# Patient Record
Sex: Male | Born: 1935 | Race: White | Hispanic: No | Marital: Single | Smoking: Current some day smoker
Health system: Southern US, Community
[De-identification: ages and names within clinical notes are randomized; demographics above are authoritative.]

## PROBLEM LIST (undated history)

## (undated) DIAGNOSIS — N400 Enlarged prostate without lower urinary tract symptoms: Secondary | ICD-10-CM

## (undated) DIAGNOSIS — R011 Cardiac murmur, unspecified: Secondary | ICD-10-CM

## (undated) DIAGNOSIS — T7840XA Allergy, unspecified, initial encounter: Secondary | ICD-10-CM

## (undated) DIAGNOSIS — E7800 Pure hypercholesterolemia, unspecified: Secondary | ICD-10-CM

## (undated) DIAGNOSIS — I10 Essential (primary) hypertension: Secondary | ICD-10-CM

## (undated) DIAGNOSIS — H269 Unspecified cataract: Secondary | ICD-10-CM

## (undated) HISTORY — DX: Allergy, unspecified, initial encounter: T78.40XA

## (undated) HISTORY — PX: SKIN CANCER EXCISION: SHX779

## (undated) HISTORY — DX: Unspecified cataract: H26.9

## (undated) HISTORY — PX: CYST EXCISION: SHX5701

## (undated) HISTORY — DX: Cardiac murmur, unspecified: R01.1

## (undated) SURGERY — Surgical Case
Anesthesia: *Unknown

---

## 2000-02-06 ENCOUNTER — Ambulatory Visit (HOSPITAL_COMMUNITY): Admission: RE | Admit: 2000-02-06 | Discharge: 2000-02-06 | Payer: Self-pay | Admitting: Neurology

## 2012-02-05 ENCOUNTER — Other Ambulatory Visit: Payer: Self-pay

## 2012-02-05 MED ORDER — ATORVASTATIN CALCIUM 10 MG PO TABS: 10.0000 mg | ORAL_TABLET | Freq: Every day | ORAL | Status: DC

## 2012-02-16 DIAGNOSIS — D1801 Hemangioma of skin and subcutaneous tissue: Secondary | ICD-10-CM | POA: Diagnosis not present

## 2012-02-16 DIAGNOSIS — Z85828 Personal history of other malignant neoplasm of skin: Secondary | ICD-10-CM | POA: Diagnosis not present

## 2012-03-01 ENCOUNTER — Ambulatory Visit (INDEPENDENT_AMBULATORY_CARE_PROVIDER_SITE_OTHER): Payer: Medicare Other | Admitting: Family Medicine

## 2012-03-01 ENCOUNTER — Encounter: Payer: Self-pay | Admitting: Family Medicine

## 2012-03-01 DIAGNOSIS — J309 Allergic rhinitis, unspecified: Secondary | ICD-10-CM | POA: Insufficient documentation

## 2012-03-01 DIAGNOSIS — I10 Essential (primary) hypertension: Secondary | ICD-10-CM

## 2012-03-01 DIAGNOSIS — R059 Cough, unspecified: Secondary | ICD-10-CM

## 2012-03-01 DIAGNOSIS — R05 Cough: Secondary | ICD-10-CM | POA: Diagnosis not present

## 2012-03-01 DIAGNOSIS — J301 Allergic rhinitis due to pollen: Secondary | ICD-10-CM

## 2012-03-01 DIAGNOSIS — E782 Mixed hyperlipidemia: Secondary | ICD-10-CM | POA: Diagnosis not present

## 2012-03-01 DIAGNOSIS — H9190 Unspecified hearing loss, unspecified ear: Secondary | ICD-10-CM

## 2012-03-01 DIAGNOSIS — E785 Hyperlipidemia, unspecified: Secondary | ICD-10-CM | POA: Insufficient documentation

## 2012-03-01 LAB — COMPREHENSIVE METABOLIC PANEL
AST: 22 U/L (ref 0–37)
Albumin: 4.2 g/dL (ref 3.5–5.2)
Alkaline Phosphatase: 50 U/L (ref 39–117)
BUN: 13 mg/dL (ref 6–23)
Potassium: 5 mEq/L (ref 3.5–5.3)
Sodium: 138 mEq/L (ref 135–145)
Total Bilirubin: 1.5 mg/dL — ABNORMAL HIGH (ref 0.3–1.2)
Total Protein: 7 g/dL (ref 6.0–8.3)

## 2012-03-01 LAB — LIPID PANEL
HDL: 43 mg/dL (ref >39)
LDL Cholesterol: 82 mg/dL (ref 0–99)
VLDL: 20 mg/dL (ref 0–40)

## 2012-03-01 MED ORDER — ATORVASTATIN CALCIUM 10 MG PO TABS: 10.0000 mg | ORAL_TABLET | Freq: Every day | ORAL | Status: DC

## 2012-03-01 MED ORDER — AZELASTINE HCL 0.15 % NA SOLN: 1.0000 | Freq: Two times a day (BID) | NASAL | Status: DC | PRN

## 2012-03-01 MED ORDER — LISINOPRIL-HYDROCHLOROTHIAZIDE 10-12.5 MG PO TABS: 1.0000 | ORAL_TABLET | Freq: Every day | ORAL | Status: DC

## 2012-03-01 NOTE — Progress Notes (Signed)
  Subjective:    Patient ID: Naheim Burgen, male    DOB: 03/06/1926, 78 y.o.   MRN: [id number]  HPI Corbet Hanley is a 78 y.o. male HTN - no new side effects.  Slight cough with colds only, cold past few days.  ONo recent outside BP's.  Weight stable.  Has improved diet choices recently.  Hyperlipidemia - stable 02/28/11 - LDL 88.  No new side effects with meds.    Allergic Rhinitis - sneezing occasionally.  prior on Nasonex, doesn't remember filling Astepro. - would like to try., instruced by eye doctor not to use, due to eye [pressure.   Review of Systems  Constitutional: Negative for fatigue and unexpected weight change.  HENT: Positive for postnasal drip.   Respiratory: Positive for cough. Negative for chest tightness and shortness of breath.        Cough with colds/allergies and PND.  Cardiovascular: Negative for chest pain, palpitations and leg swelling.  Gastrointestinal: Negative for abdominal pain and blood in stool.  Musculoskeletal: Negative for myalgias.  Neurological: Negative for dizziness, syncope, light-headedness and headaches.       Objective:   Physical Exam  Constitutional: He is oriented to person, place, and time. He appears well-developed and well-nourished.  HENT:  Head: Normocephalic and atraumatic.  Right Ear: Tympanic membrane, external ear and ear canal normal.  Left Ear: Tympanic membrane, external ear and ear canal normal.  Nose: Mucosal edema present.  Mouth/Throat: Oropharynx is clear and moist.  Eyes: EOM are normal. Pupils are equal, round, and reactive to light.  Neck: No JVD present. Carotid bruit is not present.  Cardiovascular: Normal rate, regular rhythm and normal heart sounds.   No murmur heard. Pulmonary/Chest: Effort normal and breath sounds normal. He has no rales.  Musculoskeletal: He exhibits no edema.  Neurological: He is alert and oriented to person, place, and time.  Skin: Skin is warm and dry.  Psychiatric: He has a  normal mood and affect. His behavior is normal.          Assessment & Plan:  Lavin Petteway is a 78 y.o. male 1. HTN (hypertension)    2. Hyperlipidemia  Comprehensive metabolic panel, Lipid panel  3. Hearing difficulty    4. Allergic rhinitis     Overall stable BP, check lipids and CMP.  Cont same doses meds.   Suspect cough due to A.R., and currently with URI. Doubt Ace-I cough. .  Change Flonase to Astepro (pt to clear with optho 1st). 1-2 sp/nost qd prn.  Re check 6 months, sooner prn, including if cough persists.

## 2012-03-01 NOTE — Patient Instructions (Signed)
Can stop Nasonex, start Astepro 1 to 2 sprays per nostril up to twice per day as needed for allergies/runny nose/cough.  If not improving cough, recheck with me. Continue your other meds for cholesterol and blood pressure.   Allergic Rhinitis Allergic rhinitis is when the mucous membranes in the nose respond to allergens. Allergens are particles in the air that cause your body to have an allergic reaction. This causes you to release allergic antibodies. Through a chain of events, these eventually cause you to release histamine into the blood stream (hence the use of antihistamines). Although meant to be protective to the body, it is this release that causes your discomfort, such as frequent sneezing, congestion and an itchy runny nose.  CAUSES  The pollen allergens may come from grasses, trees, and weeds. This is seasonal allergic rhinitis, or "hay fever." Other allergens cause year-round allergic rhinitis (perennial allergic rhinitis) such as house dust mite allergen, pet dander and mold spores.  SYMPTOMS   Nasal stuffiness (congestion).   Runny, itchy nose with sneezing and tearing of the eyes.   There is often an itching of the mouth, eyes and ears.  It cannot be cured, but it can be controlled with medications. DIAGNOSIS  If you are unable to determine the offending allergen, skin or blood testing may find it. TREATMENT   Avoid the allergen.   Medications and allergy shots (immunotherapy) can help.   Hay fever may often be treated with antihistamines in pill or nasal spray forms. Antihistamines block the effects of histamine. There are over-the-counter medicines that may help with nasal congestion and swelling around the eyes. Check with your caregiver before taking or giving this medicine.  If the treatment above does not work, there are many new medications your caregiver can prescribe. Stronger medications may be used if initial measures are ineffective. Desensitizing injections can be  used if medications and avoidance fails. Desensitization is when a patient is given ongoing shots until the body becomes less sensitive to the allergen. Make sure you follow up with your caregiver if problems continue. SEEK MEDICAL CARE IF:   You develop fever (more than 100.5 F (38.1 C).   You develop a cough that does not stop easily (persistent).   You have shortness of breath.   You start wheezing.   Symptoms interfere with normal daily activities.  Document Released: 09/05/2001 Document Revised: 11/30/2011 Document Reviewed: 03/17/2009 [facility] Patient Information 2012 [place], [place].

## 2012-05-23 DIAGNOSIS — H40059 Ocular hypertension, unspecified eye: Secondary | ICD-10-CM | POA: Diagnosis not present

## 2012-05-23 DIAGNOSIS — H2510 Age-related nuclear cataract, unspecified eye: Secondary | ICD-10-CM | POA: Diagnosis not present

## 2012-08-23 ENCOUNTER — Encounter: Payer: Self-pay | Admitting: Family Medicine

## 2012-08-23 ENCOUNTER — Ambulatory Visit (INDEPENDENT_AMBULATORY_CARE_PROVIDER_SITE_OTHER): Payer: Medicare Other | Admitting: Family Medicine

## 2012-08-23 VITALS — BP 132/90 | HR 93 | Temp 98.0°F | Resp 18 | Ht 67.0 in | Wt 173.0 lb

## 2012-08-23 DIAGNOSIS — E785 Hyperlipidemia, unspecified: Secondary | ICD-10-CM

## 2012-08-23 DIAGNOSIS — N529 Male erectile dysfunction, unspecified: Secondary | ICD-10-CM

## 2012-08-23 DIAGNOSIS — I10 Essential (primary) hypertension: Secondary | ICD-10-CM | POA: Diagnosis not present

## 2012-08-23 MED ORDER — LISINOPRIL-HYDROCHLOROTHIAZIDE 10-12.5 MG PO TABS: 1.0000 | ORAL_TABLET | Freq: Every day | ORAL | Status: DC

## 2012-08-23 MED ORDER — ATORVASTATIN CALCIUM 10 MG PO TABS: 10.0000 mg | ORAL_TABLET | Freq: Every day | ORAL | Status: DC

## 2012-08-23 MED ORDER — SILDENAFIL CITRATE 50 MG PO TABS: 25.0000 mg | ORAL_TABLET | Freq: Every day | ORAL | Status: DC | PRN

## 2012-08-23 NOTE — Progress Notes (Signed)
  Subjective:    Patient ID: Nicholas Finley, male    DOB: [birth year], 78 y.o.   MRN: [id number]  HPI Nicholas Finley is a 78 y.o. male  HTN - no outside blood pressures. No new side effects.  Has not taken viagra, but rx expired.   Hyperlipidemia - last lipids WNL in March.  No new myalgias or side effects with lipitor.    Occasional sneezing - hasn;t needed nasal spray recently.    Feels well overall. Active, walking for exercise including uphill at parks and no chest pain or new symptoms.   Review of Systems  Constitutional: Negative for fatigue and unexpected weight change.  Eyes: Negative for visual disturbance.  Respiratory: Negative for cough, chest tightness and shortness of breath.   Cardiovascular: Negative for chest pain, palpitations and leg swelling.  Gastrointestinal: Negative for nausea, vomiting, abdominal pain and blood in stool.  Skin: Negative for color change.  Neurological: Negative for dizziness, light-headedness and headaches.       Objective:   Physical Exam  Constitutional: He is oriented to person, place, and time. He appears well-developed and well-nourished.  HENT:  Head: Normocephalic and atraumatic.  Eyes: EOM are normal. Pupils are equal, round, and reactive to light.  Neck: No JVD present. Carotid bruit is not present.  Cardiovascular: Normal rate, regular rhythm and normal heart sounds.   No murmur heard. Pulmonary/Chest: Effort normal and breath sounds normal. He has no rales.  Abdominal: Soft. There is no tenderness. There is no guarding.  Musculoskeletal: He exhibits no edema.  Neurological: He is alert and oriented to person, place, and time.  Skin: Skin is warm and dry.  Psychiatric: He has a normal mood and affect.       Assessment & Plan:  Nicholas Finley is a 78 y.o. male 1. HTN (hypertension)  Comprehensive metabolic panel, lisinopril-hydrochlorothiazide (PRINZIDE,ZESTORETIC) 10-12.5 MG per tablet  2. Hyperlipidemia  Lipid  panel, atorvastatin (LIPITOR) 10 MG tablet  3. ED (erectile dysfunction)  sildenafil (VIAGRA) 50 MG tablet   HTN - borderline control, but no new dose changes.  Check outside bp's for next ov.  Hyperlipidemia - controlled prior.  Recheck levels and cmp - recheck bili for stability. meds refilled.   ED - can try low dose Viagra - SED, orthostatic precautions, and CP precautions reviewed. Understanding expressed.   Plan to schedule annual wellness visit in next months.    Patient Instructions  Keep a record of your blood pressures outside of the office and bring them to the next office visit. Schedule annual medicare physical in the next 6 months. Return to the clinic or go to the nearest emergency room if any of your symptoms worsen or new symptoms occur.

## 2012-08-23 NOTE — Patient Instructions (Signed)
Keep a record of your blood pressures outside of the office and bring them to the next office visit. Schedule annual medicare physical in the next 6 months. Return to the clinic or go to the nearest emergency room if any of your symptoms worsen or new symptoms occur.

## 2012-08-24 LAB — COMPREHENSIVE METABOLIC PANEL
ALT: 20 U/L (ref 0–53)
AST: 20 U/L (ref 0–37)
CO2: 24 mEq/L (ref 19–32)
Sodium: 140 mEq/L (ref 135–145)
Total Bilirubin: 1.7 mg/dL — ABNORMAL HIGH (ref 0.3–1.2)
Total Protein: 7.4 g/dL (ref 6.0–8.3)

## 2012-08-24 LAB — LIPID PANEL
Cholesterol: 170 mg/dL (ref 0–200)
LDL Cholesterol: 97 mg/dL (ref 0–99)
Total CHOL/HDL Ratio: 3.8 Ratio
VLDL: 28 mg/dL (ref 0–40)

## 2012-08-30 ENCOUNTER — Ambulatory Visit: Payer: Medicare Other | Admitting: Family Medicine

## 2013-01-21 DIAGNOSIS — H2510 Age-related nuclear cataract, unspecified eye: Secondary | ICD-10-CM | POA: Diagnosis not present

## 2013-01-21 DIAGNOSIS — H40059 Ocular hypertension, unspecified eye: Secondary | ICD-10-CM | POA: Diagnosis not present

## 2013-02-17 DIAGNOSIS — Z85828 Personal history of other malignant neoplasm of skin: Secondary | ICD-10-CM | POA: Diagnosis not present

## 2013-02-17 DIAGNOSIS — L570 Actinic keratosis: Secondary | ICD-10-CM | POA: Diagnosis not present

## 2013-04-21 ENCOUNTER — Other Ambulatory Visit: Payer: Self-pay | Admitting: Family Medicine

## 2013-05-21 ENCOUNTER — Other Ambulatory Visit: Payer: Self-pay | Admitting: Family Medicine

## 2013-05-21 NOTE — Telephone Encounter (Signed)
Needs office visit.

## 2013-07-11 ENCOUNTER — Encounter (HOSPITAL_COMMUNITY): Payer: Self-pay

## 2013-07-11 ENCOUNTER — Emergency Department (HOSPITAL_COMMUNITY)
Admission: EM | Admit: 2013-07-11 | Discharge: 2013-07-11 | Disposition: A | Discharge status: Home / Self Care | Payer: Medicare Other | Attending: Emergency Medicine | Admitting: Emergency Medicine

## 2013-07-11 ENCOUNTER — Emergency Department (HOSPITAL_COMMUNITY): Payer: Medicare Other

## 2013-07-11 DIAGNOSIS — I700 Atherosclerosis of aorta: Secondary | ICD-10-CM | POA: Diagnosis not present

## 2013-07-11 DIAGNOSIS — Z79899 Other long term (current) drug therapy: Secondary | ICD-10-CM | POA: Diagnosis not present

## 2013-07-11 DIAGNOSIS — Z87891 Personal history of nicotine dependence: Secondary | ICD-10-CM | POA: Diagnosis not present

## 2013-07-11 DIAGNOSIS — N3090 Cystitis, unspecified without hematuria: Secondary | ICD-10-CM

## 2013-07-11 DIAGNOSIS — R350 Frequency of micturition: Secondary | ICD-10-CM | POA: Diagnosis not present

## 2013-07-11 DIAGNOSIS — R109 Unspecified abdominal pain: Secondary | ICD-10-CM | POA: Diagnosis not present

## 2013-07-11 DIAGNOSIS — Z87448 Personal history of other diseases of urinary system: Secondary | ICD-10-CM | POA: Diagnosis not present

## 2013-07-11 DIAGNOSIS — I10 Essential (primary) hypertension: Secondary | ICD-10-CM | POA: Insufficient documentation

## 2013-07-11 DIAGNOSIS — Z7982 Long term (current) use of aspirin: Secondary | ICD-10-CM | POA: Insufficient documentation

## 2013-07-11 DIAGNOSIS — E7800 Pure hypercholesterolemia, unspecified: Secondary | ICD-10-CM | POA: Insufficient documentation

## 2013-07-11 HISTORY — DX: Benign prostatic hyperplasia without lower urinary tract symptoms: N40.0

## 2013-07-11 HISTORY — DX: Essential (primary) hypertension: I10

## 2013-07-11 HISTORY — DX: Pure hypercholesterolemia, unspecified: E78.00

## 2013-07-11 LAB — POCT I-STAT, CHEM 8
BUN: 20 mg/dL (ref 6–23)
Hemoglobin: 15.6 g/dL (ref 13.0–17.0)
Potassium: 3.6 mEq/L (ref 3.5–5.1)
Sodium: 138 mEq/L (ref 135–145)
TCO2: 20 mmol/L (ref 0–100)

## 2013-07-11 LAB — URINALYSIS, ROUTINE W REFLEX MICROSCOPIC
Glucose, UA: NEGATIVE mg/dL
Leukocytes, UA: NEGATIVE
Protein, ur: NEGATIVE mg/dL
Specific Gravity, Urine: 1.028 (ref 1.005–1.030)
Urobilinogen, UA: 1 mg/dL (ref 0.0–1.0)

## 2013-07-11 LAB — CBC WITH DIFFERENTIAL/PLATELET
Basophils Absolute: 0 10*3/uL (ref 0.0–0.1)
Basophils Relative: 0 % (ref 0–1)
Eosinophils Absolute: 0 10*3/uL (ref 0.0–0.7)
Eosinophils Relative: 0 % (ref 0–5)
HCT: 43.1 % (ref 39.0–52.0)
Hemoglobin: 14.8 g/dL (ref 13.0–17.0)
MCH: 31.2 pg (ref 26.0–34.0)
MCHC: 34.3 g/dL (ref 30.0–36.0)
MCV: 90.9 fL (ref 78.0–100.0)
Monocytes Absolute: 1.2 10*3/uL — ABNORMAL HIGH (ref 0.1–1.0)
Monocytes Relative: 8 % (ref 3–12)
RDW: 14.1 % (ref 11.5–15.5)

## 2013-07-11 LAB — URINE MICROSCOPIC-ADD ON

## 2013-07-11 MED ORDER — CEPHALEXIN 500 MG PO CAPS: 500.0000 mg | ORAL_CAPSULE | Freq: Four times a day (QID) | ORAL | Status: DC

## 2013-07-11 MED ORDER — CEPHALEXIN 500 MG PO CAPS
500.0000 mg | ORAL_CAPSULE | Freq: Once | ORAL | Status: AC
  Administered 2013-07-11: 500 mg via ORAL
  Filled 2013-07-11: qty 1

## 2013-07-11 MED ORDER — TRAMADOL HCL 50 MG PO TABS: 50.0000 mg | ORAL_TABLET | Freq: Four times a day (QID) | ORAL | Status: DC | PRN

## 2013-07-11 MED ORDER — KETOROLAC TROMETHAMINE 60 MG/2ML IM SOLN
60.0000 mg | Freq: Once | INTRAMUSCULAR | Status: AC
  Administered 2013-07-11: 60 mg via INTRAMUSCULAR
  Filled 2013-07-11: qty 2

## 2013-07-11 NOTE — ED Provider Notes (Signed)
History    CSN: [id number] Arrival date & time 07/11/13  0112  First MD Initiated Contact with Patient 07/11/13 0130     Chief Complaint  Patient presents with  . Flank Pain   (Consider location/radiation/quality/duration/timing/severity/associated sxs/prior Treatment) Patient is a 79 y.o. male presenting with flank pain. The history is provided by the patient. No language interpreter was used.  Flank Pain This is a new problem. The current episode started 2 days ago. The problem occurs constantly. The problem has not changed since onset.Pertinent negatives include no chest pain, no abdominal pain, no headaches and no shortness of breath. Nothing aggravates the symptoms. Nothing relieves the symptoms. He has tried nothing for the symptoms. The treatment provided no relief.   Past Medical History  Diagnosis Date  . Enlarged prostate   . Hypertension   . Hypercholesteremia    Past Surgical History  Procedure Laterality Date  . Skin cancer excision    . Cyst excision     History reviewed. No pertinent family history. History  Substance Use Topics  . Smoking status: Former Smoker    Types: Cigarettes, Pipe, Software engineer  . Smokeless tobacco: Not on file  . Alcohol Use: No    Review of Systems  Respiratory: Negative for shortness of breath.   Cardiovascular: Negative for chest pain.  Gastrointestinal: Negative for abdominal pain.  Genitourinary: Positive for frequency and flank pain.  Neurological: Negative for headaches.  All other systems reviewed and are negative.    Allergies  Review of patient's allergies indicates no known allergies.  Home Medications   Current Outpatient Rx  Name  Route  Sig  Dispense  Refill  . aspirin 81 MG tablet   Oral   Take 81 mg by mouth daily.         Marland Kitchen atorvastatin (LIPITOR) 10 MG tablet      take 1 tablet by mouth once daily   90 tablet   0     Needs office visit   . lisinopril-hydrochlorothiazide (PRINZIDE,ZESTORETIC)  10-12.5 MG per tablet      take 1 tablet by mouth once daily   90 tablet   0     Needs office visit   . sildenafil (VIAGRA) 50 MG tablet   Oral   Take 25 mg by mouth daily as needed for erectile dysfunction.         . ASTEPRO 0.15 % SOLN      instill 1 spray into each nostril twice a day if needed   30 mL   3    BP 172/101  Pulse 88  Temp(Src) 98.7 F (37.1 C)  Resp 20  SpO2 98% Physical Exam  Constitutional: He is oriented to person, place, and time. He appears well-developed and well-nourished. No distress.  HENT:  Head: Normocephalic and atraumatic.  Mouth/Throat: Oropharynx is clear and moist.  Eyes: Conjunctivae are normal. Pupils are equal, round, and reactive to light.  Neck: Normal range of motion. Neck supple.  Cardiovascular: Normal rate, regular rhythm and intact distal pulses.   Pulmonary/Chest: Effort normal and breath sounds normal. He has no wheezes. He has no rales.  Abdominal: Soft. Bowel sounds are normal. There is no tenderness. There is no rebound and no guarding.  Musculoskeletal: Normal range of motion.  Neurological: He is alert and oriented to person, place, and time.  Skin: Skin is warm and dry.  Psychiatric: He has a normal mood and affect.    ED Course  Procedures (including critical  care time) Labs Reviewed  CBC WITH DIFFERENTIAL - Abnormal; Notable for the following:    WBC 14.5 (*)    Neutro Abs 8.1 (*)    Lymphs Abs 5.1 (*)    Monocytes Absolute 1.2 (*)    All other components within normal limits  URINALYSIS, ROUTINE W REFLEX MICROSCOPIC - Abnormal; Notable for the following:    Hgb urine dipstick SMALL (*)    All other components within normal limits  URINE MICROSCOPIC-ADD ON - Abnormal; Notable for the following:    Bacteria, UA FEW (*)    All other components within normal limits  POCT I-STAT, CHEM 8 - Abnormal; Notable for the following:    Glucose, Bld 137 (*)    Calcium, Ion 0.97 (*)    All other components within  normal limits   No results found. No diagnosis found.  MDM  Will treat for cystitis.  Follow up with your doctor and urology as needed for ongoing care.  Return for fevers vomiting or worsening pain.  Patient verbalizes understanding and agrees to follow up  Laverda Stribling Smitty Cords, MD 07/11/13 1610

## 2013-07-11 NOTE — ED Notes (Signed)
Per pt, left flank pain x 2 days.  Unknown muscle strain.  No hx of stones.  Possible increase in urination.  Pt also has prostate hx.  No fever, n/v.

## 2013-07-17 DIAGNOSIS — R39198 Other difficulties with micturition: Secondary | ICD-10-CM | POA: Diagnosis not present

## 2013-07-17 DIAGNOSIS — R351 Nocturia: Secondary | ICD-10-CM | POA: Diagnosis not present

## 2013-07-17 DIAGNOSIS — M549 Dorsalgia, unspecified: Secondary | ICD-10-CM | POA: Diagnosis not present

## 2013-08-29 ENCOUNTER — Ambulatory Visit: Payer: Medicare Other

## 2013-08-29 ENCOUNTER — Ambulatory Visit (INDEPENDENT_AMBULATORY_CARE_PROVIDER_SITE_OTHER): Payer: Medicare Other | Admitting: Family Medicine

## 2013-08-29 VITALS — BP 142/70 | HR 94 | Temp 98.9°F | Resp 17 | Ht 66.5 in | Wt 177.0 lb

## 2013-08-29 DIAGNOSIS — S9030XA Contusion of unspecified foot, initial encounter: Secondary | ICD-10-CM | POA: Diagnosis not present

## 2013-08-29 DIAGNOSIS — S93409A Sprain of unspecified ligament of unspecified ankle, initial encounter: Secondary | ICD-10-CM | POA: Diagnosis not present

## 2013-08-29 DIAGNOSIS — M25561 Pain in right knee: Secondary | ICD-10-CM

## 2013-08-29 DIAGNOSIS — S8000XA Contusion of unspecified knee, initial encounter: Secondary | ICD-10-CM

## 2013-08-29 DIAGNOSIS — M25569 Pain in unspecified knee: Secondary | ICD-10-CM | POA: Diagnosis not present

## 2013-08-29 DIAGNOSIS — S8001XA Contusion of right knee, initial encounter: Secondary | ICD-10-CM

## 2013-08-29 DIAGNOSIS — S9031XA Contusion of right foot, initial encounter: Secondary | ICD-10-CM

## 2013-08-29 NOTE — Patient Instructions (Signed)
Ankle brace as needed for next week. Be careful with twisting or any quick movements.ice or heat to affected areas if needed. Recheck with Dr. Cleta Alberts next week. Return to the clinic or go to the nearest emergency room if any of your symptoms worsen or new symptoms occur.

## 2013-08-29 NOTE — Progress Notes (Signed)
Subjective:    Patient ID: Nicholas Finley, male    DOB: 10-29-1926, 79 y.o.   MRN: [id number]  HPI Nicholas Finley is a 79 y.o. male  Larey Seat onto R knee while walking up steps 10 days ago. Lost footing. Fell onto floor - ?twisted or struck R knee.  Able to walk ok without soreness until next day. Noticed bruising into foot a day or two later. Knee still feels swollen in the front. Tx: hot water bottle to outside of knee at times.   Slight bruising into toes, but not hurting in foot. Able to walk without difficulty. On asa qd.   R ankle swollen, slightly sore about 2 days after fall. Able to walk without difficulty. No hip pain.   Review of Systems  Musculoskeletal: Positive for joint swelling and arthralgias. Negative for gait problem.  Skin: Positive for color change (R foot. ).      Objective:   Physical Exam  Vitals reviewed. Constitutional: He is oriented to person, place, and time. He appears well-developed and well-nourished. No distress.  Pulmonary/Chest: Effort normal.  Musculoskeletal:       Right hip: He exhibits normal range of motion, normal strength and no tenderness.       Left hip: He exhibits normal range of motion, normal strength and no tenderness.       Right knee: He exhibits swelling (rounded soft tissue swelling.anterior to tibial tuberosity. no apparent joint effusion. ). He exhibits normal range of motion, no LCL laxity, normal patellar mobility and no MCL laxity. Tenderness found. Lateral joint line (minimal ttp anterior aspect lateral jt. line ) tenderness noted. No MCL, no LCL and no patellar tendon (intact - able to hold knee in extension. ) tenderness noted.       Right ankle: He exhibits normal range of motion and no deformity. Tenderness. Lateral malleolus (proximal aspect, sts inferior to malleolus. ) tenderness found. No medial malleolus, no head of 5th metatarsal and no proximal fibula tenderness found. Achilles tendon normal. Achilles tendon exhibits  no pain and no defect.       Right lower leg: He exhibits bony tenderness (disatl tibia approx 3-4 cm proximal to malleolus. fibula nt. ) and swelling (calf nt, achilles nt. ).       Right foot: He exhibits normal range of motion, no tenderness and no bony tenderness.       Feet:  Neurological: He is alert and oriented to person, place, and time.  nvi distally.   Skin: Skin is warm, dry and intact.     Psychiatric: He has a normal mood and affect. His behavior is normal.     [facility] reading (PRIMARY) by  Dr. Neva Seat: R ankle: NAD, no apparent fx R. Knee: NAD.  Degenerative changes.  R tib fib: NAD, no apparent fx.  R foot: no apparent fx.      Assessment & Plan:  Nicholas Finley is a 79 y.o. male Pain in joint, lower leg, right - Plan: DG Knee Complete 4 Views Right, DG Tibia/Fibula Right, DG Ankle Complete Right, DG Foot Complete Right  Knee contusion, right, initial encounter  Traumatic ecchymosis of foot, right, initial encounter  Sprain of ankle, unspecified site  Suspected R knee contusion, then dependent ecchymosis to foot. Also some medial ankle ttp - mild sprain possible.  Able to weight bear without difficulty.  No apparent fx on xray, but will have rechecked in 1 week with Dr. Cleta Alberts as I will be out of town. RTC  precautions given. sweedo brace as needed. Tylenol if needed.   Patient Instructions  Ankle brace as needed for next week. Be careful with twisting or any quick movements.ice or heat to affected areas if needed. Recheck with Dr. Cleta Alberts next week. Return to the clinic or go to the nearest emergency room if any of your symptoms worsen or new symptoms occur.

## 2013-09-05 ENCOUNTER — Ambulatory Visit (INDEPENDENT_AMBULATORY_CARE_PROVIDER_SITE_OTHER): Payer: Medicare Other | Admitting: Emergency Medicine

## 2013-09-05 VITALS — BP 128/72 | HR 83 | Temp 98.6°F | Resp 17 | Ht 66.5 in | Wt 178.0 lb

## 2013-09-05 DIAGNOSIS — Z23 Encounter for immunization: Secondary | ICD-10-CM | POA: Diagnosis not present

## 2013-09-05 DIAGNOSIS — IMO0002 Reserved for concepts with insufficient information to code with codable children: Secondary | ICD-10-CM

## 2013-09-05 DIAGNOSIS — S8001XS Contusion of right knee, sequela: Secondary | ICD-10-CM

## 2013-09-05 NOTE — Progress Notes (Signed)
  Subjective:    Patient ID: Nicholas Finley, male    DOB: [birth year], 79 y.o.   MRN: [id number]  HPI patient here to followup injury to his right leg with contusion to his knee foot and ankle. He is doing significantly better. X-rays done of this entire area did not reveal any signs of fracture. He has significant swelling over the tibial tuberosity    Review of Systems     Objective:   Physical Exam e area is becoming more fluctuant. The bruising noted in the ankle and foot is essentially resolved. xamination of the leg reveals significant swelling over the tibial tuberosity. There is minimal redness noted.         Assessment & Plan:  Patient doing well regarding contusion to his right leg especially over the tibial tuberosity. Do not feel any specific treatment is indicated at the present time. Will recheck if he develops any redness or worsening swelling of the area.

## 2013-09-17 DIAGNOSIS — H40059 Ocular hypertension, unspecified eye: Secondary | ICD-10-CM | POA: Diagnosis not present

## 2013-12-17 ENCOUNTER — Other Ambulatory Visit: Payer: Self-pay | Admitting: Physician Assistant

## 2013-12-17 NOTE — Telephone Encounter (Signed)
Spoke to pt. He will be in to see Dr. Ulla Gallo 2

## 2013-12-26 ENCOUNTER — Ambulatory Visit (INDEPENDENT_AMBULATORY_CARE_PROVIDER_SITE_OTHER): Payer: Medicare Other | Admitting: Family Medicine

## 2013-12-26 ENCOUNTER — Encounter: Payer: Self-pay | Admitting: Family Medicine

## 2013-12-26 VITALS — BP 124/80 | HR 87 | Temp 99.0°F | Resp 16 | Ht 66.5 in | Wt 180.0 lb

## 2013-12-26 DIAGNOSIS — I10 Essential (primary) hypertension: Secondary | ICD-10-CM

## 2013-12-26 DIAGNOSIS — E785 Hyperlipidemia, unspecified: Secondary | ICD-10-CM

## 2013-12-26 LAB — LIPID PANEL
CHOLESTEROL: 163 mg/dL (ref 0–200)
HDL: 44 mg/dL (ref >39)
LDL Cholesterol: 88 mg/dL (ref 0–99)
TRIGLYCERIDES: 153 mg/dL — AB (ref <150)
Total CHOL/HDL Ratio: 3.7 Ratio
VLDL: 31 mg/dL (ref 0–40)

## 2013-12-26 LAB — COMPREHENSIVE METABOLIC PANEL
ALBUMIN: 4.2 g/dL (ref 3.5–5.2)
ALT: 21 U/L (ref 0–53)
AST: 18 U/L (ref 0–37)
Alkaline Phosphatase: 50 U/L (ref 39–117)
BILIRUBIN TOTAL: 1.6 mg/dL — AB (ref 0.3–1.2)
BUN: 13 mg/dL (ref 6–23)
CALCIUM: 9.2 mg/dL (ref 8.4–10.5)
CHLORIDE: 102 meq/L (ref 96–112)
CO2: 25 meq/L (ref 19–32)
Creat: 0.75 mg/dL (ref 0.50–1.35)
GLUCOSE: 119 mg/dL — AB (ref 70–99)
POTASSIUM: 3.7 meq/L (ref 3.5–5.3)
SODIUM: 140 meq/L (ref 135–145)
TOTAL PROTEIN: 7.4 g/dL (ref 6.0–8.3)

## 2013-12-26 MED ORDER — ATORVASTATIN CALCIUM 10 MG PO TABS: ORAL_TABLET | ORAL | Status: DC

## 2013-12-26 MED ORDER — LISINOPRIL-HYDROCHLOROTHIAZIDE 10-12.5 MG PO TABS: ORAL_TABLET | ORAL | Status: DC

## 2013-12-26 NOTE — Patient Instructions (Signed)
We will call you to schedule physical. You should receive a call or letter about your lab results within the next week to 10 days.

## 2013-12-26 NOTE — Progress Notes (Addendum)
Subjective:    Patient ID: Nicholas Finley, male    DOB: November 15, 1926, 80 y.o.   MRN: [id number] This chart was scribed for Merri Ray, MD by Vernell Barrier, Medical Scribe. This patient's care was started at 8:45 AM.  HPI HPI Comments: Nicholas Finley is a 80 y.o. male who presents to the [facility] for refills of medication for HTN and hyperlipidemia. Pt states he has enough medication for about 30 days. Pt denies any associated myalgias/pain or side effects from HTN or cholesterol medication. Pt has not had anything to eat or drink in the last 14 hours. Denies light-headedness, chest pain, belly pain, hematochezia, weakness, SOB, or dizziness.   HTN: Last creatinine: 0.78 in Aug of 2013.   Hyperlipidemia: Last lipids in Aug of 2013 were controlled. Did note elevated bilirubin at 1.7 at that office visit. No abd pain.    Patient Active Problem List   Diagnosis Date Noted  . HTN (hypertension) 03/01/2012  . Hyperlipidemia 03/01/2012  . Hearing difficulty 03/01/2012  . Allergic rhinitis 03/01/2012   Past Medical History  Diagnosis Date  . Enlarged prostate   . Hypertension   . Hypercholesteremia   . Allergy   . Cataract    Past Surgical History  Procedure Laterality Date  . Skin cancer excision    . Cyst excision     No Known Allergies Prior to Admission medications   Medication Sig Start Date End Date Taking? Authorizing Provider  aspirin 81 MG tablet Take 81 mg by mouth daily.   Yes Historical Provider, MD  ASTEPRO 0.15 % SOLN instill 1 spray into each nostril twice a day if needed 04/21/13  Yes Heather M Marte, PA-C  atorvastatin (LIPITOR) 10 MG tablet take 1 tablet by mouth once daily 05/21/13  Yes Ryan M Dunn, PA-C  lisinopril-hydrochlorothiazide (PRINZIDE,ZESTORETIC) 10-12.5 MG per tablet take 1 tablet by mouth once daily 12/17/13  Yes Sarah Alleen Borne, PA-C  cephALEXin (KEFLEX) 500 MG capsule Take 1 capsule (500 mg total) by mouth 4 (four)  times daily. 07/11/13   April K Palumbo-Rasch, MD  sildenafil (VIAGRA) 50 MG tablet Take 25 mg by mouth daily as needed for erectile dysfunction.    Historical Provider, MD  traMADol (ULTRAM) 50 MG tablet Take 1 tablet (50 mg total) by mouth every 6 (six) hours as needed for pain. 07/11/13   April Alfonso Patten, MD   History   Social History  . Marital Status: Single    Spouse Name: N/A    Number of Children: N/A  . Years of Education: N/A   Occupational History  . Not on file.   Social History Main Topics  . Smoking status: Former Smoker    Types: Cigarettes, Pipe, Landscape architect  . Smokeless tobacco: Not on file  . Alcohol Use: No  . Drug Use: No  . Sexual Activity: Not on file   Other Topics Concern  . Not on file   Social History Narrative  . No narrative on file    Review of Systems  Constitutional: Negative for fatigue and unexpected weight change.  Eyes: Negative for visual disturbance.  Respiratory: Negative for cough, chest tightness and shortness of breath.   Cardiovascular: Negative for chest pain, palpitations and leg swelling.  Gastrointestinal: Negative for abdominal pain and blood in stool.  Neurological: Negative for dizziness, weakness, light-headedness and headaches.       Objective:   Physical Exam  Vitals reviewed. Constitutional: He is oriented to person, place,  and time. He appears well-developed and well-nourished.  HENT:  Head: Normocephalic and atraumatic.  Eyes: EOM are normal. Pupils are equal, round, and reactive to light.  Neck: No JVD present. Carotid bruit is not present.  Cardiovascular: Normal rate and regular rhythm.   Murmur heard. 2 out of 6 heart murmur at LUSB  Pulmonary/Chest: Effort normal and breath sounds normal. He has no rales.  Musculoskeletal: He exhibits no edema.  Neurological: He is alert and oriented to person, place, and time.  Skin: Skin is warm and dry.  Psychiatric: He has a normal mood and affect.    Filed  Vitals:   12/26/13 0817  BP: 124/80  Pulse: 87  Temp: 99 F (37.2 C)  TempSrc: Oral  Resp: 16  Height: 5' 6.5" (1.689 m)  Weight: 180 lb (81.647 kg)  SpO2: 95%      Assessment & Plan:   Nicholas Finley is a 80 y.o. male Hypertension - Plan: Comprehensive metabolic panel, Lipid panel, lisinopril-hydrochlorothiazide (PRINZIDE,ZESTORETIC) 10-12.5 MG per tablet - controlled. Refilled meds, check CMP.   Hyperlipidemia - Plan: Comprehensive metabolic panel, Lipid panel, atorvastatin (LIPITOR) 10 MG tablet -prior controlled. Cont Lipitor at same dose. Lipids pending.   Plan on scheduling medicare physical. He also plans to check into part D coverage.   Meds ordered this encounter  Medications  . lisinopril-hydrochlorothiazide (PRINZIDE,ZESTORETIC) 10-12.5 MG per tablet    Sig: take 1 tablet by mouth once daily    Dispense:  90 tablet    Refill:  1  . atorvastatin (LIPITOR) 10 MG tablet    Sig: take 1 tablet by mouth once daily    Dispense:  90 tablet    Refill:  1   Patient Instructions  We will call you to schedule physical. You should receive a call or letter about your lab results within the next week to 10 days.

## 2013-12-31 ENCOUNTER — Telehealth: Payer: Self-pay | Admitting: Family Medicine

## 2013-12-31 NOTE — Telephone Encounter (Signed)
Appt has been made for April 06, 2014.

## 2013-12-31 NOTE — Telephone Encounter (Signed)
Message copied by Chinita Pester on Wed Dec 31, 2013  8:29 AM ------      Message from: [place], [place] R      Created: Fri Dec 26, 2013  8:59 AM       Please schedule with Carlota Raspberry in next 3-6 months for physical (medicare physical - 6min).  ------

## 2014-02-16 DIAGNOSIS — L819 Disorder of pigmentation, unspecified: Secondary | ICD-10-CM | POA: Diagnosis not present

## 2014-02-16 DIAGNOSIS — L570 Actinic keratosis: Secondary | ICD-10-CM | POA: Diagnosis not present

## 2014-02-16 DIAGNOSIS — L820 Inflamed seborrheic keratosis: Secondary | ICD-10-CM | POA: Diagnosis not present

## 2014-02-16 DIAGNOSIS — D1801 Hemangioma of skin and subcutaneous tissue: Secondary | ICD-10-CM | POA: Diagnosis not present

## 2014-02-16 DIAGNOSIS — L821 Other seborrheic keratosis: Secondary | ICD-10-CM | POA: Diagnosis not present

## 2014-02-16 DIAGNOSIS — Z85828 Personal history of other malignant neoplasm of skin: Secondary | ICD-10-CM | POA: Diagnosis not present

## 2014-04-06 ENCOUNTER — Encounter: Payer: Self-pay | Admitting: Family Medicine

## 2014-04-06 ENCOUNTER — Ambulatory Visit (INDEPENDENT_AMBULATORY_CARE_PROVIDER_SITE_OTHER): Payer: Medicare Other | Admitting: Family Medicine

## 2014-04-06 VITALS — BP 120/74 | HR 83 | Temp 98.9°F | Resp 16 | Ht 66.0 in | Wt 177.6 lb

## 2014-04-06 DIAGNOSIS — R4182 Altered mental status, unspecified: Secondary | ICD-10-CM

## 2014-04-06 DIAGNOSIS — R6250 Unspecified lack of expected normal physiological development in childhood: Secondary | ICD-10-CM

## 2014-04-06 DIAGNOSIS — R739 Hyperglycemia, unspecified: Secondary | ICD-10-CM

## 2014-04-06 DIAGNOSIS — Z0000 Encounter for general adult medical examination without abnormal findings: Secondary | ICD-10-CM

## 2014-04-06 DIAGNOSIS — R7309 Other abnormal glucose: Secondary | ICD-10-CM | POA: Diagnosis not present

## 2014-04-06 DIAGNOSIS — I10 Essential (primary) hypertension: Secondary | ICD-10-CM | POA: Diagnosis not present

## 2014-04-06 DIAGNOSIS — H9190 Unspecified hearing loss, unspecified ear: Secondary | ICD-10-CM

## 2014-04-06 DIAGNOSIS — E785 Hyperlipidemia, unspecified: Secondary | ICD-10-CM

## 2014-04-06 LAB — GLUCOSE, POCT (MANUAL RESULT ENTRY): POC Glucose: 89 mg/dl (ref 70–99)

## 2014-04-06 LAB — POCT GLYCOSYLATED HEMOGLOBIN (HGB A1C): Hemoglobin A1C: 6.2

## 2014-04-06 NOTE — Patient Instructions (Addendum)
I would recommend continuing your Lipitor as long as you are not having any side effects. You may not necessarily need to get the shingles vaccine at this point as it may be less effective and due to cost. Work on removing some of the loose items that could cause tripping at home and if needed, a handle near the shower for stability. Also recommend getting items that are on high shelves requiring ladder use on lower shelves to decreased risk of falls. I will refer you to the Neurologist to discuss the episode you had a few months ago, but if any of these symptoms return, come back to the office or go to the Emergency Room.   Your blood sugar test was normal in the office today, but a three month average was slightly elevated. No new medications at this point but continue to watch diet and walk for exercise. Recheck with Dr. Carlota Raspberry in the next 3-6 months, sooner if anything worsens. We will likely discuss urination symptoms next visit and should have the other pneumonia vaccine for you then.    Keeping you healthy  Get these tests  Blood pressure- Have your blood pressure checked once a year by your healthcare provider.  Normal blood pressure is 120/80  Weight- Have your body mass index (BMI) calculated to screen for obesity.  BMI is a measure of body fat based on height and weight. You can also calculate your own BMI at [email or web address].  Cholesterol- Have your cholesterol checked every year.  Diabetes- Have your blood sugar checked regularly if you have high blood pressure, high cholesterol, have a family history of diabetes or if you are overweight.  Screening for Colon Cancer- Colonoscopy starting at age 59.  Screening may begin sooner depending on your family history and other health conditions. Follow up colonoscopy as directed by your Gastroenterologist. Take these medicines  Aspirin- One aspirin daily can help prevent Heart disease and Stroke.  Flu shot- Every fall.  Tetanus-  Every 10 years. - I would recommend a "TDAP" but check with your insurance as you may need to pay for this out of pocket.   Pneumonia shot- Once after the age of 14; if you are younger than 78, ask your healthcare provider if you need a Pneumonia shot.  Take these steps  Don't smoke- If you do smoke, talk to your doctor about quitting.  For tips on how to quit, go to www.smokefree.gov or call 1-800-QUIT-NOW.  Be physically active- Exercise 5 days a week for at least 30 minutes.  If you are not already physically active start slow and gradually work up to 30 minutes of moderate physical activity.  Examples of moderate activity include walking briskly, mowing the yard, dancing, swimming, bicycling, etc.  Eat a healthy diet- Eat a variety of healthy food such as fruits, vegetables, low fat milk, low fat cheese, yogurt, lean meant, poultry, fish, beans, tofu, etc. For more information go to www.thenutritionsource.org  Drink alcohol in moderation- Limit alcohol intake to less than two drinks a day. Never drink and drive.  Dentist- Brush and floss twice daily; visit your dentist twice a year.  Depression- Your emotional health is as important as your physical health. If you're feeling down, or losing interest in things you would normally enjoy please talk to your healthcare provider.  Eye exam- Visit your eye doctor every year.  Safe sex- If you may be exposed to a sexually transmitted infection, use a condom.  Seat belts- Seat belts can  save your life; always wear one.  Smoke/Carbon Monoxide detectors- These detectors need to be installed on the appropriate level of your home.  Replace batteries at least once a year.  Skin cancer- When out in the sun, cover up and use sunscreen 15 SPF or higher.  Violence- If anyone is threatening you, please tell your healthcare provider.  Living Will/ Health care power of attorney- Speak with your healthcare provider and family.

## 2014-04-06 NOTE — Progress Notes (Addendum)
Subjective:    Patient ID: Nicholas Finley, male    DOB: 1926-03-16, 80 y.o.   MRN: [id number]  This chart was scribed for [name], MD by Maree Erie, ED Scribe.  Authored by Janeann Forehand. MD - unable to change in [facility].   Chief Complaint  Patient presents with   Annual Exam    AWV-S - per patient no refills needed    PCP: No primary provider on file.   HPI  Nicholas Finley is a 80 y.o. male who presents to office for a subsequent Medicare Annual Wellness visit.  History of HTN and Hyperlipidemia: Takes Lisinopril-HCTZ and Lipitor, as well as allergic rhinitis with episodic Astepro. Most recent labs in January, 2015, with stable cholesterol.   Hyperglycemia: borderline glucose at 119. Glucose was also elevated at 137 eight months ago. Will follow up at the next visit.    Health Maintenance:   Pneumonia Vaccine: 07/2007, will repeat at next visit   Hearing Screening: Patient has history of hearing difficulty. He has an OTC amplifier. He states its "better than nothing." He has been seen by an Audiologist who did not recommend the hear   Depression Screening: 0/2 on PHQ2 score; Beck depression score of 8 (normal ups and downs)   Fall Screening: 1 fall in the past year with injury to right knee and ankle, See notes from September, 2014. Lost footing while walking up steps. Otherwise stable with walking and no other falls. He states that the injuries are healing well but has occasional residual soreness. Home safety evaluation, see scanned copy. Few borderline responses, including things to step over in the home in almost every room, sometimes concerned may be slipping in shower when getting out, rare difficulty with steps or stairs, rare unsteadiness on gravel or uneven surfaces and sometimes uses stepstool to obtain things out of reach. He has a towel rack in his bathroom but is unsure if it is stable enough to catch him. He also has a railing near his tub that he got  for his mother. However, he states he doesn't use it.   Tetanus: 07/2007, TD   Vision Screening: see below   Visual Acuity Screening   Right eye Left eye Both eyes  Without correction:     With correction: 20/50 20/30 20/40      Colonoscopy: within the past two years   Prostate Screening: Last PSA test in 2011, normal. He states he is ok with stopping the screening. He reports frequency and urgency that has been gradually getting worse for about 15-20 years. He has to get up a couple times a night every night, which is worsened if he drinks too much. It has not changed much in the past few years. It does not affect his ability to function during the day. He denies dysuria or hematuria.    Advanced Directives: He has a living will in his trust. He states his cousin is going to be making decisions if he is unable to make medical decisions for himself. He states that in his living will he is full code.    Zoster Vaccine: He is not interested in getting the vaccination currently because he does not believe it is covered by his insurance and it is too expensive. He will look into getting it covered so he can get it   History of Basal Cell Carcinoma: He has had a few spots frozen on his head. He was last seen by Dermatology early 2015.  Social History: He states he smoked cigars for 40 years but quit in 2006.   Memory Loss: He denies any decrease in functioning in life. He is sometimes unable to remember names of people or places for a few minutes. He is able to balance his checkbook, grocery shop, pay bills and other daily tasks. He states that about a month ago he "went out" for about twenty seconds and does not remember what he said to a friend. He states that it felt like he was in a trace but has no memory of the event. He believes it looked like he was staring into space. It was during the middle of the day. He denies a prior similar episode. He denies headaches, dizziness, lightheadedness or  focal weakness. He denies a history of seizures. He takes a 81 mg aspirin daily.    Patient Active Problem List   Diagnosis Date Noted   HTN (hypertension) 03/01/2012   Hyperlipidemia 03/01/2012   Hearing difficulty 03/01/2012   Allergic rhinitis 03/01/2012   Past Medical History  Diagnosis Date   Enlarged prostate    Hypertension    Hypercholesteremia    Allergy    Cataract    Heart murmur    Past Surgical History  Procedure Laterality Date   Skin cancer excision     Cyst excision      pilonidal   No Known Allergies Prior to Admission medications   Medication Sig Start Date End Date Taking? Authorizing Provider  aspirin 81 MG tablet Take 81 mg by mouth daily.    Historical Provider, MD  ASTEPRO 0.15 % SOLN instill 1 spray into each nostril twice a day if needed 04/21/13   Collene Leyden, PA-C  atorvastatin (LIPITOR) 10 MG tablet take 1 tablet by mouth once daily 12/26/13   [name], MD  cephALEXin (KEFLEX) 500 MG capsule Take 1 capsule (500 mg total) by mouth 4 (four) times daily. 07/11/13   April Alfonso Patten, MD  lisinopril-hydrochlorothiazide [facility]) 10-12.5 MG per tablet take 1 tablet by mouth once daily 12/26/13   [name], MD  sildenafil (VIAGRA) 50 MG tablet Take 25 mg by mouth daily as needed for erectile dysfunction.    Historical Provider, MD  traMADol (ULTRAM) 50 MG tablet Take 1 tablet (50 mg total) by mouth every 6 (six) hours as needed for pain. 07/11/13   April Alfonso Patten, MD   History   Social History   Marital Status: Single    Spouse Name: N/A    Number of Children: N/A   Years of Education: N/A   Occupational History   Not on file.   Social History Main Topics   Smoking status: Former Smoker    Types: Cigarettes, Pipe, Cigars   Smokeless tobacco: Not on file   Alcohol Use: No   Drug Use: No   Sexual Activity: Not on file   Other Topics Concern   Not on file   Social History Narrative    No narrative on file      Review of Systems  Constitutional: Negative for fever and chills.  HENT: Negative for rhinorrhea.   Eyes: Negative for visual disturbance.  Respiratory: Negative for cough and shortness of breath.   Cardiovascular: Negative for chest pain and leg swelling.  Gastrointestinal: Negative for nausea, vomiting, abdominal pain and diarrhea.  Genitourinary: Positive for urgency and frequency. Negative for dysuria and hematuria.  Musculoskeletal: Negative for back pain.  Skin: Negative for rash.  Neurological: Negative  for dizziness, weakness, light-headedness and headaches.  Psychiatric/Behavioral: Negative for confusion.       Objective:   Physical Exam  Nursing note and vitals reviewed. Constitutional: He is oriented to person, place, and time. He appears well-developed and well-nourished. No distress.  HENT:  Head: Normocephalic and atraumatic.  Eyes: EOM are normal.  Neck: Neck supple. No tracheal deviation present.  Cardiovascular: Normal rate and regular rhythm.  Exam reveals no gallop and no friction rub.   No murmur heard. Pulmonary/Chest: Effort normal and breath sounds normal. No respiratory distress. He has no wheezes. He has no rales.  Musculoskeletal: Normal range of motion. He exhibits no edema.  No swelling of lower extremities.   Neurological: He is alert and oriented to person, place, and time. No cranial nerve deficit.  Romberg negative. No pronator drift. Heel to toe overall normal, stumbled at last step.  Skin: Skin is warm and dry.  Multiple hyperpigmented patches with few dry scaly areas over dorsal forearms.   Psychiatric: He has a normal mood and affect. His behavior is normal.    Filed Vitals:   04/06/14 1325  BP: 120/74  Pulse: 83  Temp: 98.9 F (37.2 C)  TempSrc: Oral  Resp: 16  Height: 5\' 6"  (1.676 m)  Weight: 177 lb 9.6 oz (80.559 kg)  SpO2: 94%    Results for orders placed in visit on 04/06/14  GLUCOSE, POCT (MANUAL  RESULT ENTRY)      Result Value Ref Range   POC Glucose 89  70 - 99 mg/dl  POCT GLYCOSYLATED HEMOGLOBIN (HGB A1C)      Result Value Ref Range   Hemoglobin A1C 6.2           Assessment & Plan:  Nicholas Finley is a 80 y.o. male Routine general medical examination at a health care facility - annual [facility] wellness exam. -see pt instructions below. Advised to decr risk of fall with common household obstructions and shower safety, and avoiding ladders.  -Advanced directives discussed. Has living will and son in law is POA.   -d/t cost and decr effectiveness - will defer zostavax.  -checking into TDAP coverage - recommended.  -declined prostate CA screening. Likely will not need further colonoscopy - can discuss with GI. Hearing - if desires - can refer to audiologist - using otc assistive device now.   Hyperglycemia - Plan: POCT glucose (manual entry), POCT glycosylated hemoglobin (Hb A1C) -prediabetes. No new meds. Plans on walking for exercise and watch diet.   High blood pressure - stable on current regimen.   Mental status change - Plan: Ambulatory referral to Neurology. Does not sound like true syncope.  Single episode without chest sx's.  Less likely seizure, but will refer to neuro to eval for further w/u. Rtc/er precautions.   Hearing difficulty - as above.   Other and unspecified hyperlipidemia - will continue lipitor as no intolerances at this point and CV rf of HTN. .    No orders of the defined types were placed in this encounter.   Patient Instructions  I would recommend continuing your Lipitor as long as you are not having any side effects. You may not necessarily need to get the shingles vaccine at this point as it may be less effective and due to cost. Work on removing some of the loose items that could cause tripping at home and if needed, a handle near the shower for stability. Also recommend getting items that are on high shelves requiring ladder use  on lower shelves  to decreased risk of falls. I will refer you to the Neurologist to discuss the episode you had a few months ago, but if any of these symptoms return, come back to the office or go to the Emergency Room.   Your blood sugar test was normal in the office today, but a three month average was slightly elevated. No new medications at this point but continue to watch diet and walk for exercise. Recheck with Dr. Carlota Raspberry in the next 3-6 months, sooner if anything worsens. We will likely discuss urination symptoms next visit and should have the other pneumonia vaccine for you then.    Keeping you healthy  Get these tests  Blood pressure- Have your blood pressure checked once a year by your healthcare provider.  Normal blood pressure is 120/80  Weight- Have your body mass index (BMI) calculated to screen for obesity.  BMI is a measure of body fat based on height and weight. You can also calculate your own BMI at [email or web address].  Cholesterol- Have your cholesterol checked every year.  Diabetes- Have your blood sugar checked regularly if you have high blood pressure, high cholesterol, have a family history of diabetes or if you are overweight.  Screening for Colon Cancer- Colonoscopy starting at age 8.  Screening may begin sooner depending on your family history and other health conditions. Follow up colonoscopy as directed by your Gastroenterologist. Take these medicines  Aspirin- One aspirin daily can help prevent Heart disease and Stroke.  Flu shot- Every fall.  Tetanus- Every 10 years. - I would recommend a "TDAP" but check with your insurance as you may need to pay for this out of pocket.   Pneumonia shot- Once after the age of 38; if you are younger than 46, ask your healthcare provider if you need a Pneumonia shot.  Take these steps  Don't smoke- If you do smoke, talk to your doctor about quitting.  For tips on how to quit, go to www.smokefree.gov or call 1-800-QUIT-NOW.  Be  physically active- Exercise 5 days a week for at least 30 minutes.  If you are not already physically active start slow and gradually work up to 30 minutes of moderate physical activity.  Examples of moderate activity include walking briskly, mowing the yard, dancing, swimming, bicycling, etc.  Eat a healthy diet- Eat a variety of healthy food such as fruits, vegetables, low fat milk, low fat cheese, yogurt, lean meant, poultry, fish, beans, tofu, etc. For more information go to www.thenutritionsource.org  Drink alcohol in moderation- Limit alcohol intake to less than two drinks a day. Never drink and drive.  Dentist- Brush and floss twice daily; visit your dentist twice a year.  Depression- Your emotional health is as important as your physical health. If you're feeling down, or losing interest in things you would normally enjoy please talk to your healthcare provider.  Eye exam- Visit your eye doctor every year.  Safe sex- If you may be exposed to a sexually transmitted infection, use a condom.  Seat belts- Seat belts can save your life; always wear one.  Smoke/Carbon Monoxide detectors- These detectors need to be installed on the appropriate level of your home.  Replace batteries at least once a year.  Skin cancer- When out in the sun, cover up and use sunscreen 15 SPF or higher.  Violence- If anyone is threatening you, please tell your healthcare provider.  Living Will/ Health care power of attorney- Speak with your healthcare provider and  family.    I personally performed the services described in this documentation, which was scribed in my presence. The recorded information has been reviewed and considered, and addended by me as needed.

## 2014-04-16 ENCOUNTER — Ambulatory Visit (INDEPENDENT_AMBULATORY_CARE_PROVIDER_SITE_OTHER): Payer: Medicare Other | Admitting: Neurology

## 2014-04-16 ENCOUNTER — Encounter: Payer: Self-pay | Admitting: Neurology

## 2014-04-16 VITALS — BP 151/83 | HR 97 | Temp 98.3°F | Ht 66.0 in | Wt 179.0 lb

## 2014-04-16 DIAGNOSIS — R413 Other amnesia: Secondary | ICD-10-CM

## 2014-04-16 DIAGNOSIS — G454 Transient global amnesia: Secondary | ICD-10-CM

## 2014-04-16 DIAGNOSIS — H47019 Ischemic optic neuropathy, unspecified eye: Secondary | ICD-10-CM

## 2014-04-16 NOTE — Patient Instructions (Signed)
You have complaints of memory loss: memory loss or changes in cognitive function can have many reasons and does not always mean you have dementia. Conditions that can contribute to subjective or objective memory loss include: depression, stress, poor sleep from insomnia or sleep apnea, dehydration, fluctuation in blood sugar values, thyroid or electrolyte dysfunction. Dementia can be causes by stroke, brain atherosclerosis and by Alzheimer's disease or other, more rare and sometimes hereditary causes. We will do some additional testing: blood work, a brain scan and brain wave test. We will not start medication as yet. Your memory loss is rather mild and probably in keeping with age related changes.   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 We don't quite understand the etiology or significance of TGA as yet. Some reports indicate a migraine-like etiology, some others mention a seizure-like presentation or even stroke-like origin.

## 2014-04-16 NOTE — Progress Notes (Signed)
Subjective:    Patient ID: Nicholas Finley is a 80 y.o. male.  HPI   Star Age, MD, PhD [facility] [address], [address] [place], [place] [address]  Dear Dr. Carlota Raspberry,   I saw your patient, Nicholas Finley, upon your kind request in my neurologic clinic today for initial consultation of his mental status change. The patient is unaccompanied today. As you know, Nicholas Finley is a 80 year old right-handed gentleman with a underlying medical history of cataracts, allergies, hyperlipidemia, hypertension, enlarged prostate, heart murmur, hearing loss, and hyperglycemia, who reports memory loss and approximately 6 weeks ago he had a spell, during which for about 20 seconds he does not remember what he said to a friend. He felt like he was in a trance but has no recollection of the event he was apparently staring into space. This happened in the middle of the day. He feels that he must have said something inappropriate or a bad word, judging by the aghast stares he saw on his current spaces. He was at a church friend's home. He denies any prior syncopal spell and had no chest pain, shortness of breath, slurring of speech, droopy face, headache or focal weakness at the time. He has no history of convulsions, but does state that as a teenager he has had some episodes of inattention or maybe staring. He reports that he was diagnosed with ischemic optic neuropathy years ago. I believe he saw Dr. Erling Cruz at the time.  His Past Medical History Is Significant For: Past Medical History  Diagnosis Date  . Enlarged prostate   . Hypertension   . Hypercholesteremia   . Allergy   . Cataract   . Heart murmur     His Past Surgical History Is Significant For: Past Surgical History  Procedure Laterality Date  . Skin cancer excision    . Cyst excision      pilonidal    His Family History Is Significant For: History reviewed. No pertinent family history.  His Social  History Is Significant For: History   Social History  . Marital Status: Single    Spouse Name: N/A    Number of Children: N/A  . Years of Education: N/A   Social History Main Topics  . Smoking status: Former Smoker    Types: Cigarettes, Pipe, Landscape architect  . Smokeless tobacco: None  . Alcohol Use: No  . Drug Use: No  . Sexual Activity: None   Other Topics Concern  . None   Social History Narrative  . None    His Allergies Are:  No Known Allergies:   His Current Medications Are:  Outpatient Encounter Prescriptions as of 04/16/2014  Medication Sig  . aspirin 81 MG tablet Take 81 mg by mouth daily.  . ASTEPRO 0.15 % SOLN instill 1 spray into each nostril twice a day if needed  . atorvastatin (LIPITOR) 10 MG tablet take 1 tablet by mouth once daily  . lisinopril-hydrochlorothiazide (PRINZIDE,ZESTORETIC) 10-12.5 MG per tablet take 1 tablet by mouth once daily  . sildenafil (VIAGRA) 50 MG tablet Take 25 mg by mouth daily as needed for erectile dysfunction.  . traMADol (ULTRAM) 50 MG tablet Take 1 tablet (50 mg total) by mouth every 6 (six) hours as needed for pain.  . [DISCONTINUED] cephALEXin (KEFLEX) 500 MG capsule Take 1 capsule (500 mg total) by mouth 4 (four) times daily.  :   Review of Systems:  Out of a complete 14 point review of systems, all  are reviewed and negative with the exception of these symptoms as listed below:   Review of Systems  Constitutional: Negative.   HENT: Positive for hearing loss and rhinorrhea.   Eyes: Negative.   Respiratory: Positive for cough.   Cardiovascular: Negative.   Gastrointestinal: Negative.   Endocrine: Negative.   Genitourinary: Negative.   Musculoskeletal: Negative.   Skin: Negative.   Allergic/Immunologic: Negative.   Neurological:       Memory loss  Hematological: Negative.   Psychiatric/Behavioral: Negative.     Objective:  Neurologic Exam  Physical Exam Physical Examination:   Filed Vitals:   04/16/14 0818  BP:  151/83  Pulse: 97  Temp: 98.3 F (36.8 C)    General Examination: The patient is a very pleasant 80 y.o. male in no acute distress. He is calm and cooperative with the exam. He denies Auditory Hallucinations and Visual Hallucinations. He is well groomed and situated in a chair.   HEENT: Normocephalic, atraumatic, pupils are equal, round and reactive to light and accommodation. Funduscopic exam is normal with sharp disc margins noted. Extraocular tracking shows mild saccadic breakdown without nystagmus noted. Hearing is impaired. Tympanic membranes are obscured by cerumen bilaterally. Face is symmetric with no significant facial masking and normal facial sensation. There is no lip, neck or jaw tremor. Neck is Not significantly rigid with intact passive ROM. There are no carotid bruits on auscultation. Oropharynx exam reveals mild mouth dryness. There is moderate airway crowding noted primarily because of thicker tongue and narrow airway entry as well as redundant soft palate. Tonsils are absent. Mallampati is class III. Tongue protrudes centrally and palate elevates symmetrically.    Chest: is clear to auscultation without wheezing, rhonchi or crackles noted.  Heart: sounds are regular and normal without murmurs, rubs or gallops noted.   Abdomen: is soft, non-tender and non-distended with normal bowel sounds appreciated on auscultation.  Extremities: There is no pitting edema in the distal lower extremities bilaterally. Pedal pulses are intact.   Skin: is warm and dry with no trophic changes noted. Age-related changes are noted on the skin.   Musculoskeletal: exam reveals no obvious joint deformities, tenderness or joint swelling or erythema.   Neurologically:  Mental status: The patient is awake and alert, paying good  attention. He is able to completely provide the history. He is oriented to: person, place, time/date, situation, day of week, month of year and year. His memory, attention,  language and knowledge are intact. There is no aphasia, agnosia, apraxia or anomia. There is a mild degree of bradyphrenia. Speech is mildly hypophonic with no dysarthria noted. Mood is congruent and affect is normal.  His MMSE (Mini-Mental state exam) score is 30/30.  CDT (Clock Drawing Test) score is 4/4.  AFT (Animal Fluency Test) score is 10.   Cranial nerves are as described above under HEENT exam. In addition, shoulder shrug is normal with equal shoulder height noted.  Motor exam: Normal bulk, and strength for age is noted. Tone is not rigid with absence of cogwheeling in the extremities. There is overall no significant bradykinesia. There is no drift or rebound. There is no tremor.   Romberg testing displays mild swaying. Reflexes are 1+ in the upper extremities and absent in the ankles, trace in the knees. Toes are downgoing bilaterally. Fine motor skills: Finger taps, hand movements, and rapid alternating patting are mildly impaired bilaterally. Foot taps and foot agility are mildly impaired bilaterally.   Cerebellar testing shows no dysmetria or intention tremor  on finger to nose testing. Heel to shin is unremarkable. There is no truncal or gait ataxia.   Sensory exam is intact to light touch, pinprick, vibration, temperature sense and proprioception in the upper and lower extremities.   Gait, station and balance: He stands up from the seated position with no significant difficulty. No veering to one side is noted. No leaning to one side. Posture is mildly stooped, but probably age-appropriate. Stance is wide-based mildly. He turns in 3 steps. Tandem walk is slightly difficult for him. Balance is mildly impaired.     Assessment and Plan:   In summary, Nicholas Finley is a very pleasant 80 y.o.-year old male with a underlying medical history of cataracts, allergies, hyperlipidemia, hypertension, enlarged prostate, heart murmur, hearing loss, and hyperglycemia, who reports memory loss  and a recent brief spell of loss of time. He may have had a staring spell or pre-syncopal spell. Differential diagnosis also includes transient global amnesia, but his presentation is not consistent with a TIA. I would like to proceed with further testing in the form of a brain MRI, blood work and EEG. I advised the patient to stay well hydrated, well rested, and overall try to maintain a healthy lifestyle in general, to stay active mentally and physically. I encouraged the patient to eat healthy, exercise daily and keep well hydrated, to keep a scheduled bedtime and wake time routine, to not skip any meals and eat healthy snacks in between meals and to have protein with every meal. I suggested no new medications today. His MMSE was 30 out of 30 but he does have a mild decrease in his category fluency. His clock drawing was unremarkable. I would like to see him back in about 3 months, sooner if the need arises and we will keep him posted about his test results over the phone. I answered all his questions today and suggested he call with any interim questions, concerns, problems or updates.  Thank you very much for allowing me to participate in the care of this nice patient. If I can be of any further assistance to you please do not hesitate to call me at [phone].  Sincerely,   Star Age, MD, PhD

## 2014-04-18 LAB — SEDIMENTATION RATE: SED RATE: 10 mm/h (ref 0–30)

## 2014-04-18 LAB — CBC WITH DIFFERENTIAL
BASOS ABS: 0 10*3/uL (ref 0.0–0.2)
Basos: 0 %
EOS: 0 %
Eosinophils Absolute: 0.1 10*3/uL (ref 0.0–0.4)
HEMATOCRIT: 44.1 % (ref 37.5–51.0)
Hemoglobin: 14.6 g/dL (ref 12.6–17.7)
IMMATURE GRANULOCYTES: 0 %
Immature Grans (Abs): 0 10*3/uL (ref 0.0–0.1)
LYMPHS ABS: 5.5 10*3/uL — AB (ref 0.7–3.1)
LYMPHS: 34 %
MCH: 31.6 pg (ref 26.6–33.0)
MCHC: 33.1 g/dL (ref 31.5–35.7)
MCV: 96 fL (ref 79–97)
MONOCYTES: 9 %
Monocytes Absolute: 1.5 10*3/uL — ABNORMAL HIGH (ref 0.1–0.9)
Neutrophils Absolute: 9.2 10*3/uL — ABNORMAL HIGH (ref 1.4–7.0)
Neutrophils Relative %: 57 %
PLATELETS: 396 10*3/uL — AB (ref 150–379)
RBC: 4.62 x10E6/uL (ref 4.14–5.80)
RDW: 14 % (ref 12.3–15.4)
WBC: 16.2 10*3/uL — AB (ref 3.4–10.8)

## 2014-04-18 LAB — TSH: TSH: 1.62 u[IU]/mL (ref 0.450–4.500)

## 2014-04-18 LAB — COMPREHENSIVE METABOLIC PANEL
A/G RATIO: 1.7 (ref 1.1–2.5)
ALT: 17 IU/L (ref 0–44)
AST: 17 IU/L (ref 0–40)
Albumin: 4.5 g/dL (ref 3.5–4.7)
Alkaline Phosphatase: 54 IU/L (ref 39–117)
BUN/Creatinine Ratio: 22 (ref 10–22)
BUN: 19 mg/dL (ref 8–27)
CALCIUM: 9.7 mg/dL (ref 8.6–10.2)
CO2: 26 mmol/L (ref 18–29)
CREATININE: 0.88 mg/dL (ref 0.76–1.27)
Chloride: 102 mmol/L (ref 97–108)
GFR calc Af Amer: 90 mL/min/{1.73_m2} (ref >59)
GFR, EST NON AFRICAN AMERICAN: 78 mL/min/{1.73_m2} (ref >59)
GLOBULIN, TOTAL: 2.7 g/dL (ref 1.5–4.5)
GLUCOSE: 84 mg/dL (ref 65–99)
Potassium: 4.7 mmol/L (ref 3.5–5.2)
Sodium: 143 mmol/L (ref 134–144)
TOTAL PROTEIN: 7.2 g/dL (ref 6.0–8.5)
Total Bilirubin: 0.8 mg/dL (ref 0.0–1.2)

## 2014-04-18 LAB — VITAMIN B1, WHOLE BLOOD: THIAMINE: 179.5 nmol/L (ref 66.5–200.0)

## 2014-04-18 LAB — RPR: RPR: NONREACTIVE

## 2014-04-29 NOTE — Progress Notes (Signed)
Quick Note:  Please advise patient that his blood work was fine with the exception of an elevated white blood cell count and elevated platelet count which can be seen in infection. If he is having any infection symptoms such as cough, cold, urinary symptoms he is advised to get this checked with his primary care physician. In addition, his primary care physician may have prior test results he can compare it with. I would recommend that he discuss his blood count results with his primary care physician. Otherwise, electrolytes, liver function, thyroid function, vitamin B1, inflammatory markers are unremarkable. Star Age, MD, PhD [facility] ([facility]  ______

## 2014-05-01 DIAGNOSIS — H25019 Cortical age-related cataract, unspecified eye: Secondary | ICD-10-CM | POA: Diagnosis not present

## 2014-05-01 DIAGNOSIS — H2510 Age-related nuclear cataract, unspecified eye: Secondary | ICD-10-CM | POA: Diagnosis not present

## 2014-05-01 DIAGNOSIS — H40059 Ocular hypertension, unspecified eye: Secondary | ICD-10-CM | POA: Diagnosis not present

## 2014-05-01 DIAGNOSIS — H43819 Vitreous degeneration, unspecified eye: Secondary | ICD-10-CM | POA: Diagnosis not present

## 2014-05-04 ENCOUNTER — Ambulatory Visit (INDEPENDENT_AMBULATORY_CARE_PROVIDER_SITE_OTHER): Payer: Medicare Other

## 2014-05-04 DIAGNOSIS — R413 Other amnesia: Secondary | ICD-10-CM | POA: Diagnosis not present

## 2014-05-04 DIAGNOSIS — H47019 Ischemic optic neuropathy, unspecified eye: Secondary | ICD-10-CM

## 2014-05-04 DIAGNOSIS — G454 Transient global amnesia: Secondary | ICD-10-CM | POA: Diagnosis not present

## 2014-05-04 NOTE — Progress Notes (Signed)
Quick Note:  I called pt and relayed the EEG results. (normal). He will Contact [facility] about the MRI (his cost). He is scheduled this Monday. ______

## 2014-05-04 NOTE — Progress Notes (Signed)

## 2014-05-04 NOTE — Procedures (Signed)
    History:  Nicholas Finley is an 80 year old gentleman with a history of episodes of sudden mental status changes associated with memory loss for several seconds, staring off into space. He is being evaluated for possible seizures.  This is a routine EEG. No skull defects are noted, medications include aspirin, Lipitor, Prinizide, Viagra, and Ultram.   EEG classification: Normal awake  Description of the recording: The background rhythms of this recording consists of a fairly well modulated medium amplitude alpha rhythm of 8 Hz that is reactive to eye opening and closure. As the record progresses, the patient appears to remain in the waking state throughout the recording. Photic stimulation was performed, resulting in a bilateral and symmetric photic driving response. Hyperventilation was also performed, resulting in a minimal buildup of the background rhythm activities without significant slowing seen. At no time during the recording does there appear to be evidence of spike or spike wave discharges or evidence of focal slowing. EKG monitor shows no evidence of cardiac rhythm abnormalities with a heart rate of 78.  Impression: This is a normal EEG recording in the waking state. No evidence of ictal or interictal discharges are seen.

## 2014-05-11 ENCOUNTER — Ambulatory Visit
Admission: RE | Admit: 2014-05-11 | Discharge: 2014-05-11 | Disposition: A | Discharge status: Home / Self Care | Payer: Medicare Other | Source: Ambulatory Visit | Attending: Neurology | Admitting: Neurology

## 2014-05-11 DIAGNOSIS — H47019 Ischemic optic neuropathy, unspecified eye: Secondary | ICD-10-CM

## 2014-05-11 DIAGNOSIS — R413 Other amnesia: Secondary | ICD-10-CM

## 2014-05-11 DIAGNOSIS — G454 Transient global amnesia: Secondary | ICD-10-CM

## 2014-05-13 ENCOUNTER — Telehealth: Payer: Self-pay | Admitting: Family Medicine

## 2014-05-13 ENCOUNTER — Ambulatory Visit: Payer: Medicare Other

## 2014-05-13 ENCOUNTER — Ambulatory Visit (INDEPENDENT_AMBULATORY_CARE_PROVIDER_SITE_OTHER): Payer: Medicare Other | Admitting: Family Medicine

## 2014-05-13 VITALS — BP 132/72 | HR 82 | Temp 98.9°F | Resp 16 | Ht 65.0 in | Wt 177.6 lb

## 2014-05-13 DIAGNOSIS — D72829 Elevated white blood cell count, unspecified: Secondary | ICD-10-CM | POA: Diagnosis not present

## 2014-05-13 DIAGNOSIS — Z87440 Personal history of urinary (tract) infections: Secondary | ICD-10-CM | POA: Diagnosis not present

## 2014-05-13 DIAGNOSIS — R350 Frequency of micturition: Secondary | ICD-10-CM | POA: Diagnosis not present

## 2014-05-13 DIAGNOSIS — R05 Cough: Secondary | ICD-10-CM

## 2014-05-13 DIAGNOSIS — R059 Cough, unspecified: Secondary | ICD-10-CM

## 2014-05-13 DIAGNOSIS — J309 Allergic rhinitis, unspecified: Secondary | ICD-10-CM | POA: Diagnosis not present

## 2014-05-13 DIAGNOSIS — R351 Nocturia: Secondary | ICD-10-CM

## 2014-05-13 LAB — POCT UA - MICROSCOPIC ONLY
CASTS, UR, LPF, POC: NEGATIVE
Crystals, Ur, HPF, POC: NEGATIVE
MUCUS UA: NEGATIVE
YEAST UA: NEGATIVE

## 2014-05-13 LAB — POCT CBC
Granulocyte percent: 48.5 %G (ref 37–80)
HEMATOCRIT: 42.9 % — AB (ref 43.5–53.7)
HEMOGLOBIN: 13.6 g/dL — AB (ref 14.1–18.1)
LYMPH, POC: 5.9 — AB (ref 0.6–3.4)
MCH, POC: 30.6 pg (ref 27–31.2)
MCHC: 31.7 g/dL — AB (ref 31.8–35.4)
MCV: 96.7 fL (ref 80–97)
MID (cbc): 1 — AB (ref 0–0.9)
MPV: 9 fL (ref 0–99.8)
POC GRANULOCYTE: 6.5 (ref 2–6.9)
POC LYMPH %: 44.3 % (ref 10–50)
POC MID %: 7.2 %M (ref 0–12)
Platelet Count, POC: 319 10*3/uL (ref 142–424)
RBC: 4.44 M/uL — AB (ref 4.69–6.13)
RDW, POC: 14.7 %
WBC: 13.3 10*3/uL — AB (ref 4.6–10.2)

## 2014-05-13 LAB — POCT URINALYSIS DIPSTICK
BILIRUBIN UA: NEGATIVE
GLUCOSE UA: NEGATIVE
Ketones, UA: NEGATIVE
LEUKOCYTES UA: NEGATIVE
NITRITE UA: NEGATIVE
Protein, UA: NEGATIVE
Spec Grav, UA: 1.02
UROBILINOGEN UA: 1
pH, UA: 6.5

## 2014-05-13 NOTE — Telephone Encounter (Signed)
Spoke to pt he will RTC.

## 2014-05-13 NOTE — Telephone Encounter (Signed)
Please call pt. He had an elevated blood count from evaluation with neurology.  Recommend follow up for repeat CBC and if still elevated, to determine why.  Walk in or by appointment in next week or two if possible. Thanks.

## 2014-05-13 NOTE — Progress Notes (Addendum)
Subjective:    Patient ID: Nicholas Finley, male    DOB: [birth year], 80 y.o.   MRN: [id number] This chart was scribed for Merri Ray, MD by Anastasia Pall, ED Scribe. This patient was seen in room 01 and the patient's care was started at 6:41 PM.  Chief Complaint  Patient presents with  . Follow-up    To discuss Abnormal labs   HPI Nicholas Finley is a 80 y.o. male Pt last seen by me 04/15. At that time pt had had 1 episode of difficulty with memory 1 month prior where he "went out" and does not remember what he said to a friend. No recurrence of symptoms but with h/o HTN, hyperglycemia, hyperlipidemia pt was referred to neurologist. Diagnosed with transient global amnesia. Normal MMSE. MRI of his brain on 05/18 was overall normal except for moderate atrophy and some deep white matter changes. No stoke, mass, or acute findings. EEG that was normal 05/11. He had blood work done including a CMP, RPR, TSH, Sed rate, andThymine that were non reactive, but did have a CBC with an elevated white blood count of 16.2. and platelets of 396 both slightly elevated form 19 months ago, with WBC of 14.5 and platelets 334 when he was seen in ER for possible cystitis. However no urine culture was obtained at that time. Here to f/u on elevated blood count.   Pt denies trouble with back pain, dysuria since being put on antibiotic. He reports mild urinary frequency at baseline. He reports having prostate checked at urologist 1 week after his ER visit with normal results. He denies fevers in the past month. He reports intermittent cough and sneezing with allergies, but denies worsening cough. He reports using nasal spray PRN for his allergies, states it does help when he remembers to take it. He states his allergy symptoms will remain constant throughout the year, doesn't follow traditional changing of seasons. He denies worsened chest pain and denies abdominal pain.   PCP - [name], MD  Patient Active  Problem List   Diagnosis Date Noted  . HTN (hypertension) 03/01/2012  . Hyperlipidemia 03/01/2012  . Hearing difficulty 03/01/2012  . Allergic rhinitis 03/01/2012   Past Medical History  Diagnosis Date  . Enlarged prostate   . Hypertension   . Hypercholesteremia   . Allergy   . Cataract   . Heart murmur    Past Surgical History  Procedure Laterality Date  . Skin cancer excision    . Cyst excision      pilonidal   No Known Allergies Prior to Admission medications   Medication Sig Start Date End Date Taking? Authorizing Provider  aspirin 81 MG tablet Take 81 mg by mouth daily.   Yes Historical Provider, MD  ASTEPRO 0.15 % SOLN instill 1 spray into each nostril twice a day if needed 04/21/13  Yes Heather M Marte, PA-C  atorvastatin (LIPITOR) 10 MG tablet take 1 tablet by mouth once daily 12/26/13  Yes [name], MD  lisinopril-hydrochlorothiazide (PRINZIDE,ZESTORETIC) 10-12.5 MG per tablet take 1 tablet by mouth once daily 12/26/13  Yes [name], MD  sildenafil (VIAGRA) 50 MG tablet Take 25 mg by mouth daily as needed for erectile dysfunction.   Yes Historical Provider, MD  traMADol (ULTRAM) 50 MG tablet Take 1 tablet (50 mg total) by mouth every 6 (six) hours as needed for pain. 07/11/13  Yes April Alfonso Patten, MD    Review of Systems  Constitutional: Negative for fever.  HENT: Positive for postnasal drip and sneezing (secondary to allergies).   Respiratory: Positive for cough (intermittent, unchanged with chronic h/o allergies).   Cardiovascular: Negative for chest pain.  Gastrointestinal: Negative for abdominal pain.  Genitourinary: Negative for dysuria, frequency and difficulty urinating.  Musculoskeletal: Negative for back pain.  Allergic/Immunologic: Positive for environmental allergies.      Objective:   Physical Exam  Vitals reviewed. Constitutional: He is oriented to person, place, and time. He appears well-developed and well-nourished.  HENT:    Head: Normocephalic and atraumatic.  Right Ear: Tympanic membrane, external ear and ear canal normal.  Left Ear: Tympanic membrane, external ear and ear canal normal.  Nose: No rhinorrhea.  Mouth/Throat: Oropharynx is clear and moist and mucous membranes are normal. No oropharyngeal exudate or posterior oropharyngeal erythema.  Eyes: Conjunctivae are normal. Pupils are equal, round, and reactive to light.  Neck: Neck supple.  Cardiovascular: Normal rate, regular rhythm, normal heart sounds and intact distal pulses.   No murmur heard. Pulmonary/Chest: Effort normal and breath sounds normal. He has no wheezes. He has no rhonchi. He has no rales.  Abdominal: Soft. There is no tenderness.  Lymphadenopathy:    He has no cervical adenopathy.  Neurological: He is alert and oriented to person, place, and time.  Skin: Skin is warm and dry. No rash noted.  Psychiatric: He has a normal mood and affect. His behavior is normal.   BP 132/72  Pulse 82  Temp(Src) 98.9 F (37.2 C) (Oral)  Resp 16  Ht 5\' 5"  (1.651 m)  Wt 177 lb 9.6 oz (80.559 kg)  BMI 29.55 kg/m2  SpO2 94%  [facility] reading (PRIMARY) by Dr. Carlota Raspberry: CXR:   Results for orders placed in visit on 05/13/14  POCT UA - MICROSCOPIC ONLY      Result Value Ref Range   WBC, Ur, HPF, POC 0-1     RBC, urine, microscopic 0-2     Bacteria, U Microscopic trace     Mucus, UA neg     Epithelial cells, urine per micros 0-2     Crystals, Ur, HPF, POC neg     Casts, Ur, LPF, POC neg     Yeast, UA neg    POCT URINALYSIS DIPSTICK      Result Value Ref Range   Color, UA yellow     Clarity, UA clear     Glucose, UA neg     Bilirubin, UA neg     Ketones, UA neg     Spec Grav, UA 1.020     Blood, UA trace     pH, UA 6.5     Protein, UA neg     Urobilinogen, UA 1.0     Nitrite, UA neg     Leukocytes, UA Negative    POCT CBC      Result Value Ref Range   WBC 13.3 (*) 4.6 - 10.2 K/uL   Lymph, poc 5.9 (*) 0.6 - 3.4   POC LYMPH PERCENT 44.3   10 - 50 %L   MID (cbc) 1.0 (*) 0 - 0.9   POC MID % 7.2  0 - 12 %M   POC Granulocyte 6.5  2 - 6.9   Granulocyte percent 48.5  37 - 80 %G   RBC 4.44 (*) 4.69 - 6.13 M/uL   Hemoglobin 13.6 (*) 14.1 - 18.1 g/dL   HCT, POC 42.9 (*) 43.5 - 53.7 %   MCV 96.7  80 - 97 fL   MCH,  POC 30.6  27 - 31.2 pg   MCHC 31.7 (*) 31.8 - 35.4 g/dL   RDW, POC 14.7     Platelet Count, POC 319  142 - 424 K/uL   MPV 9.0  0 - 99.8 fL   [facility] reading (PRIMARY) by  Dr. Carlota Raspberry: CXR: ? Scarring LLL >RLL, no apparent infiltrate.      Assessment & Plan:  Nicholas Finley is a 80 y.o. male Allergic rhinitis, Cough - Plan: POCT CBC, DG Chest 2 View  - suspected PND causing cough. Continue astepro for sx's.   Elevated WBC count - Plan: POCT CBC History of UTI - Plan: POCT UA - Microscopic Only, POCT urinalysis dipstick, Urine culture, PSA Nocturia - Plan: Urine culture, PSA,  Urinary frequency - Plan: Urine culture, PSA  - afebrile, asx except urinary frequency, but may be normal for him and underling BPH (seen by urology late last year).  ddx of chronic prostatitis - check urine cx, psa and may need to compare to prior reading at urology. Repeat CBC in 1 month, and if ramains elevated - may need hematology eval. rtc precautions.   No orders of the defined types were placed in this encounter.   Patient Instructions  We will check for chronic prostate infection with a PSA test, but can compare to readings at urologist office. You should receive a call or letter about your lab results within the next week to 10 days.  Your blood count is better, but still slightly elevated. Plan to recheck blood count in next 1 month.  Return to see me then, or sooner if any fever, or new/worsening symptoms.  Nasal spray for allergy symptoms and cough.   Return to the clinic or go to the nearest emergency room if any of your symptoms worsen or new symptoms occur.        I personally performed the services described in this  documentation, which was scribed in my presence. The recorded information has been reviewed and considered, and addended by me as needed.

## 2014-05-13 NOTE — Patient Instructions (Signed)
We will check for chronic prostate infection with a PSA test, but can compare to readings at urologist office. You should receive a call or letter about your lab results within the next week to 10 days.  Your blood count is better, but still slightly elevated. Plan to recheck blood count in next 1 month.  Return to see me then, or sooner if any fever, or new/worsening symptoms.  Nasal spray for allergy symptoms and cough.   Return to the clinic or go to the nearest emergency room if any of your symptoms worsen or new symptoms occur.

## 2014-05-13 NOTE — Progress Notes (Signed)
Quick Note:  Please call patient regarding the recent brain MRI: The brain scan showed a normal structure of the brain, but moderate volume loss which we call atrophy. There were changes in the deeper structures of the brain, which we call white matter changes or microvascular changes. These were reported as moderate in His case. These are white spots, that typically occur with time and are seen in a variety of conditions, including with normal aging, chronic hypertension, chronic headaches, especially migraine HAs, chronic diabetes, chronic hyperlipidemia. These are not strokes and no mass or lesion or contrast enhancement was seen which is reassuring. Again, there were no acute findings, such as a stroke, or mass or blood products. No further action is required on this test at this time, other than re-enforcing the importance of good blood pressure control, good cholesterol control, good blood sugar control, and weight management. Please remind patient to keep any upcoming appointments or tests and to call us with any interim questions, concerns, problems or updates. Thanks,  Nicholas Age, MD, PhD    ______

## 2014-05-14 ENCOUNTER — Telehealth: Payer: Self-pay | Admitting: *Deleted

## 2014-05-14 NOTE — Telephone Encounter (Signed)
Patients need mri results.

## 2014-05-14 NOTE — Progress Notes (Signed)
Quick Note:  I called pt and relayed the results of MRI brain. He verbalized undertstanding. ______

## 2014-05-14 NOTE — Telephone Encounter (Signed)
I called and gave the results of MRI to pt.  He stated that he saw pcp yesterday and had blood work done.  WBC down but still elevated.   Let [name] know.

## 2014-05-15 LAB — URINE CULTURE
COLONY COUNT: NO GROWTH
Organism ID, Bacteria: NO GROWTH

## 2014-05-15 LAB — PSA: PSA: 0.93 ng/mL (ref <=4.00)

## 2014-07-10 ENCOUNTER — Other Ambulatory Visit: Payer: Self-pay | Admitting: Family Medicine

## 2014-08-07 ENCOUNTER — Other Ambulatory Visit: Payer: Self-pay | Admitting: Family Medicine

## 2014-08-27 ENCOUNTER — Ambulatory Visit (INDEPENDENT_AMBULATORY_CARE_PROVIDER_SITE_OTHER): Payer: Medicare Other | Admitting: Neurology

## 2014-08-27 ENCOUNTER — Encounter: Payer: Self-pay | Admitting: Neurology

## 2014-08-27 VITALS — BP 143/78 | HR 74 | Temp 97.9°F | Ht 67.0 in | Wt 178.0 lb

## 2014-08-27 DIAGNOSIS — G319 Degenerative disease of nervous system, unspecified: Secondary | ICD-10-CM | POA: Diagnosis not present

## 2014-08-27 DIAGNOSIS — R930 Abnormal findings on diagnostic imaging of skull and head, not elsewhere classified: Secondary | ICD-10-CM | POA: Diagnosis not present

## 2014-08-27 DIAGNOSIS — R413 Other amnesia: Secondary | ICD-10-CM | POA: Diagnosis not present

## 2014-08-27 DIAGNOSIS — R4789 Other speech disturbances: Secondary | ICD-10-CM | POA: Diagnosis not present

## 2014-08-27 DIAGNOSIS — R9082 White matter disease, unspecified: Secondary | ICD-10-CM

## 2014-08-27 DIAGNOSIS — G3101 Pick's disease: Secondary | ICD-10-CM

## 2014-08-27 DIAGNOSIS — F0280 Dementia in other diseases classified elsewhere without behavioral disturbance: Secondary | ICD-10-CM
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
## 2014-08-27 NOTE — Patient Instructions (Addendum)
I am not sure what your spell was in March. We have done blood work, MRI brain and an EEG.   I do want to suggest a few things today:   Remember to drink plenty of fluid (about 6 Glasses of water, 8 oz size), eat healthy meals and do not skip any meals. Try to eat protein with a every meal and eat a healthy snack such as fruit or nuts in between meals. Try to keep a regular sleep-wake schedule and try to exercise daily, particularly in the form of walking, 20-30 minutes a day, if you can.   Taking your medication on schedule is key. Continue your meds.   Try to stay active physically and mentally. Engage in social activities in your community and with your family and try to keep up with current events by reading the newspaper or watching the news. Try to do word puzzles and you may like to do word puzzles and brain games on the computer such as on [email or web address]   I can see you back as needed.

## 2014-10-05 ENCOUNTER — Ambulatory Visit: Payer: Medicare Other | Admitting: Family Medicine

## 2014-10-16 ENCOUNTER — Other Ambulatory Visit: Payer: Self-pay | Admitting: Family Medicine

## 2014-11-19 ENCOUNTER — Other Ambulatory Visit: Payer: Self-pay | Admitting: Family Medicine

## 2014-11-30 ENCOUNTER — Ambulatory Visit (INDEPENDENT_AMBULATORY_CARE_PROVIDER_SITE_OTHER): Payer: Medicare Other | Admitting: Family Medicine

## 2014-11-30 ENCOUNTER — Encounter: Payer: Self-pay | Admitting: Family Medicine

## 2014-11-30 VITALS — BP 110/72 | HR 83 | Temp 98.9°F | Resp 16 | Ht 66.0 in | Wt 171.6 lb

## 2014-11-30 DIAGNOSIS — I10 Essential (primary) hypertension: Secondary | ICD-10-CM | POA: Diagnosis not present

## 2014-11-30 DIAGNOSIS — R351 Nocturia: Secondary | ICD-10-CM | POA: Diagnosis not present

## 2014-11-30 DIAGNOSIS — D72829 Elevated white blood cell count, unspecified: Secondary | ICD-10-CM | POA: Diagnosis not present

## 2014-11-30 DIAGNOSIS — Z23 Encounter for immunization: Secondary | ICD-10-CM | POA: Diagnosis not present

## 2014-11-30 DIAGNOSIS — E785 Hyperlipidemia, unspecified: Secondary | ICD-10-CM | POA: Diagnosis not present

## 2014-11-30 DIAGNOSIS — R7303 Prediabetes: Secondary | ICD-10-CM

## 2014-11-30 DIAGNOSIS — Z125 Encounter for screening for malignant neoplasm of prostate: Secondary | ICD-10-CM

## 2014-11-30 DIAGNOSIS — R7309 Other abnormal glucose: Secondary | ICD-10-CM

## 2014-11-30 LAB — GLUCOSE, POCT (MANUAL RESULT ENTRY): POC Glucose: 92 mg/dl (ref 70–99)

## 2014-11-30 LAB — CBC
HCT: 43 % (ref 39.0–52.0)
Hemoglobin: 15.2 g/dL (ref 13.0–17.0)
MCH: 31.3 pg (ref 26.0–34.0)
MCHC: 35.3 g/dL (ref 30.0–36.0)
MCV: 88.7 fL (ref 78.0–100.0)
MPV: 9.7 fL (ref 9.4–12.4)
PLATELETS: 291 10*3/uL (ref 150–400)
RBC: 4.85 MIL/uL (ref 4.22–5.81)
RDW: 13.8 % (ref 11.5–15.5)
WBC: 12.3 10*3/uL — ABNORMAL HIGH (ref 4.0–10.5)

## 2014-11-30 LAB — COMPLETE METABOLIC PANEL WITH GFR
ALK PHOS: 47 U/L (ref 39–117)
ALT: 20 U/L (ref 0–53)
AST: 20 U/L (ref 0–37)
Albumin: 4.2 g/dL (ref 3.5–5.2)
BILIRUBIN TOTAL: 1.6 mg/dL — AB (ref 0.2–1.2)
BUN: 15 mg/dL (ref 6–23)
CO2: 27 mEq/L (ref 19–32)
Calcium: 9.6 mg/dL (ref 8.4–10.5)
Chloride: 101 mEq/L (ref 96–112)
Creat: 0.8 mg/dL (ref 0.50–1.35)
GFR, EST NON AFRICAN AMERICAN: 81 mL/min
GFR, Est African American: >89 mL/min
GLUCOSE: 88 mg/dL (ref 70–99)
POTASSIUM: 4 meq/L (ref 3.5–5.3)
Sodium: 136 mEq/L (ref 135–145)
Total Protein: 7 g/dL (ref 6.0–8.3)

## 2014-11-30 LAB — LIPID PANEL
CHOL/HDL RATIO: 3.8 ratio
CHOLESTEROL: 167 mg/dL (ref 0–200)
HDL: 44 mg/dL (ref >39)
LDL Cholesterol: 96 mg/dL (ref 0–99)
TRIGLYCERIDES: 133 mg/dL (ref <150)
VLDL: 27 mg/dL (ref 0–40)

## 2014-11-30 LAB — POCT GLYCOSYLATED HEMOGLOBIN (HGB A1C): Hemoglobin A1C: 5.9

## 2014-11-30 MED ORDER — LISINOPRIL 10 MG PO TABS: 10.0000 mg | ORAL_TABLET | Freq: Every day | ORAL | Status: DC

## 2014-11-30 MED ORDER — ATORVASTATIN CALCIUM 10 MG PO TABS: 10.0000 mg | ORAL_TABLET | Freq: Every day | ORAL | Status: DC

## 2014-11-30 NOTE — Progress Notes (Addendum)
Subjective:    Patient ID: Nicholas Finley, male    DOB: 09-03-1926, 80 y.o.   MRN: [id number]  This chart was scribed for Nicholas Agreste, MD by [name], ED Scribe. This patient was seen at [facility] and the patient's care was started at 2:44 PM.   Hyperlipidemia Pertinent negatives include no chest pain, myalgias or shortness of breath.  Hypertension Pertinent negatives include no chest pain, headaches, palpitations or shortness of breath.    HTN Kidney function testing normal in April with creatinine 0.88. He takes Zestoretic 10-12.5 once per day. He notes occasional aching after mowing the lawn, but denies other muscle aches. Pt takes Lipitor and has increased fluid intake to 8 8-oz glasses of water daily. He also notes taking Aspirin 180 mg each day. Pt denies light-headedness, CP, SOB, chest tightness, leg swelling, abdominal pain and blood in stool.    Hyperlipidemia He takes Lipitor 10 mg once per day. Most recet lipid panel of January this year. Stable except borderline triglycerides.   Lab Results  Component Value Date   CHOL 163 12/26/2013   HDL 44 12/26/2013   LDLCALC 88 12/26/2013   TRIG 153* 12/26/2013   CHOLHDL 3.7 12/26/2013   Hyperglycemia noted at physical in April 2015. Elevated A1C of 6.2 in April of this year. Plan at that visit was walking for excercise and diet changes with recheck in 3-6 months. Weight of 177 when seen here on May 20. He is down 6 lbs to 171 today.   Possible Transient Global Amnesia Episode He also has history of a white matter abnormality on the MRI of his brain with memory loss and a single spell of change in speech thought to be a transient global amnesia episode that was initially evaluated April 23 of this year.  His most recent visit with [name] was on September 3. He has not had any further spells. There were no abnormal findings on MMSE or EEG. After last visit, plan on follow up as needed basis only  as stable and non-focal exam showed no recurrence of prior symptoms.   He had normal blood work except increase white blood count of 16.2 and platelets 396. This was rechecked with me on May 20th that was lower with white blood cells of 13.3 and normal platelets at 319. He denies recurrence of spells.   Immunizations Flu shot given today. Plan on Prevnar today. PCV 23 was given in August 2008.  Elevated WBC count, see above. He denies fever, difficulty breathing and changes in urination. He states frequency of urination every 2 hours at night, but that symptom is chronic and unchanged today.    Nocturia History of BPH. Pt had PSA level of 0.93 in May. He states urination every 2 hours nightly. He notes that he takes BP medication in the morning. Pt saw [name] last winter. Pt drinks occasional, 4 oz of beer.   Patient Active Problem List   Diagnosis Date Noted  . HTN (hypertension) 03/01/2012  . Hyperlipidemia 03/01/2012  . Hearing difficulty 03/01/2012  . Allergic rhinitis 03/01/2012   Past Medical History  Diagnosis Date  . Enlarged prostate   . Hypertension   . Hypercholesteremia   . Allergy   . Cataract   . Heart murmur    Past Surgical History  Procedure Laterality Date  . Skin cancer excision    . Cyst excision      pilonidal   No Known Allergies Prior  to Admission medications   Medication Sig Start Date End Date Taking? Authorizing Provider  ASTEPRO 0.15 % SOLN instill 1 spray into each nostril twice a day if needed 04/21/13  Yes Heather M Marte, PA-C  atorvastatin (LIPITOR) 10 MG tablet Take 1 tablet (10 mg total) by mouth daily. PATIENT NEEDS OFFICE VISIT FOR ADDITIONAL REFILLS 11/20/14  Yes Nicholas Agreste, MD  lisinopril-hydrochlorothiazide (PRINZIDE,ZESTORETIC) 10-12.5 MG per tablet TAKE 1 TABLET BY MOUTH ONCE DAILY 10/16/14  Yes Mancel Bale, PA-C  aspirin 81 MG tablet Take 81 mg by mouth daily.    Historical Provider, MD   History   Social History    . Marital Status: Single    Spouse Name: N/A    Number of Children: 0  . Years of Education: 16   Occupational History  .      retired   Social History Main Topics  . Smoking status: Former Smoker    Types: Cigarettes, Pipe, Landscape architect  . Smokeless tobacco: Never Used     Comment: cigars   . Alcohol Use: Yes     Comment: occas beer  . Drug Use: No  . Sexual Activity: Not on file   Other Topics Concern  . Not on file   Social History Narrative   Patient is right handed, resides alone, consumes caffeine twice daily   Review of Systems  Constitutional: Negative for fever, fatigue and unexpected weight change.  Eyes: Negative for visual disturbance.  Respiratory: Negative for cough, chest tightness and shortness of breath.   Cardiovascular: Negative for chest pain, palpitations and leg swelling.  Gastrointestinal: Negative for abdominal pain and blood in stool.  Genitourinary: Positive for frequency.  Musculoskeletal: Negative for myalgias.  Neurological: Negative for dizziness, light-headedness and headaches.      Objective:   Physical Exam  Constitutional: He is oriented to person, place, and time. He appears well-developed and well-nourished.  HENT:  Head: Normocephalic and atraumatic.  Eyes: EOM are normal. Pupils are equal, round, and reactive to light.  Neck: No JVD present. Carotid bruit is not present.  Cardiovascular: Normal rate, regular rhythm and normal heart sounds.   No murmur heard. Pulmonary/Chest: Effort normal and breath sounds normal. He has no rales.  Abdominal: Soft. He exhibits no distension. There is no tenderness.  Musculoskeletal: He exhibits no edema.  Neurological: He is alert and oriented to person, place, and time.  Skin: Skin is warm and dry.  Psychiatric: He has a normal mood and affect.  Nursing note and vitals reviewed.  Filed Vitals:   11/30/14 1413  BP: 110/72  Pulse: 83  Temp: 98.9 F (37.2 C)  TempSrc: Oral  Resp: 16   Height: 5\' 6"  (1.676 m)  Weight: 171 lb 9.6 oz (77.837 kg)  SpO2: 96%     Results for orders placed or performed in visit on 11/30/14  POCT glucose (manual entry)  Result Value Ref Range   POC Glucose 92 70 - 99 mg/dl  POCT glycosylated hemoglobin (Hb A1C)  Result Value Ref Range   Hemoglobin A1C 5.9     Assessment & Plan:   Khaidyn Staebell is a 80 y.o. male Flu vaccine need - Plan: Flu Vaccine QUAD 36+ mos IM given  Essential hypertension - Plan: COMPLETE METABOLIC PANEL WITH GFR, Lipid panel, lisinopril (PRINIVIL,ZESTRIL) 10 MG tablet  -stable,and on low side of normal.  Will try to decrease to just lisinopril - d/c HCTZ for now and monitor home BP readings, see if this  helps with urinary sx's.  Hyperlipidemia - Plan: COMPLETE METABOLIC PANEL WITH GFR, Lipid panel pending.   Prediabetes - Plan: COMPLETE METABOLIC PANEL WITH GFR, Lipid panel, POCT glucose (manual entry), POCT glycosylated hemoglobin (Hb A1C). Borderline. Watch diet, continue activity to keep weight stable. Recheck in 3-6 months.   Screening for prostate cancer, requent urination at night , Nocturia more than twice per night - Plan: PSA, Medicare, CANCELED: PSA, Medicare  - trial off HCTZ, but if this does not improve - may need follow up with his urologist. Repeat PSA.   Elevated WBC count - Plan: CBC repeated. If remains elevated, consider hematology eval.   Need for prophylactic vaccination against Streptococcus pneumoniae (pneumococcus) - Plan: Pneumococcal conjugate vaccine 13-valent IM given.   Meds ordered this encounter  Medications  . lisinopril (PRINIVIL,ZESTRIL) 10 MG tablet    Sig: Take 1 tablet (10 mg total) by mouth daily.    Dispense:  90 tablet    Refill:  1  . atorvastatin (LIPITOR) 10 MG tablet    Sig: Take 1 tablet (10 mg total) by mouth daily.    Dispense:  90 tablet    Refill:  1   Patient Instructions  For the nighttime urination - can try to change your blood pressure medicine  and remove the diuretic. If this is not helping the frequency of urination at night over the next few weeks, then I would recommend folllowing up with your urologist to discuss further testing if needed, or other medication options for this.  Keep a record of your blood pressures outside of the office and as decreasing total dose of medicine today. If your pressures remain over 150/90 - may need to add another medicine. Call me if this occurs.  If blood count still elevated,  I may have you evaluated by blood specialist (hematology).  I will call you about this.  Blood sugar test better, but 3 month average still a little high. continue to watch diet and weight and we can recheck this test again in 6 months.  You should receive a call or letter about your lab results within the next week to 10 days.  Recheck in 6 months, Return to the clinic or go to the nearest emergency room if any of your symptoms worsen or new symptoms occur.     I personally performed the services described in this documentation, which was scribed in my presence. The recorded information has been reviewed and considered, and addended by me as needed.

## 2014-11-30 NOTE — Patient Instructions (Addendum)
For the nighttime urination - can try to change your blood pressure medicine and remove the diuretic. If this is not helping the frequency of urination at night over the next few weeks, then I would recommend folllowing up with your urologist to discuss further testing if needed, or other medication options for this.  Keep a record of your blood pressures outside of the office and as decreasing total dose of medicine today. If your pressures remain over 150/90 - may need to add another medicine. Call me if this occurs.  If blood count still elevated,  I may have you evaluated by blood specialist (hematology).  I will call you about this.  Blood sugar test better, but 3 month average still a little high. continue to watch diet and weight and we can recheck this test again in 6 months.  You should receive a call or letter about your lab results within the next week to 10 days.  Recheck in 6 months, Return to the clinic or go to the nearest emergency room if any of your symptoms worsen or new symptoms occur.

## 2014-12-04 DIAGNOSIS — H47011 Ischemic optic neuropathy, right eye: Secondary | ICD-10-CM | POA: Diagnosis not present

## 2014-12-04 DIAGNOSIS — H40053 Ocular hypertension, bilateral: Secondary | ICD-10-CM | POA: Diagnosis not present

## 2014-12-04 DIAGNOSIS — H2513 Age-related nuclear cataract, bilateral: Secondary | ICD-10-CM | POA: Diagnosis not present

## 2014-12-04 DIAGNOSIS — H43813 Vitreous degeneration, bilateral: Secondary | ICD-10-CM | POA: Diagnosis not present

## 2014-12-04 IMAGING — CR DG TIBIA/FIBULA 2V*R*
3 series · 3 of 3 positions shown · non-contrast
Comparison: None.

CLINICAL DATA: Leg pain, fall.

EXAM:
RIGHT TIBIA AND FIBULA - 2 VIEW

[AP]
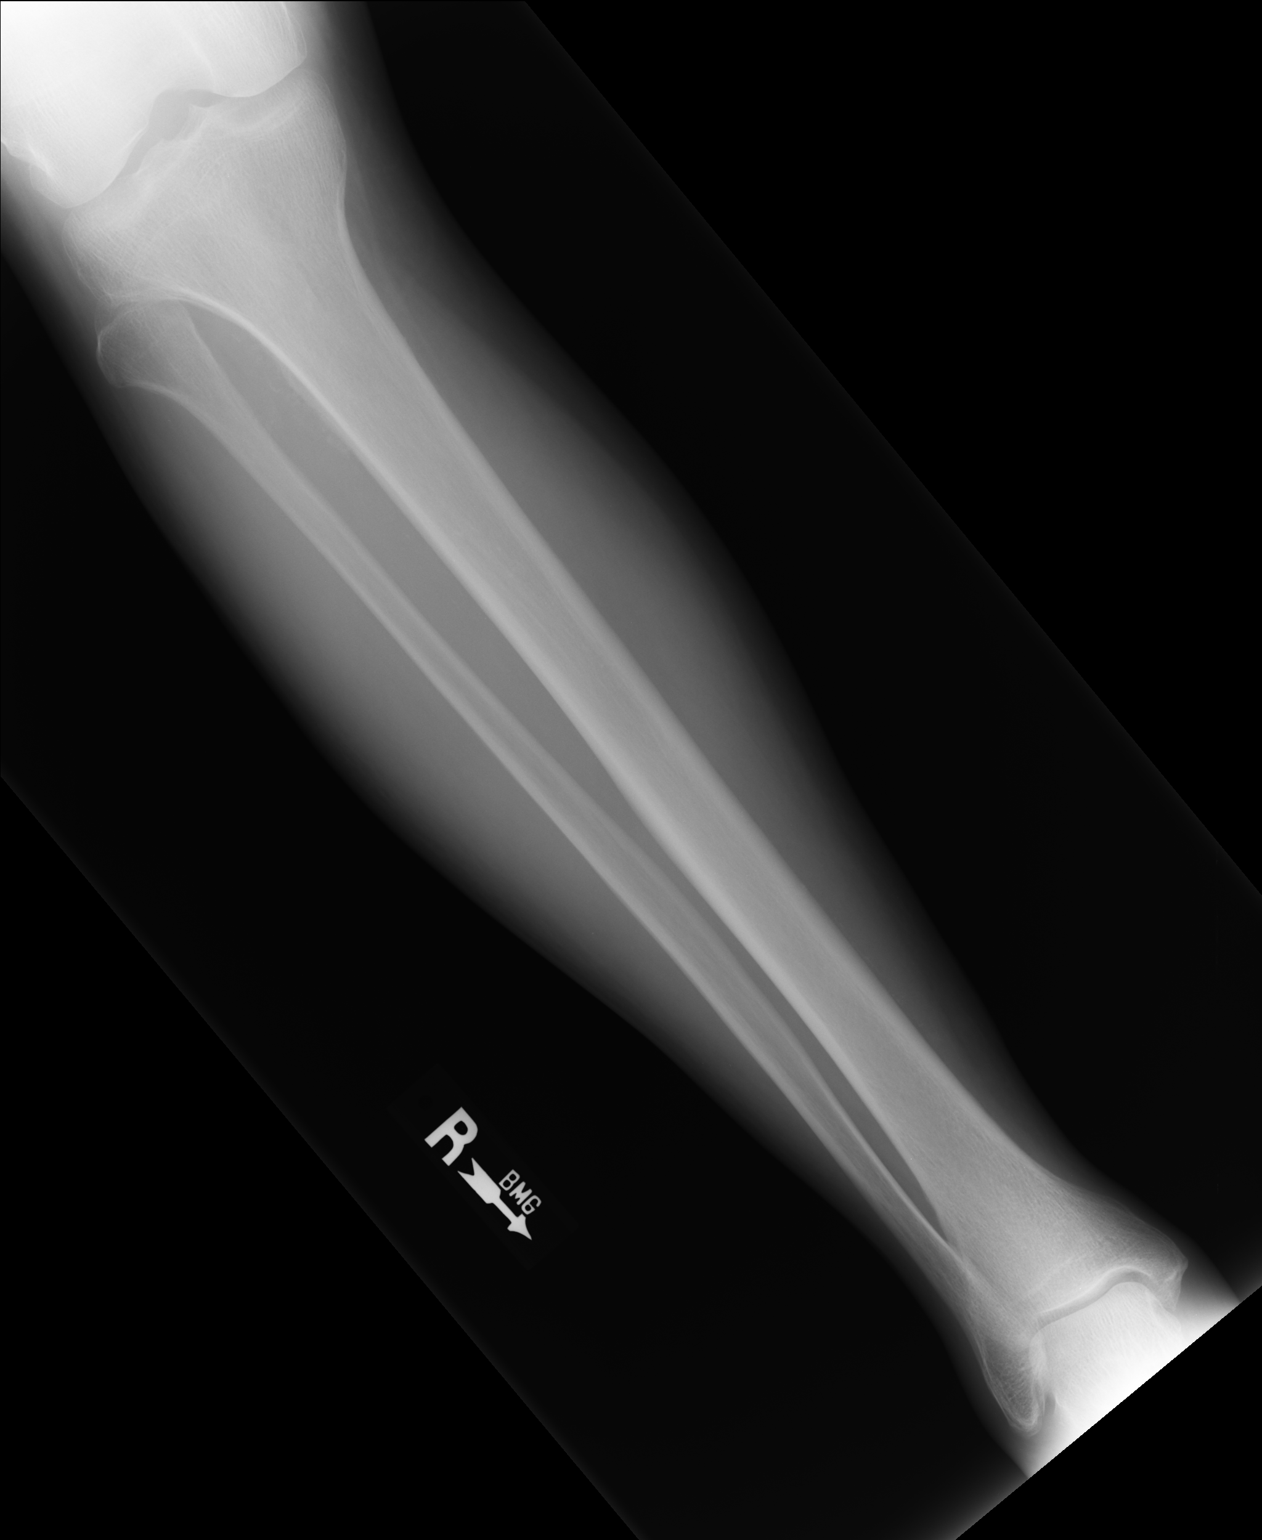

[lateral (1 of 2)]
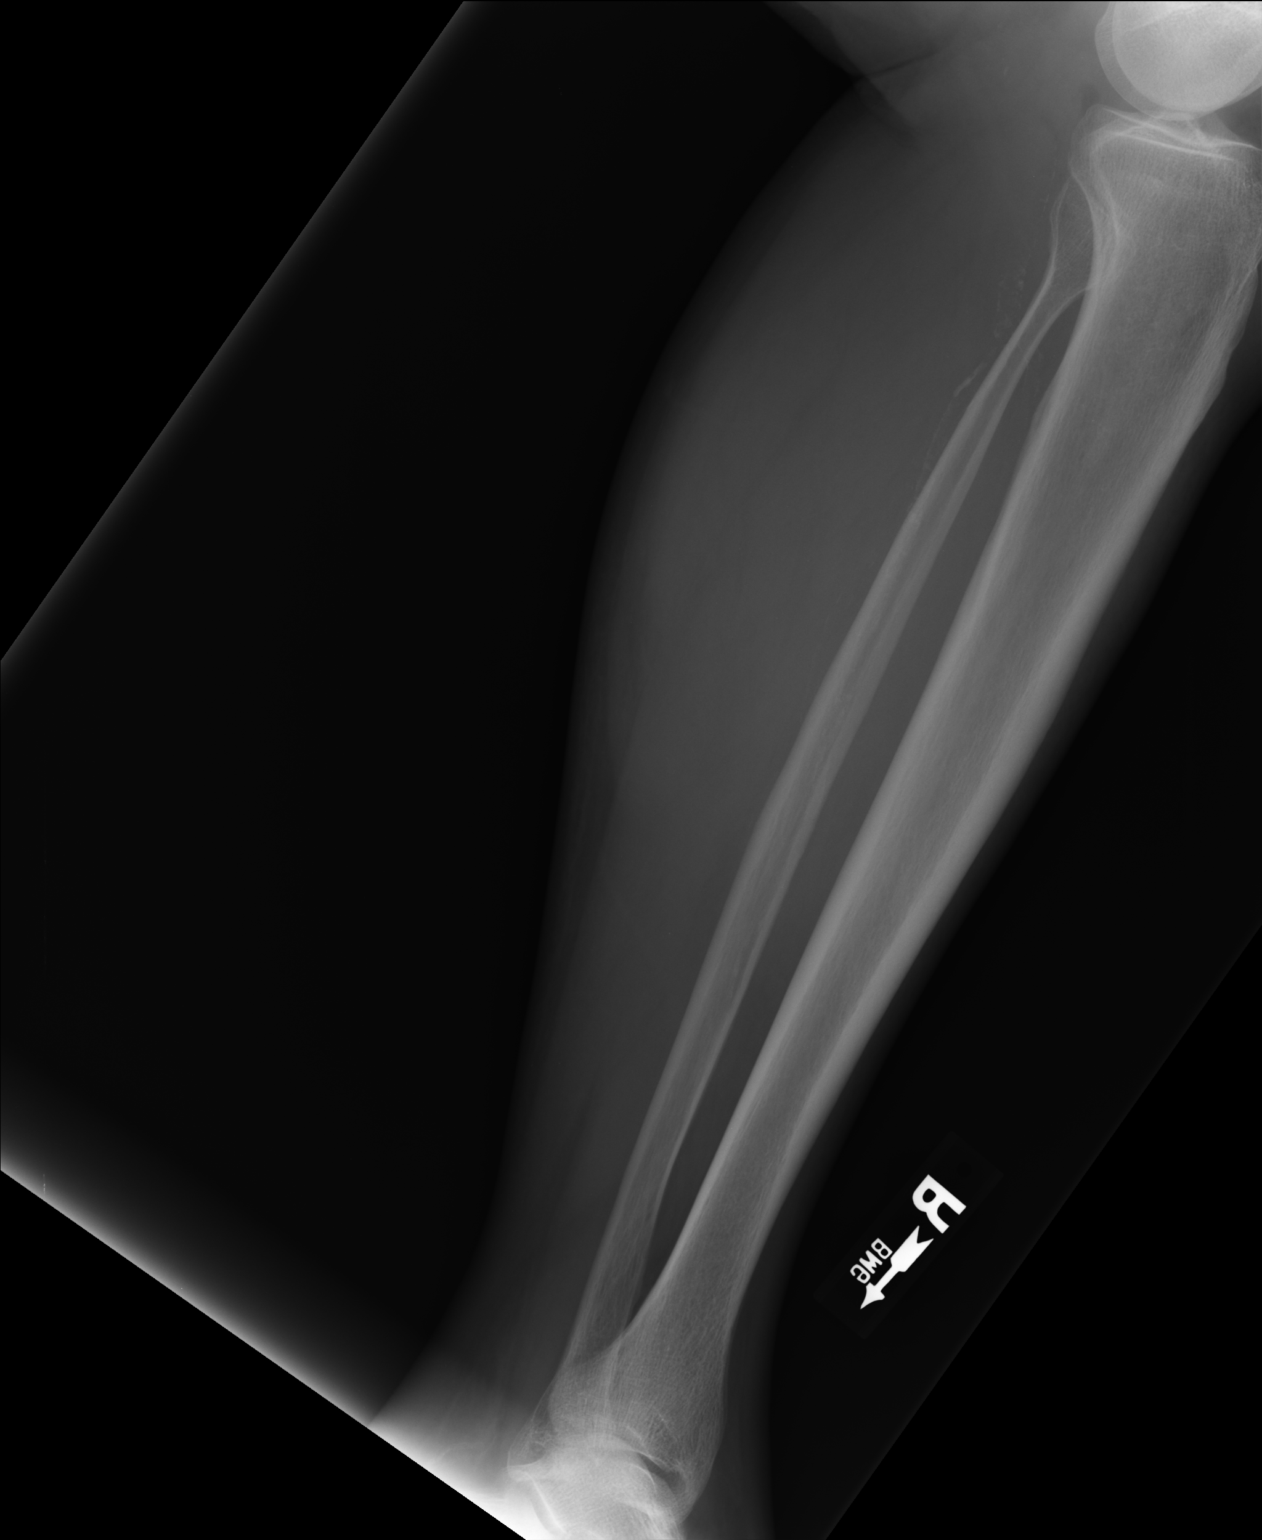

[lateral (2 of 2)]
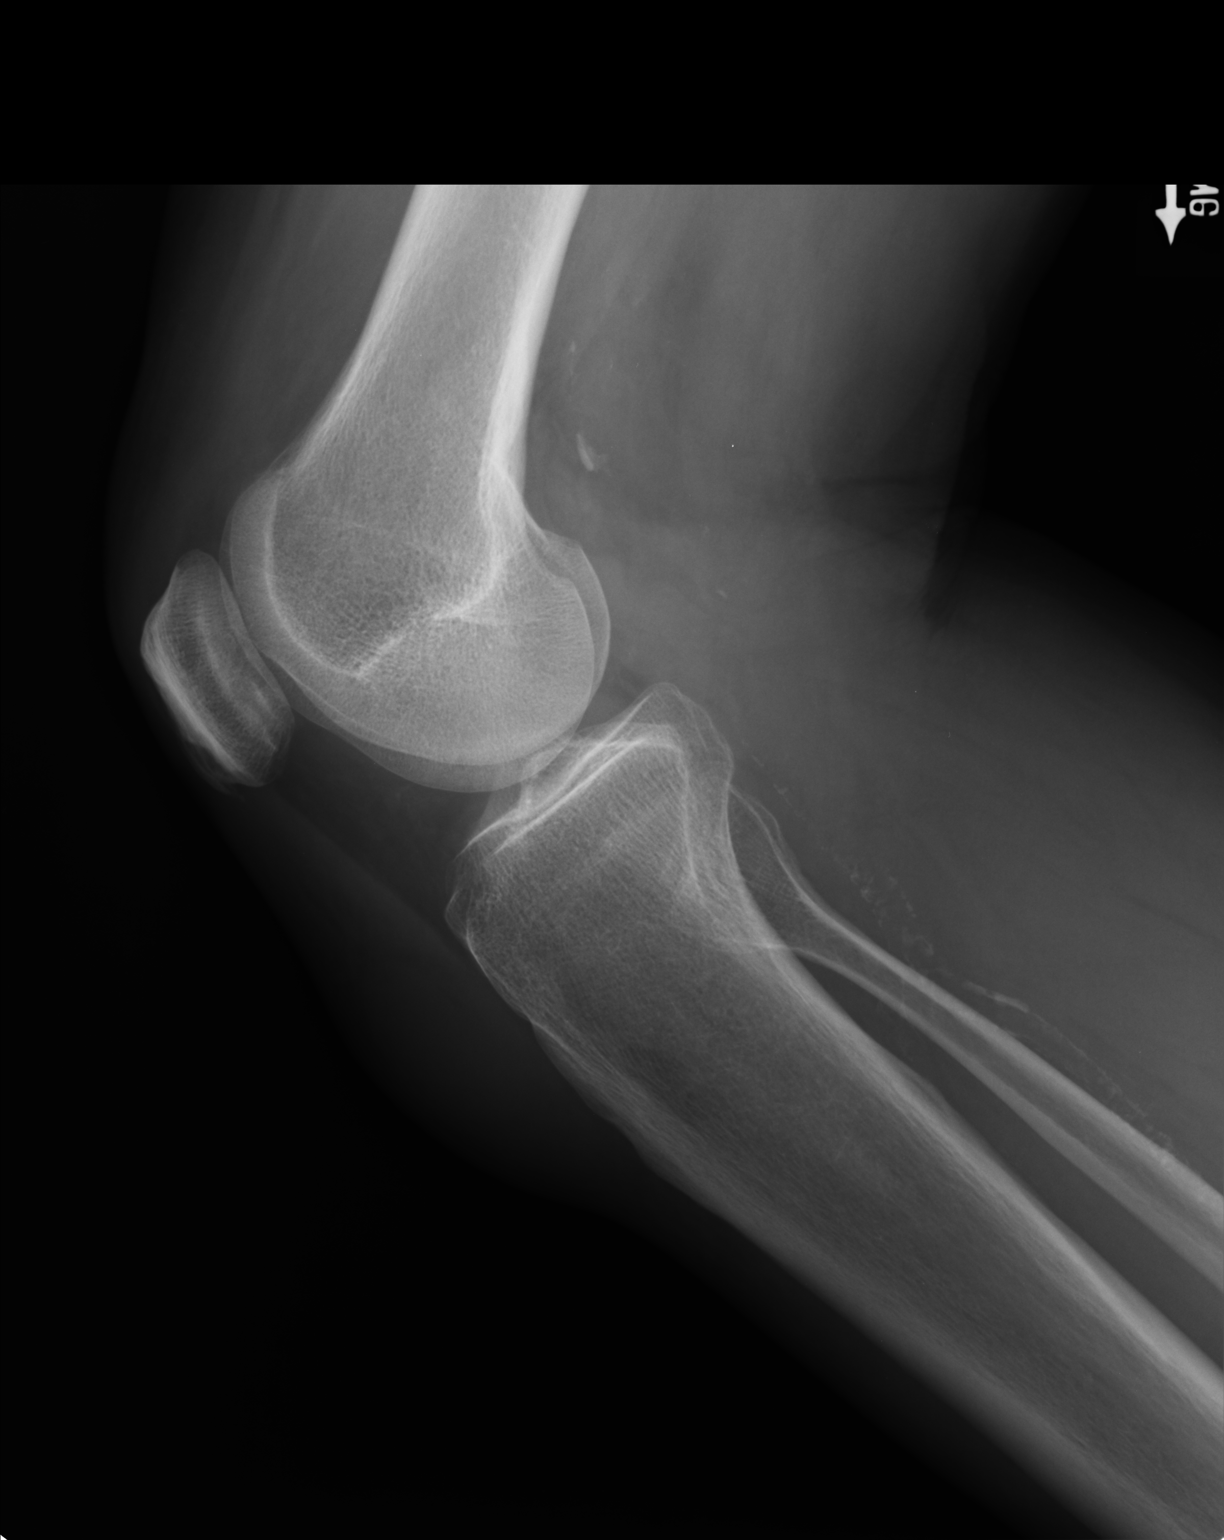

[3 of 3 positions shown; findings below may reference images not displayed]

FINDINGS: There is no evidence of fracture or other focal bone lesions. Soft
tissues are unremarkable.
IMPRESSION: Negative.

## 2014-12-17 ENCOUNTER — Encounter: Payer: Self-pay | Admitting: Family Medicine

## 2015-02-16 DIAGNOSIS — L821 Other seborrheic keratosis: Secondary | ICD-10-CM | POA: Diagnosis not present

## 2015-02-16 DIAGNOSIS — D225 Melanocytic nevi of trunk: Secondary | ICD-10-CM | POA: Diagnosis not present

## 2015-02-16 DIAGNOSIS — L814 Other melanin hyperpigmentation: Secondary | ICD-10-CM | POA: Diagnosis not present

## 2015-02-16 DIAGNOSIS — D485 Neoplasm of uncertain behavior of skin: Secondary | ICD-10-CM | POA: Diagnosis not present

## 2015-02-16 DIAGNOSIS — L570 Actinic keratosis: Secondary | ICD-10-CM | POA: Diagnosis not present

## 2015-03-30 DIAGNOSIS — L821 Other seborrheic keratosis: Secondary | ICD-10-CM | POA: Diagnosis not present

## 2015-03-30 DIAGNOSIS — D225 Melanocytic nevi of trunk: Secondary | ICD-10-CM | POA: Diagnosis not present

## 2015-03-30 DIAGNOSIS — L570 Actinic keratosis: Secondary | ICD-10-CM | POA: Diagnosis not present

## 2015-04-20 DIAGNOSIS — H25013 Cortical age-related cataract, bilateral: Secondary | ICD-10-CM | POA: Diagnosis not present

## 2015-04-20 DIAGNOSIS — H40053 Ocular hypertension, bilateral: Secondary | ICD-10-CM | POA: Diagnosis not present

## 2015-04-20 DIAGNOSIS — H2513 Age-related nuclear cataract, bilateral: Secondary | ICD-10-CM | POA: Diagnosis not present

## 2015-06-02 ENCOUNTER — Other Ambulatory Visit: Payer: Self-pay | Admitting: Family Medicine

## 2015-06-07 ENCOUNTER — Encounter: Payer: Self-pay | Admitting: Family Medicine

## 2015-06-07 ENCOUNTER — Ambulatory Visit (INDEPENDENT_AMBULATORY_CARE_PROVIDER_SITE_OTHER): Payer: Medicare Other | Admitting: Family Medicine

## 2015-06-07 VITALS — BP 109/69 | HR 85 | Temp 98.8°F | Resp 16 | Ht 66.0 in | Wt 164.4 lb

## 2015-06-07 DIAGNOSIS — H9193 Unspecified hearing loss, bilateral: Secondary | ICD-10-CM

## 2015-06-07 DIAGNOSIS — R351 Nocturia: Secondary | ICD-10-CM | POA: Diagnosis not present

## 2015-06-07 DIAGNOSIS — R350 Frequency of micturition: Secondary | ICD-10-CM | POA: Diagnosis not present

## 2015-06-07 DIAGNOSIS — E785 Hyperlipidemia, unspecified: Secondary | ICD-10-CM

## 2015-06-07 DIAGNOSIS — I10 Essential (primary) hypertension: Secondary | ICD-10-CM | POA: Diagnosis not present

## 2015-06-07 DIAGNOSIS — N400 Enlarged prostate without lower urinary tract symptoms: Secondary | ICD-10-CM | POA: Diagnosis not present

## 2015-06-07 DIAGNOSIS — R7303 Prediabetes: Secondary | ICD-10-CM

## 2015-06-07 DIAGNOSIS — R7309 Other abnormal glucose: Secondary | ICD-10-CM

## 2015-06-07 LAB — LIPID PANEL
CHOL/HDL RATIO: 3.3 ratio
Cholesterol: 173 mg/dL (ref 0–200)
HDL: 53 mg/dL (ref >=40)
LDL CALC: 99 mg/dL (ref 0–99)
Triglycerides: 106 mg/dL (ref <150)
VLDL: 21 mg/dL (ref 0–40)

## 2015-06-07 LAB — COMPLETE METABOLIC PANEL WITH GFR
ALT: 18 U/L (ref 0–53)
AST: 17 U/L (ref 0–37)
Albumin: 4.2 g/dL (ref 3.5–5.2)
Alkaline Phosphatase: 54 U/L (ref 39–117)
BUN: 15 mg/dL (ref 6–23)
CO2: 28 mEq/L (ref 19–32)
Calcium: 10 mg/dL (ref 8.4–10.5)
Chloride: 102 mEq/L (ref 96–112)
Creat: 0.85 mg/dL (ref 0.50–1.35)
GFR, Est African American: >89 mL/min
GFR, Est Non African American: 78 mL/min
GLUCOSE: 106 mg/dL — AB (ref 70–99)
Potassium: 4.5 mEq/L (ref 3.5–5.3)
Sodium: 140 mEq/L (ref 135–145)
Total Bilirubin: 1.3 mg/dL — ABNORMAL HIGH (ref 0.2–1.2)
Total Protein: 7.4 g/dL (ref 6.0–8.3)

## 2015-06-07 LAB — POCT GLYCOSYLATED HEMOGLOBIN (HGB A1C): Hemoglobin A1C: 5.9

## 2015-06-07 MED ORDER — LISINOPRIL 10 MG PO TABS: ORAL_TABLET | ORAL | Status: DC

## 2015-06-07 MED ORDER — ATORVASTATIN CALCIUM 10 MG PO TABS: 10.0000 mg | ORAL_TABLET | Freq: Every day | ORAL | Status: DC

## 2015-06-07 NOTE — Progress Notes (Addendum)
Subjective:    Patient ID: Nicholas Finley, male    DOB: July 25, 1926, 81 y.o.   MRN: [id number] This chart was scribed for Merri Ray, MD by Zola Button, Medical Scribe. This patient was seen in Room 21 and the patient's care was started at 1:19 PM.    HPI HPI Comments: Nicholas Finley is a 81 y.o. male with a hx of BPH, hearing difficulty, hypertension and hyperlipidemia who presents to the [facility] for a follow-up. Patient is fasting today.  Hypertension: On lisinopril 10 mg qd. We decreased to just lisinopril last visit, discontinued HCTZ as borderline low readings. Patient notes his blood pressure has been fluctuating, as high as 170. This usually comes back down after he takes his medications. Patient denies chest pain and SOB. Lab Results  Component Value Date   CREATININE 0.80 11/30/2014    Pre-Diabetes: A1c 5.9 on December visit. Advised to watch diet and weight. He is down 7 pounds from last visit. Wt Readings from Last 3 Encounters:  06/07/15 164 lb 6.4 oz (74.571 kg)  11/30/14 171 lb 9.6 oz (77.837 kg)  08/27/14 178 lb (80.74 kg)    Hyperlipidemia: He takes Lipitor 10 mg qd. Lab Results  Component Value Date   CHOL 167 11/30/2014   HDL 44 11/30/2014   LDLCALC 96 11/30/2014   TRIG 133 11/30/2014   CHOLHDL 3.8 11/30/2014    Urinary Frequency, Nocturia: He has history of BPH, followed by his urologist, [name]. Stopped HCTZ last visit. Last PSA 1 year ago. His nocturia has stayed about the same, urinating about every 2 hours at night or sometimes just once a night. He last saw [name] over a year ago. He does not feel the need to follow up with [name] again as his nocturia does not bother him. Patient denies fever, dysuria, and hematuria. Lab Results  Component Value Date   PSA 0.93 05/13/2014    Hearing Difficulty: Patient does not think his hearing has worsened. He had been told he may not get much benefit from hearing  aids.  Leg Weakness: Patient notes that his legs felt slightly weaker than normal when walking at the [facility] a few days ago. He only noticed this on the way back, not walking there. This is not a persistent or continuing problem.  Patient Active Problem List   Diagnosis Date Noted  . HTN (hypertension) 03/01/2012  . Hyperlipidemia 03/01/2012  . Hearing difficulty 03/01/2012  . Allergic rhinitis 03/01/2012   Past Medical History  Diagnosis Date  . Enlarged prostate   . Hypertension   . Hypercholesteremia   . Allergy   . Cataract   . Heart murmur    Past Surgical History  Procedure Laterality Date  . Skin cancer excision    . Cyst excision      pilonidal   No Known Allergies Prior to Admission medications   Medication Sig Start Date End Date Taking? Authorizing Provider  aspirin 81 MG tablet Take 81 mg by mouth daily.   Yes Historical Provider, MD  ASTEPRO 0.15 % SOLN instill 1 spray into each nostril twice a day if needed 04/21/13  Yes Heather M Marte, PA-C  atorvastatin (LIPITOR) 10 MG tablet Take 1 tablet (10 mg total) by mouth daily. 11/30/14  Yes [name], MD  lisinopril (PRINIVIL,ZESTRIL) 10 MG tablet TAKE 1 TABLET BY MOUTH ONCE DAILY. 06/02/15  Yes Harrison Mons, PA-C   History   Social History  .  Marital Status: Single    Spouse Name: N/A  . Number of Children: 0  . Years of Education: 16   Occupational History  .      retired   Social History Main Topics  . Smoking status: Current Some Day Smoker    Types: Cigarettes, Pipe, Cigars  . Smokeless tobacco: Never Used     Comment: cigars   . Alcohol Use: 0.0 oz/week    0 Standard drinks or equivalent per week     Comment: occas beer  . Drug Use: No  . Sexual Activity: Not on file   Other Topics Concern  . Not on file   Social History Narrative   Patient is right handed, resides alone, consumes caffeine twice daily     Review of Systems  Constitutional: Negative for fever,  fatigue and unexpected weight change.  Eyes: Negative for visual disturbance.  Respiratory: Negative for cough, chest tightness and shortness of breath.   Cardiovascular: Negative for chest pain, palpitations and leg swelling.  Gastrointestinal: Negative for abdominal pain and blood in stool.  Genitourinary: Negative for dysuria and hematuria.  Neurological: Negative for dizziness, light-headedness and headaches.       Objective:   Physical Exam  Constitutional: He is oriented to person, place, and time. He appears well-developed and well-nourished.  HENT:  Head: Normocephalic and atraumatic.  Eyes: EOM are normal. Pupils are equal, round, and reactive to light.  Neck: No JVD present. Carotid bruit is not present.  Cardiovascular: Normal rate, regular rhythm and normal heart sounds.   No murmur heard. Pulmonary/Chest: Effort normal and breath sounds normal. He has no rales.  Musculoskeletal: He exhibits no edema.  Neurological: He is alert and oriented to person, place, and time.  Skin: Skin is warm and dry.  Psychiatric: He has a normal mood and affect.  Vitals reviewed.     Filed Vitals:   06/07/15 1300  BP: 109/69  Pulse: 85  Temp: 98.8 F (37.1 C)  TempSrc: Oral  Resp: 16  Height: 5\' 6"  (1.676 m)  Weight: 164 lb 6.4 oz (74.571 kg)  SpO2: 94%    Results for orders placed or performed in visit on 06/07/15  POCT glycosylated hemoglobin (Hb A1C)  Result Value Ref Range   Hemoglobin A1C 5.9         Assessment & Plan:   Nicholas Finley is a 81 y.o. male Nocturia -Urinary frequency, BPH (benign prostatic hyperplasia) - Plan: PSA  - Plan: PSA repeat. Offered urology eval, but as intermittent - declined at present.   Essential hypertension - Plan: COMPLETE METABOLIC PANEL WITH GFR  -stable. No med changes. Suspect outide readings not accurate. If checking on home monitor - can bring to next OV to check accuracy.   Hyperlipidemia - Plan: Lipid panel pending.  Continue Lipitor same dose for now.   Prediabetes - Plan: POCT glycosylated hemoglobin (Hb A1C)  - stable.  Cont to watch diet and exercise.   Hearing difficulty of both ears  - recommended audiology eval and hearing aids may be helpful.   Meds ordered this encounter  Medications  . lisinopril (PRINIVIL,ZESTRIL) 10 MG tablet    Sig: TAKE 1 TABLET BY MOUTH ONCE DAILY.    Dispense:  90 tablet    Refill:  0  . atorvastatin (LIPITOR) 10 MG tablet    Sig: Take 1 tablet (10 mg total) by mouth daily.    Dispense:  90 tablet    Refill:  1  Patient Instructions  You should receive a call or letter about your lab results within the next week to 10 days.  Prediabetes test stable. Continue to watch diet. Recheck these levels in 6 months.  If you are having more trouble with your hearing or difficulty follow ing conversations - I would recommend looking into hearing aids again.  Return to the clinic or go to the nearest emergency room if any of your symptoms worsen or new symptoms occur. Keep a record of your blood pressures outside of the office but bring your monitor to the next office visit as it may not be obtaining accurate readings.  If your legs feel weak again, or this occurs more frequently- return to discuss further.  Return to the clinic or go to the nearest emergency room if any of your symptoms worsen or new symptoms occur.       I personally performed the services described in this documentation, which was scribed in my presence. The recorded information has been reviewed and considered, and addended by me as needed.

## 2015-06-07 NOTE — Patient Instructions (Addendum)
You should receive a call or letter about your lab results within the next week to 10 days.  Prediabetes test stable. Continue to watch diet. Recheck these levels in 6 months.  If you are having more trouble with your hearing or difficulty follow ing conversations - I would recommend looking into hearing aids again.  Return to the clinic or go to the nearest emergency room if any of your symptoms worsen or new symptoms occur. Keep a record of your blood pressures outside of the office but bring your monitor to the next office visit as it may not be obtaining accurate readings.  If your legs feel weak again, or this occurs more frequently- return to discuss further.  Return to the clinic or go to the nearest emergency room if any of your symptoms worsen or new symptoms occur.

## 2015-06-08 ENCOUNTER — Encounter: Payer: Self-pay | Admitting: Family Medicine

## 2015-06-08 LAB — PSA: PSA: 0.68 ng/mL (ref <=4.00)

## 2015-06-16 DIAGNOSIS — H25811 Combined forms of age-related cataract, right eye: Secondary | ICD-10-CM | POA: Diagnosis not present

## 2015-06-16 DIAGNOSIS — H2511 Age-related nuclear cataract, right eye: Secondary | ICD-10-CM | POA: Diagnosis not present

## 2015-06-16 DIAGNOSIS — H25011 Cortical age-related cataract, right eye: Secondary | ICD-10-CM | POA: Diagnosis not present

## 2015-07-29 DIAGNOSIS — H25012 Cortical age-related cataract, left eye: Secondary | ICD-10-CM | POA: Diagnosis not present

## 2015-07-29 DIAGNOSIS — H25812 Combined forms of age-related cataract, left eye: Secondary | ICD-10-CM | POA: Diagnosis not present

## 2015-07-29 DIAGNOSIS — H2512 Age-related nuclear cataract, left eye: Secondary | ICD-10-CM | POA: Diagnosis not present

## 2015-09-28 DIAGNOSIS — L570 Actinic keratosis: Secondary | ICD-10-CM | POA: Diagnosis not present

## 2015-09-28 DIAGNOSIS — D229 Melanocytic nevi, unspecified: Secondary | ICD-10-CM | POA: Diagnosis not present

## 2015-09-28 DIAGNOSIS — L309 Dermatitis, unspecified: Secondary | ICD-10-CM | POA: Diagnosis not present

## 2015-12-06 ENCOUNTER — Ambulatory Visit (INDEPENDENT_AMBULATORY_CARE_PROVIDER_SITE_OTHER): Payer: Medicare Other | Admitting: Family Medicine

## 2015-12-06 ENCOUNTER — Other Ambulatory Visit: Payer: Self-pay | Admitting: Family Medicine

## 2015-12-06 ENCOUNTER — Encounter: Payer: Self-pay | Admitting: Family Medicine

## 2015-12-06 VITALS — BP 173/84 | HR 79 | Temp 98.4°F | Resp 16 | Ht 67.0 in | Wt 164.0 lb

## 2015-12-06 DIAGNOSIS — E785 Hyperlipidemia, unspecified: Secondary | ICD-10-CM | POA: Diagnosis not present

## 2015-12-06 DIAGNOSIS — R7303 Prediabetes: Secondary | ICD-10-CM

## 2015-12-06 DIAGNOSIS — Z23 Encounter for immunization: Secondary | ICD-10-CM

## 2015-12-06 DIAGNOSIS — R739 Hyperglycemia, unspecified: Secondary | ICD-10-CM

## 2015-12-06 DIAGNOSIS — I10 Essential (primary) hypertension: Secondary | ICD-10-CM

## 2015-12-06 DIAGNOSIS — R7309 Other abnormal glucose: Secondary | ICD-10-CM | POA: Diagnosis not present

## 2015-12-06 LAB — LIPID PANEL
Cholesterol: 155 mg/dL (ref 125–200)
HDL: 53 mg/dL (ref >=40)
LDL CALC: 84 mg/dL (ref <130)
TRIGLYCERIDES: 91 mg/dL (ref <150)
Total CHOL/HDL Ratio: 2.9 Ratio (ref <=5.0)
VLDL: 18 mg/dL (ref <30)

## 2015-12-06 LAB — COMPLETE METABOLIC PANEL WITH GFR
ALT: 14 U/L (ref 9–46)
AST: 16 U/L (ref 10–35)
Albumin: 3.9 g/dL (ref 3.6–5.1)
Alkaline Phosphatase: 50 U/L (ref 40–115)
BILIRUBIN TOTAL: 1.1 mg/dL (ref 0.2–1.2)
BUN: 13 mg/dL (ref 7–25)
CHLORIDE: 103 mmol/L (ref 98–110)
CO2: 26 mmol/L (ref 20–31)
Calcium: 9.4 mg/dL (ref 8.6–10.3)
Creat: 0.62 mg/dL — ABNORMAL LOW (ref 0.70–1.11)
GFR, EST NON AFRICAN AMERICAN: 89 mL/min (ref >=60)
Glucose, Bld: 86 mg/dL (ref 65–99)
Potassium: 4.3 mmol/L (ref 3.5–5.3)
Sodium: 139 mmol/L (ref 135–146)
Total Protein: 7 g/dL (ref 6.1–8.1)

## 2015-12-06 MED ORDER — ATORVASTATIN CALCIUM 10 MG PO TABS: 10.0000 mg | ORAL_TABLET | Freq: Every day | ORAL | Status: DC

## 2015-12-06 MED ORDER — LISINOPRIL 10 MG PO TABS: ORAL_TABLET | ORAL | Status: DC

## 2015-12-06 NOTE — Patient Instructions (Addendum)
Keep a record of your blood pressures outside of the office and if remaining over 150/90 - return to discuss changes.   You should receive a call or letter about your lab results within the next week to 10 days.     Influenza Virus Vaccine injection What is this medicine? INFLUENZA VIRUS VACCINE (in floo EN zuh VAHY ruhs vak SEEN) helps to reduce the risk of getting influenza also known as the flu. The vaccine only helps protect you against some strains of the flu. This medicine may be used for other purposes; ask your health care provider or pharmacist if you have questions. What should I tell my health care provider before I take this medicine? They need to know if you have any of these conditions: -bleeding disorder like hemophilia -fever or infection -Guillain-Barre syndrome or other neurological problems -immune system problems -infection with the human immunodeficiency virus (HIV) or AIDS -low blood platelet counts -multiple sclerosis -an unusual or allergic reaction to influenza virus vaccine, latex, other medicines, foods, dyes, or preservatives. Different brands of vaccines contain different allergens. Some may contain latex or eggs. Talk to your doctor about your allergies to make sure that you get the right vaccine. -pregnant or trying to get pregnant -breast-feeding How should I use this medicine? This vaccine is for injection into a muscle or under the skin. It is given by a health care professional. A copy of Vaccine Information Statements will be given before each vaccination. Read this sheet carefully each time. The sheet may change frequently. Talk to your healthcare provider to see which vaccines are right for you. Some vaccines should not be used in all age groups. Overdosage: If you think you have taken too much of this medicine contact a poison control center or emergency room at once. NOTE: This medicine is only for you. Do not share this medicine with others. What  if I miss a dose? This does not apply. What may interact with this medicine? -chemotherapy or radiation therapy -medicines that lower your immune system like etanercept, anakinra, infliximab, and adalimumab -medicines that treat or prevent blood clots like warfarin -phenytoin -steroid medicines like prednisone or cortisone -theophylline -vaccines This list may not describe all possible interactions. Give your health care provider a list of all the medicines, herbs, non-prescription drugs, or dietary supplements you use. Also tell them if you smoke, drink alcohol, or use illegal drugs. Some items may interact with your medicine. What should I watch for while using this medicine? Report any side effects that do not go away within 3 days to your doctor or health care professional. Call your health care provider if any unusual symptoms occur within 6 weeks of receiving this vaccine. You may still catch the flu, but the illness is not usually as bad. You cannot get the flu from the vaccine. The vaccine will not protect against colds or other illnesses that may cause fever. The vaccine is needed every year. What side effects may I notice from receiving this medicine? Side effects that you should report to your doctor or health care professional as soon as possible: -allergic reactions like skin rash, itching or hives, swelling of the face, lips, or tongue Side effects that usually do not require medical attention (report to your doctor or health care professional if they continue or are bothersome): -fever -headache -muscle aches and pains -pain, tenderness, redness, or swelling at the injection site -tiredness This list may not describe all possible side effects. Call your doctor  for medical advice about side effects. You may report side effects to FDA at 1-800-FDA-1088. Where should I keep my medicine? The vaccine will be given by a health care professional in a clinic, pharmacy, doctor's  office, or other health care setting. You will not be given vaccine doses to store at home. NOTE: This sheet is a summary. It may not cover all possible information. If you have questions about this medicine, talk to your doctor, pharmacist, or health care provider.    2016, Elsevier/Gold Standard. (2015-07-02 10:07:28)

## 2015-12-06 NOTE — Progress Notes (Addendum)
Subjective:  By signing my name below, I, Raven Small, attest that this documentation has been prepared under the direction and in the presence of Merri Ray, MD.  Electronically Signed: Thea Alken, ED Scribe. 12/06/2015. 1:04 PM.   Patient ID: Nicholas Finley, male    DOB: 03/17/1926, 81 y.o.   MRN: [id number]  HPI   Chief Complaint  Patient presents with  . Follow-up    HTN  . Flu Vaccine   HPI Comments: Nicholas Finley is a 81 y.o. male who presents to the [facility] for a follow up. Last seen in June  Hypertension   Lab Results  Component Value Date   CREATININE 0.85 06/07/2015   Borderline readings at last visit,11/2014. Discontinued HCTZ. Continued on lisinopril 10mg  qd. BP at that visit was 109/69. Pt states he forgot to take his lisinopril today but otherwise is usually compliant. He does not BP outside of office Pt denies chronic cough or other symptoms with medication.    He reports occasional dizziness when climbing steps, having to grab the railing for stability. He denies falls with dizziness.  Pt had cataract surgery about 6 months ago.   Hyperlipidemia    Lab Results  Component Value Date   CHOL 173 06/07/2015   HDL 53 06/07/2015   LDLCALC 99 06/07/2015   TRIG 106 06/07/2015   CHOLHDL 3.3 06/07/2015   Lab Results  Component Value Date   ALT 18 06/07/2015   AST 17 06/07/2015   ALKPHOS 54 06/07/2015   BILITOT 1.3* 06/07/2015   He is on Lipitor. Stable reading last visit as well as 1 year ago. Pt is fasting.   Pre diabetes  Lab Results  Component Value Date   HGBA1C 5.9 06/07/2015   Wt Readings from Last 3 Encounters:  12/06/15 164 lb (74.39 kg)  06/07/15 164 lb 6.4 oz (74.571 kg)  11/30/14 171 lb 9.6 oz (77.837 kg)   Discussed watching diet and activity.   Health maintenance Will receive flu shot today.   Patient Active Problem List   Diagnosis Date Noted  . HTN (hypertension) 03/01/2012  . Hyperlipidemia  03/01/2012  . Hearing difficulty 03/01/2012  . Allergic rhinitis 03/01/2012   Past Medical History  Diagnosis Date  . Enlarged prostate   . Hypertension   . Hypercholesteremia   . Allergy   . Cataract   . Heart murmur    Past Surgical History  Procedure Laterality Date  . Skin cancer excision    . Cyst excision      pilonidal   No Known Allergies Prior to Admission medications   Medication Sig Start Date End Date Taking? Authorizing Provider  aspirin 81 MG tablet Take 81 mg by mouth daily.   Yes Historical Provider, MD  atorvastatin (LIPITOR) 10 MG tablet Take 1 tablet (10 mg total) by mouth daily. 06/07/15  Yes [name], MD  lisinopril (PRINIVIL,ZESTRIL) 10 MG tablet TAKE 1 TABLET BY MOUTH ONCE DAILY. 06/07/15  Yes [name], MD  ASTEPRO 0.15 % SOLN instill 1 spray into each nostril twice a day if needed Patient not taking: Reported on 12/06/2015 04/21/13   Collene Leyden, PA-C   Social History   Social History  . Marital Status: Single    Spouse Name: N/A  . Number of Children: 0  . Years of Education: 16   Occupational History  .      retired   Social History Main Topics  . Smoking status:  Current Some Day Smoker    Types: Cigarettes, Pipe, Cigars  . Smokeless tobacco: Never Used     Comment: cigars   . Alcohol Use: 0.0 oz/week    0 Standard drinks or equivalent per week     Comment: occas beer  . Drug Use: No  . Sexual Activity: Not on file   Other Topics Concern  . Not on file   Social History Narrative   Patient is right handed, resides alone, consumes caffeine twice daily   Review of Systems  Constitutional: Negative for fatigue and unexpected weight change.  Eyes: Negative for visual disturbance.  Respiratory: Negative for cough, chest tightness and shortness of breath.   Cardiovascular: Negative for chest pain, palpitations and leg swelling.  Gastrointestinal: Negative for abdominal pain and blood in stool.  Neurological: Positive  for dizziness. Negative for light-headedness and headaches.   Objective:   Physical Exam  Constitutional: He is oriented to person, place, and time. He appears well-developed and well-nourished. No distress.  HENT:  Head: Normocephalic and atraumatic.  Eyes: Conjunctivae and EOM are normal. Pupils are equal, round, and reactive to light.  Neck: Neck supple. No JVD present. Carotid bruit is not present.  Cardiovascular: Normal rate, regular rhythm and normal heart sounds.   No murmur heard. BP recheck, regular size cuff Left arm-148/82 Right arm- 156/82  Pulmonary/Chest: Effort normal and breath sounds normal. He has no rales.  Musculoskeletal: Normal range of motion. He exhibits no edema.  Neurological: He is alert and oriented to person, place, and time.  Skin: Skin is warm and dry.  Psychiatric: He has a normal mood and affect. His behavior is normal.  Nursing note and vitals reviewed.  Filed Vitals:   12/06/15 1257  BP: 173/84  Pulse: 79  Temp: 98.4 F (36.9 C)  TempSrc: Oral  Resp: 16  Height: 5\' 7"  (1.702 m)  Weight: 164 lb (74.39 kg)  SpO2: 96%   Assessment & Plan:   Nicholas Finley is a 81 y.o. male Essential hypertension - Plan: lisinopril (PRINIVIL,ZESTRIL) 10 MG tablet  -Elevated in office initially. Recheck slightly improved but still on the higher side.  Continue same dose of lisinopril for now, CMP pending. Check outside blood pressures, and if remaining over 150/90, return to discuss further.  Needs flu shot - Plan: Flu Vaccine QUAD 36+ mos IM given.  Hyperlipidemia - Plan: atorvastatin (LIPITOR) 10 MG tablet, COMPLETE METABOLIC PANEL WITH GFR,   Lipid panel  -Discussed statin use at his age, but decided to continue low-dose Lipitor. See above.  Prediabetes - Plan: Hemoglobin A1c, CANCELED: POCT glycosylated hemoglobin (Hb A1C) Hyperglycemia - Plan: Hemoglobin A1c, CANCELED: POCT glycosylated hemoglobin (Hb A1C)  -A1c pending. Continue to watch  diet.  Meds ordered this encounter  Medications  . atorvastatin (LIPITOR) 10 MG tablet    Sig: Take 1 tablet (10 mg total) by mouth daily.    Dispense:  90 tablet    Refill:  1  . lisinopril (PRINIVIL,ZESTRIL) 10 MG tablet    Sig: TAKE 1 TABLET BY MOUTH ONCE DAILY.    Dispense:  90 tablet    Refill:  0   Patient Instructions  Keep a record of your blood pressures outside of the office and if remaining over 150/90 - return to discuss changes.   You should receive a call or letter about your lab results within the next week to 10 days.     Influenza Virus Vaccine injection What is this medicine? INFLUENZA VIRUS  VACCINE (in floo EN zuh VAHY ruhs vak SEEN) helps to reduce the risk of getting influenza also known as the flu. The vaccine only helps protect you against some strains of the flu. This medicine may be used for other purposes; ask your health care provider or pharmacist if you have questions. What should I tell my health care provider before I take this medicine? They need to know if you have any of these conditions: -bleeding disorder like hemophilia -fever or infection -Guillain-Barre syndrome or other neurological problems -immune system problems -infection with the human immunodeficiency virus (HIV) or AIDS -low blood platelet counts -multiple sclerosis -an unusual or allergic reaction to influenza virus vaccine, latex, other medicines, foods, dyes, or preservatives. Different brands of vaccines contain different allergens. Some may contain latex or eggs. Talk to your doctor about your allergies to make sure that you get the right vaccine. -pregnant or trying to get pregnant -breast-feeding How should I use this medicine? This vaccine is for injection into a muscle or under the skin. It is given by a health care professional. A copy of Vaccine Information Statements will be given before each vaccination. Read this sheet carefully each time. The sheet may change  frequently. Talk to your healthcare provider to see which vaccines are right for you. Some vaccines should not be used in all age groups. Overdosage: If you think you have taken too much of this medicine contact a poison control center or emergency room at once. NOTE: This medicine is only for you. Do not share this medicine with others. What if I miss a dose? This does not apply. What may interact with this medicine? -chemotherapy or radiation therapy -medicines that lower your immune system like etanercept, anakinra, infliximab, and adalimumab -medicines that treat or prevent blood clots like warfarin -phenytoin -steroid medicines like prednisone or cortisone -theophylline -vaccines This list may not describe all possible interactions. Give your health care provider a list of all the medicines, herbs, non-prescription drugs, or dietary supplements you use. Also tell them if you smoke, drink alcohol, or use illegal drugs. Some items may interact with your medicine. What should I watch for while using this medicine? Report any side effects that do not go away within 3 days to your doctor or health care professional. Call your health care provider if any unusual symptoms occur within 6 weeks of receiving this vaccine. You may still catch the flu, but the illness is not usually as bad. You cannot get the flu from the vaccine. The vaccine will not protect against colds or other illnesses that may cause fever. The vaccine is needed every year. What side effects may I notice from receiving this medicine? Side effects that you should report to your doctor or health care professional as soon as possible: -allergic reactions like skin rash, itching or hives, swelling of the face, lips, or tongue Side effects that usually do not require medical attention (report to your doctor or health care professional if they continue or are bothersome): -fever -headache -muscle aches and pains -pain, tenderness,  redness, or swelling at the injection site -tiredness This list may not describe all possible side effects. Call your doctor for medical advice about side effects. You may report side effects to FDA at 1-800-FDA-1088. Where should I keep my medicine? The vaccine will be given by a health care professional in a clinic, pharmacy, doctor's office, or other health care setting. You will not be given vaccine doses to store at home. NOTE:  This sheet is a summary. It may not cover all possible information. If you have questions about this medicine, talk to your doctor, pharmacist, or health care provider.    2016, Elsevier/Gold Standard. (2015-07-02 10:07:28)     I personally performed the services described in this documentation, which was scribed in my presence. The recorded information has been reviewed and considered, and addended by me as needed.

## 2015-12-07 DIAGNOSIS — L570 Actinic keratosis: Secondary | ICD-10-CM | POA: Diagnosis not present

## 2015-12-07 DIAGNOSIS — L821 Other seborrheic keratosis: Secondary | ICD-10-CM | POA: Diagnosis not present

## 2015-12-07 LAB — HEMOGLOBIN A1C
Hgb A1c MFr Bld: 5.9 % — ABNORMAL HIGH (ref <5.7)
Mean Plasma Glucose: 123 mg/dL — ABNORMAL HIGH (ref <117)

## 2015-12-09 ENCOUNTER — Telehealth: Payer: Self-pay

## 2015-12-09 NOTE — Telephone Encounter (Signed)
Patient left voicemail today at 1413 stating we were supposed to send his records to [facility] attn Jana Half [place] approximately 2-3 weeks ago. They have not received anything and he is checking status of request. Please call back at [phone] (facility #) or [phone] (cell).

## 2015-12-09 NOTE — Telephone Encounter (Signed)
Records were faxed thru [employer] on 11/10/15. Re-faxed today. Left voicemail for patient on cell #.

## 2015-12-12 NOTE — Telephone Encounter (Signed)
Records printed from [facility] (102 pages) and refaxed manually on Saturday with successful confirmation. Please call patient to notify.

## 2015-12-13 NOTE — Telephone Encounter (Signed)
Patient notified

## 2016-02-22 DIAGNOSIS — H40051 Ocular hypertension, right eye: Secondary | ICD-10-CM | POA: Diagnosis not present

## 2016-02-22 DIAGNOSIS — H47011 Ischemic optic neuropathy, right eye: Secondary | ICD-10-CM | POA: Diagnosis not present

## 2016-02-22 DIAGNOSIS — H40052 Ocular hypertension, left eye: Secondary | ICD-10-CM | POA: Diagnosis not present

## 2016-04-03 ENCOUNTER — Other Ambulatory Visit: Payer: Self-pay | Admitting: Family Medicine

## 2016-06-02 ENCOUNTER — Other Ambulatory Visit: Payer: Self-pay | Admitting: Family Medicine

## 2016-06-05 ENCOUNTER — Ambulatory Visit: Payer: Medicare Other | Admitting: Family Medicine

## 2016-06-06 DIAGNOSIS — D225 Melanocytic nevi of trunk: Secondary | ICD-10-CM | POA: Diagnosis not present

## 2016-06-06 DIAGNOSIS — L814 Other melanin hyperpigmentation: Secondary | ICD-10-CM | POA: Diagnosis not present

## 2016-06-06 DIAGNOSIS — L821 Other seborrheic keratosis: Secondary | ICD-10-CM | POA: Diagnosis not present

## 2016-06-06 DIAGNOSIS — L570 Actinic keratosis: Secondary | ICD-10-CM | POA: Diagnosis not present

## 2016-06-06 DIAGNOSIS — D1801 Hemangioma of skin and subcutaneous tissue: Secondary | ICD-10-CM | POA: Diagnosis not present

## 2016-06-06 DIAGNOSIS — D485 Neoplasm of uncertain behavior of skin: Secondary | ICD-10-CM | POA: Diagnosis not present

## 2016-06-07 ENCOUNTER — Ambulatory Visit: Payer: Medicare Other | Admitting: Family Medicine

## 2016-06-08 ENCOUNTER — Encounter: Payer: Self-pay | Admitting: Family Medicine

## 2016-06-08 ENCOUNTER — Ambulatory Visit (INDEPENDENT_AMBULATORY_CARE_PROVIDER_SITE_OTHER): Payer: Medicare Other | Admitting: Family Medicine

## 2016-06-08 VITALS — BP 130/66 | HR 78 | Temp 98.2°F | Resp 16 | Ht 65.75 in | Wt 163.0 lb

## 2016-06-08 DIAGNOSIS — E785 Hyperlipidemia, unspecified: Secondary | ICD-10-CM | POA: Diagnosis not present

## 2016-06-08 DIAGNOSIS — H9190 Unspecified hearing loss, unspecified ear: Secondary | ICD-10-CM | POA: Diagnosis not present

## 2016-06-08 DIAGNOSIS — R7303 Prediabetes: Secondary | ICD-10-CM

## 2016-06-08 DIAGNOSIS — R7309 Other abnormal glucose: Secondary | ICD-10-CM | POA: Diagnosis not present

## 2016-06-08 DIAGNOSIS — I10 Essential (primary) hypertension: Secondary | ICD-10-CM

## 2016-06-08 LAB — HEMOGLOBIN A1C
Hgb A1c MFr Bld: 6 % — ABNORMAL HIGH (ref <5.7)
Mean Plasma Glucose: 126 mg/dL

## 2016-06-08 MED ORDER — LISINOPRIL 10 MG PO TABS: 10.0000 mg | ORAL_TABLET | Freq: Every day | ORAL | Status: DC

## 2016-06-08 MED ORDER — ATORVASTATIN CALCIUM 10 MG PO TABS: 10.0000 mg | ORAL_TABLET | Freq: Every day | ORAL | Status: DC

## 2016-06-08 NOTE — Patient Instructions (Addendum)
     IF you received an x-ray today, you will receive an invoice from [facility]. Please contact [facility] at [phone] with questions or concerns regarding your invoice.   IF you received labwork today, you will receive an invoice from [employer]. Please contact [facility] at [phone] with questions or concerns regarding your invoice.   Our billing staff will not be able to assist you with questions regarding bills from these companies.  You will be contacted with the lab results as soon as they are available. The fastest way to get your results is to activate your My Chart account. Instructions are located on the last page of this paperwork. If you have not heard from [place] regarding the results in 2 weeks, please contact this office.    No change in medications for now. Follow-up with me in 6 months. I do recommend an evaluation with your audiologist to see if hearing aids may be beneficial at this point. Let me know if I need to help with this. Have a good summer!

## 2016-06-08 NOTE — Progress Notes (Signed)
By signing my name below, I, Mesha Guinyard, attest that this documentation has been prepared under the direction and in the presence of Merri Ray, MD.  Electronically Signed: Verlee Monte, Medical Scribe. 06/08/2016. 9:27 AM.  Subjective:    Patient ID: Nicholas Finley, male    DOB: September 08, 1926, 82 y.o.   MRN: [id number]  HPI Chief Complaint  Patient presents with  . Follow-up    BLOOD PRESSURE  . Medication Refill    atorvastatin, lisinopril    HPI Comments: Nicholas Finley is a 82 y.o. male with a PMHx of HTN, HLD, and preDM who presents to the [facility] for a follow-up. Pt states his hearing is getting worse- he doesn't have any hearing aids. Pt used to use $20 hearing aids for church- pt no longer goes. Pt lives by himself and is okay with doing his daily activities alone. Pt states he will ask for help when he needs assistance.  HTN: Pressure was alittle elevated last visit, but we had discontinued his HCTZ prior. Recommended monitoring out of office to determine medication changes-remained on Lisinopril 10 mg. Pt hasn't been checking bp at home. Pt denies side affects from his medications. Pt has an occasional cough when he smokes a cigar.  HLD: Pt still takes Lipitor. Pt denies any side affects from his medications. Lab Results  Component Value Date   CHOL 155 12/06/2015   HDL 53 12/06/2015   LDLCALC 84 12/06/2015   TRIG 91 12/06/2015   CHOLHDL 2.9 12/06/2015   Lab Results  Component Value Date   ALT 14 12/06/2015   AST 16 12/06/2015   ALKPHOS 50 12/06/2015   BILITOT 1.1 12/06/2015   PreDM: Pt is fasting for blood work. Pt walked around the [place] garden this morning. Pt works on his yard for exercise. Lab Results  Component Value Date   HGBA1C 5.9* 12/06/2015    Patient Active Problem List   Diagnosis Date Noted  . HTN (hypertension) 03/01/2012  . Hyperlipidemia 03/01/2012  . Hearing difficulty 03/01/2012  . Allergic rhinitis  03/01/2012   Past Medical History  Diagnosis Date  . Enlarged prostate   . Hypertension   . Hypercholesteremia   . Allergy   . Cataract   . Heart murmur    Past Surgical History  Procedure Laterality Date  . Skin cancer excision    . Cyst excision      pilonidal   No Known Allergies Prior to Admission medications   Medication Sig Start Date End Date Taking? Authorizing Provider  aspirin 81 MG tablet Take 81 mg by mouth daily.    Historical Provider, MD  ASTEPRO 0.15 % SOLN instill 1 spray into each nostril twice a day if needed Patient not taking: Reported on 12/06/2015 04/21/13   Collene Leyden, PA-C  atorvastatin (LIPITOR) 10 MG tablet take 1 tablet by mouth daily 06/05/16   [name], MD  lisinopril (PRINIVIL,ZESTRIL) 10 MG tablet take 1 tablet by mouth daily 04/05/16   [name], MD   Social History   Social History  . Marital Status: Single    Spouse Name: N/A  . Number of Children: 0  . Years of Education: 16   Occupational History  .      retired   Social History Main Topics  . Smoking status: Current Some Day Smoker    Types: Cigarettes, Pipe, Cigars  . Smokeless tobacco: Never Used     Comment: cigars   . Alcohol  Use: 0.0 oz/week    0 Standard drinks or equivalent per week     Comment: occas beer  . Drug Use: No  . Sexual Activity: Not on file   Other Topics Concern  . Not on file   Social History Narrative   Patient is right handed, resides alone, consumes caffeine twice daily    Review of Systems  Constitutional: Negative for fatigue and unexpected weight change.  HENT: Positive for hearing loss.   Eyes: Negative for visual disturbance.  Respiratory: Positive for cough (occasional due to cigars). Negative for chest tightness and shortness of breath.   Cardiovascular: Negative for chest pain, palpitations and leg swelling.  Gastrointestinal: Negative for abdominal pain and blood in stool.  Neurological: Negative for dizziness,  light-headedness and headaches.    Objective:  BP 130/66 mmHg  Pulse 78  Temp(Src) 98.2 F (36.8 C) (Oral)  Resp 16  Ht 5' 5.75" (1.67 m)  Wt 163 lb (73.936 kg)  BMI 26.51 kg/m2  SpO2 97%  Physical Exam  Constitutional: He is oriented to person, place, and time. He appears well-developed and well-nourished.  HENT:  Head: Normocephalic and atraumatic.  Eyes: EOM are normal. Pupils are equal, round, and reactive to light.  Neck: No JVD present. Carotid bruit is not present.  Cardiovascular: Normal rate, regular rhythm and normal heart sounds.   No murmur heard. Pulmonary/Chest: Effort normal and breath sounds normal. He has no rales.  Musculoskeletal: He exhibits no edema.  Neurological: He is alert and oriented to person, place, and time.  Skin: Skin is warm and dry.  Psychiatric: He has a normal mood and affect.  Vitals reviewed.  Assessment & Plan:   Nicholas Finley is a 82 y.o. male Prediabetes - Plan: Hemoglobin A1c  -Labs pending. Continue to watch diet, maintain activity.  Hyperlipemia - Plan: COMPLETE METABOLIC PANEL WITH GFR, Lipid panel, atorvastatin (LIPITOR) 10 MG tablet  -Tolerating Lipitor, labs pending. No change in medication for now.  Essential hypertension - Plan: COMPLETE METABOLIC PANEL WITH GFR, lisinopril (PRINIVIL,ZESTRIL) 10 MG tablet  -Stable. Tolerating lisinopril. Labs pending. Refilled same dose.  Hearing difficulty, unspecified laterality  -Encouraged audiology your hearing aid evaluation. Advised to let me know if he needs a referral or assistance in doing so.  Meds ordered this encounter  Medications  . atorvastatin (LIPITOR) 10 MG tablet    Sig: Take 1 tablet (10 mg total) by mouth daily.    Dispense:  90 tablet    Refill:  1  . lisinopril (PRINIVIL,ZESTRIL) 10 MG tablet    Sig: Take 1 tablet (10 mg total) by mouth daily.    Dispense:  90 tablet    Refill:  1   Patient Instructions       IF you received an x-ray today, you  will receive an invoice from [facility]. Please contact [facility] at [phone] with questions or concerns regarding your invoice.   IF you received labwork today, you will receive an invoice from [employer]. Please contact [facility] at [phone] with questions or concerns regarding your invoice.   Our billing staff will not be able to assist you with questions regarding bills from these companies.  You will be contacted with the lab results as soon as they are available. The fastest way to get your results is to activate your My Chart account. Instructions are located on the last page of this paperwork. If you have not heard from [place] regarding the results in 2  weeks, please contact this office.    No change in medications for now. Follow-up with me in 6 months. I do recommend an evaluation with your audiologist to see if hearing aids may be beneficial at this point. Let me know if I need to help with this. Have a good summer!    I personally performed the services described in this documentation, which was scribed in my presence. The recorded information has been reviewed and considered, and addended by me as needed.   Signed,   Merri Ray, MD [facility].  06/10/2016 9:45 AM

## 2016-06-09 LAB — COMPLETE METABOLIC PANEL WITH GFR
ALBUMIN: 4 g/dL (ref 3.6–5.1)
ALK PHOS: 54 U/L (ref 40–115)
ALT: 15 U/L (ref 9–46)
AST: 17 U/L (ref 10–35)
BILIRUBIN TOTAL: 1 mg/dL (ref 0.2–1.2)
BUN: 12 mg/dL (ref 7–25)
CALCIUM: 9.3 mg/dL (ref 8.6–10.3)
CO2: 19 mmol/L — ABNORMAL LOW (ref 20–31)
Chloride: 108 mmol/L (ref 98–110)
Creat: 0.72 mg/dL (ref 0.70–1.11)
GFR, EST NON AFRICAN AMERICAN: 83 mL/min (ref >=60)
Glucose, Bld: 108 mg/dL — ABNORMAL HIGH (ref 65–99)
POTASSIUM: 4 mmol/L (ref 3.5–5.3)
Sodium: 140 mmol/L (ref 135–146)
Total Protein: 7 g/dL (ref 6.1–8.1)

## 2016-06-09 LAB — LIPID PANEL
CHOL/HDL RATIO: 2.7 ratio (ref <=5.0)
CHOLESTEROL: 147 mg/dL (ref 125–200)
HDL: 54 mg/dL (ref >=40)
LDL Cholesterol: 70 mg/dL (ref <130)
Triglycerides: 113 mg/dL (ref <150)
VLDL: 23 mg/dL (ref <30)

## 2016-06-20 ENCOUNTER — Telehealth: Payer: Self-pay | Admitting: Emergency Medicine

## 2016-06-20 NOTE — Telephone Encounter (Signed)
Pt returned phone call Lab results given

## 2016-06-20 NOTE — Telephone Encounter (Signed)
Left lab results on VM Will mail lab letter

## 2016-06-20 NOTE — Telephone Encounter (Signed)
-----   Message from [name], MD sent at 06/19/2016  8:24 AM EDT ----- Call patient or lab letter:  Electrolytes overall stable. Blood sugar was slightly elevated, but still at prediabetes level. Continue to watch diet. Cholesterol test was normal. Let me know if you have any questions.

## 2016-08-22 DIAGNOSIS — H40052 Ocular hypertension, left eye: Secondary | ICD-10-CM | POA: Diagnosis not present

## 2016-08-22 DIAGNOSIS — H4720 Unspecified optic atrophy: Secondary | ICD-10-CM | POA: Diagnosis not present

## 2016-08-22 DIAGNOSIS — H40051 Ocular hypertension, right eye: Secondary | ICD-10-CM | POA: Diagnosis not present

## 2016-08-22 DIAGNOSIS — H52203 Unspecified astigmatism, bilateral: Secondary | ICD-10-CM | POA: Diagnosis not present

## 2016-09-25 DIAGNOSIS — Z23 Encounter for immunization: Secondary | ICD-10-CM | POA: Diagnosis not present

## 2016-12-05 DIAGNOSIS — D225 Melanocytic nevi of trunk: Secondary | ICD-10-CM | POA: Diagnosis not present

## 2016-12-05 DIAGNOSIS — L821 Other seborrheic keratosis: Secondary | ICD-10-CM | POA: Diagnosis not present

## 2016-12-05 DIAGNOSIS — L570 Actinic keratosis: Secondary | ICD-10-CM | POA: Diagnosis not present

## 2016-12-07 ENCOUNTER — Ambulatory Visit (INDEPENDENT_AMBULATORY_CARE_PROVIDER_SITE_OTHER): Payer: Medicare Other | Admitting: Family Medicine

## 2016-12-07 ENCOUNTER — Encounter: Payer: Self-pay | Admitting: Family Medicine

## 2016-12-07 VITALS — BP 122/74 | HR 74 | Temp 98.9°F | Resp 16 | Ht 66.0 in | Wt 161.6 lb

## 2016-12-07 DIAGNOSIS — R7303 Prediabetes: Secondary | ICD-10-CM

## 2016-12-07 DIAGNOSIS — I10 Essential (primary) hypertension: Secondary | ICD-10-CM

## 2016-12-07 DIAGNOSIS — E785 Hyperlipidemia, unspecified: Secondary | ICD-10-CM

## 2016-12-07 DIAGNOSIS — J309 Allergic rhinitis, unspecified: Secondary | ICD-10-CM | POA: Diagnosis not present

## 2016-12-07 MED ORDER — LISINOPRIL 10 MG PO TABS: 10.0000 mg | ORAL_TABLET | Freq: Every day | ORAL | 1 refills | Status: DC

## 2016-12-07 MED ORDER — ATORVASTATIN CALCIUM 10 MG PO TABS: 10.0000 mg | ORAL_TABLET | Freq: Every day | ORAL | 1 refills | Status: DC

## 2016-12-07 MED ORDER — AZELASTINE HCL 0.15 % NA SOLN: NASAL | 6 refills | Status: DC

## 2016-12-07 NOTE — Patient Instructions (Addendum)
No change in medications for now. Follow-up with me in 6 months.    IF you received an x-ray today, you will receive an invoice from [facility]. Please contact [facility] at [phone] with questions or concerns regarding your invoice.   IF you received labwork today, you will receive an invoice from [employer]. Please contact [facility] at [phone] with questions or concerns regarding your invoice.   Our billing staff will not be able to assist you with questions regarding bills from these companies.  You will be contacted with the lab results as soon as they are available. The fastest way to get your results is to activate your My Chart account. Instructions are located on the last page of this paperwork. If you have not heard from [place] regarding the results in 2 weeks, please contact this office.

## 2016-12-07 NOTE — Progress Notes (Signed)
By signing my name below I, Tereasa Coop, attest that this documentation has been prepared under the direction and in the presence of [name], MD. Electonically Signed. Tereasa Coop, Scribe 12/07/2016 at 8:51 AM  Subjective:    Patient ID: Nicholas Finley, male    DOB: [birth year], 82 y.o.   MRN: [id number]  Chief Complaint  Patient presents with  . Follow-up    HTN and PREDIABETES  . Medication Refill    ASTEPRO he does not have     HPI Nicholas Finley is a 82 y.o. male who presents to the [facility] for follow up reevaluation of HTN and pre-DM.  Pt reports that he has been walking everyday for exercise. Pt reports that he smokes an occasional cigar. Denies smoking cigarettes.   Pt had a history of transient global amnesia in 2015. Has been doing well since that time. Pt denies any memory issues since that time. No new complaints. Has been feeling well.   HTN Lab Results  Component Value Date   CREATININE 0.72 06/08/2016  Pt takes10mg  lisinopril QD. Pt denies checking his BP at home. Pt denies any SOB, CP, lightheadedness, dizziness, or cough.  HLD Lab Results  Component Value Date   CHOL 147 06/08/2016   HDL 54 06/08/2016   LDLCALC 70 06/08/2016   TRIG 113 06/08/2016   CHOLHDL 2.7 06/08/2016   Lab Results  Component Value Date   ALT 15 06/08/2016   AST 17 06/08/2016   ALKPHOS 54 06/08/2016   BILITOT 1.0 06/08/2016  Pt takes 10mg  Lipitor QD.  Pre-DM Lab Results  Component Value Date   HGBA1C 6.0 (H) 06/08/2016  Blood sugar has remained overall stable from one year ago.  Allergic Rhinitis Denies cough recently. Needs a new prescription for his astepro nasal spray.   Patient Active Problem List   Diagnosis Date Noted  . HTN (hypertension) 03/01/2012  . Hyperlipidemia 03/01/2012  . Hearing difficulty 03/01/2012  . Allergic rhinitis 03/01/2012   Past Medical History:  Diagnosis Date  . Allergy   . Cataract   . Enlarged  prostate   . Heart murmur   . Hypercholesteremia   . Hypertension    Past Surgical History:  Procedure Laterality Date  . CYST EXCISION     pilonidal  . SKIN CANCER EXCISION     Not on File Prior to Admission medications   Medication Sig Start Date End Date Taking? Authorizing Provider  aspirin 81 MG tablet Take 81 mg by mouth daily.    Historical Provider, MD  ASTEPRO 0.15 % SOLN instill 1 spray into each nostril twice a day if needed 04/21/13   Collene Leyden, PA-C  atorvastatin (LIPITOR) 10 MG tablet Take 1 tablet (10 mg total) by mouth daily. 06/08/16   [name], MD  lisinopril (PRINIVIL,ZESTRIL) 10 MG tablet Take 1 tablet (10 mg total) by mouth daily. 06/08/16   [name], MD   Social History   Social History  . Marital status: Single    Spouse name: N/A  . Number of children: 0  . Years of education: 5   Occupational History  .      retired   Social History Main Topics  . Smoking status: Current Some Day Smoker    Types: Cigarettes, Pipe, Cigars  . Smokeless tobacco: Never Used     Comment: cigars   . Alcohol use 0.0 oz/week     Comment: occas beer  . Drug  use: No  . Sexual activity: Not on file   Other Topics Concern  . Not on file   Social History Narrative   Patient is right handed, resides alone, consumes caffeine twice daily      Review of Systems  Constitutional: Negative for fatigue and unexpected weight change.  Eyes: Negative for visual disturbance.  Respiratory: Negative for cough, chest tightness and shortness of breath.   Cardiovascular: Negative for chest pain, palpitations and leg swelling.  Gastrointestinal: Negative for abdominal pain and blood in stool.  Neurological: Negative for dizziness, light-headedness and headaches.       Objective:   Physical Exam  Constitutional: He is oriented to person, place, and time. He appears well-developed and well-nourished.  HENT:  Head: Normocephalic and atraumatic.  Eyes: EOM  are normal. Pupils are equal, round, and reactive to light.  Neck: No JVD present. Carotid bruit is not present.  Cardiovascular: Normal rate and regular rhythm.   Murmur heard.  Systolic murmur is present with a grade of 2/6  Pulmonary/Chest: Effort normal and breath sounds normal. He has no rales.  Musculoskeletal: He exhibits no edema.  Neurological: He is alert and oriented to person, place, and time.  Skin: Skin is warm and dry.  Psychiatric: He has a normal mood and affect.  Vitals reviewed.   Vitals:   12/07/16 0819 12/07/16 0822  BP: (!) 142/80 122/74  Pulse: 74   Resp: 16   Temp: 98.9 F (37.2 C)   TempSrc: Oral   SpO2: 96%   Weight: 161 lb 9.6 oz (73.3 kg)   Height: 5\' 6"  (1.676 m)          Assessment & Plan:   Nicholas Finley is a 82 y.o. male Allergic rhinitis, unspecified chronicity, unspecified seasonality, unspecified trigger - Plan: Azelastine HCl (ASTEPRO) 0.15 % SOLN  - Episodic use of Astepro has been effective previously. Refilled Astepro, RTC precautions  Essential hypertension - Plan: lisinopril (PRINIVIL,ZESTRIL) 10 MG tablet, Comprehensive metabolic panel  - Stable. Tolerating lisinopril at current dose, no med changes, labs pending.  Hyperlipidemia, unspecified hyperlipidemia type - Plan: atorvastatin (LIPITOR) 10 MG tablet, Comprehensive metabolic panel, Lipid panel  - Tolerating Lipitor, discussed risks and benefit of that medication, but with family history of cardiac disease and is tolerating medication, continued Lipitor same dose.  Prediabetes - Plan: Hemoglobin A1C  - Check A1c, continue walking/exercise.  Denies any difficulty with functional status at home. Still lives by self, but has looked into other options for retirement communities. Can discuss further on functional status discussion at next Medicare wellness exam, but denies any recent changes.  Meds ordered this encounter  Medications  . lisinopril (PRINIVIL,ZESTRIL) 10 MG  tablet    Sig: Take 1 tablet (10 mg total) by mouth daily.    Dispense:  90 tablet    Refill:  1  . atorvastatin (LIPITOR) 10 MG tablet    Sig: Take 1 tablet (10 mg total) by mouth daily.    Dispense:  90 tablet    Refill:  1  . Azelastine HCl (ASTEPRO) 0.15 % SOLN    Sig: instill 1 spray into each nostril twice a day if needed    Dispense:  30 mL    Refill:  6   Patient Instructions   No change in medications for now. Follow-up with me in 6 months.    IF you received an x-ray today, you will receive an invoice from [facility]. Please contact [facility] at [phone]  with questions or concerns regarding your invoice.   IF you received labwork today, you will receive an invoice from [employer]. Please contact [facility] at [phone] with questions or concerns regarding your invoice.   Our billing staff will not be able to assist you with questions regarding bills from these companies.  You will be contacted with the lab results as soon as they are available. The fastest way to get your results is to activate your My Chart account. Instructions are located on the last page of this paperwork. If you have not heard from [place] regarding the results in 2 weeks, please contact this office.       I personally performed the services described in this documentation, which was scribed in my presence. The recorded information has been reviewed and considered, and addended by me as needed.   Signed,   Merri Ray, MD [facility].  12/07/16 11:41 AM

## 2016-12-08 LAB — COMPREHENSIVE METABOLIC PANEL
ALBUMIN: 4.2 g/dL (ref 3.5–4.7)
ALK PHOS: 54 IU/L (ref 39–117)
ALT: 19 IU/L (ref 0–44)
AST: 18 IU/L (ref 0–40)
Albumin/Globulin Ratio: 1.6 (ref 1.2–2.2)
BILIRUBIN TOTAL: 0.9 mg/dL (ref 0.0–1.2)
BUN / CREAT RATIO: 25 — AB (ref 10–24)
BUN: 15 mg/dL (ref 8–27)
CHLORIDE: 102 mmol/L (ref 96–106)
CO2: 23 mmol/L (ref 18–29)
CREATININE: 0.61 mg/dL — AB (ref 0.76–1.27)
Calcium: 9.4 mg/dL (ref 8.6–10.2)
GFR calc non Af Amer: 89 mL/min/{1.73_m2} (ref >59)
GFR, EST AFRICAN AMERICAN: 103 mL/min/{1.73_m2} (ref >59)
GLOBULIN, TOTAL: 2.7 g/dL (ref 1.5–4.5)
Glucose: 100 mg/dL — ABNORMAL HIGH (ref 65–99)
Potassium: 4.5 mmol/L (ref 3.5–5.2)
SODIUM: 140 mmol/L (ref 134–144)
TOTAL PROTEIN: 6.9 g/dL (ref 6.0–8.5)

## 2016-12-08 LAB — LIPID PANEL
CHOLESTEROL TOTAL: 150 mg/dL (ref 100–199)
Chol/HDL Ratio: 2.9 ratio units (ref 0.0–5.0)
HDL: 52 mg/dL (ref >39)
LDL CALC: 83 mg/dL (ref 0–99)
Triglycerides: 77 mg/dL (ref 0–149)
VLDL Cholesterol Cal: 15 mg/dL (ref 5–40)

## 2016-12-08 LAB — HEMOGLOBIN A1C
ESTIMATED AVERAGE GLUCOSE: 123 mg/dL
HEMOGLOBIN A1C: 5.9 % — AB (ref 4.8–5.6)

## 2016-12-21 ENCOUNTER — Encounter: Payer: Self-pay | Admitting: *Deleted

## 2017-02-20 DIAGNOSIS — H26491 Other secondary cataract, right eye: Secondary | ICD-10-CM | POA: Diagnosis not present

## 2017-02-20 DIAGNOSIS — H40053 Ocular hypertension, bilateral: Secondary | ICD-10-CM | POA: Diagnosis not present

## 2017-05-09 ENCOUNTER — Other Ambulatory Visit: Payer: Self-pay | Admitting: Family Medicine

## 2017-05-09 DIAGNOSIS — I10 Essential (primary) hypertension: Secondary | ICD-10-CM

## 2017-05-09 DIAGNOSIS — E785 Hyperlipidemia, unspecified: Secondary | ICD-10-CM

## 2017-06-05 DIAGNOSIS — D225 Melanocytic nevi of trunk: Secondary | ICD-10-CM | POA: Diagnosis not present

## 2017-06-05 DIAGNOSIS — L814 Other melanin hyperpigmentation: Secondary | ICD-10-CM | POA: Diagnosis not present

## 2017-06-05 DIAGNOSIS — L570 Actinic keratosis: Secondary | ICD-10-CM | POA: Diagnosis not present

## 2017-08-02 ENCOUNTER — Telehealth: Payer: Self-pay | Admitting: Family Medicine

## 2017-08-02 NOTE — Telephone Encounter (Signed)
[facility] [place] [facility] THAT PT NEED A REFILL ON LISINOPRIL AND ATORVASTATIN PLEASE SEND TO THE [facility] [place] [facility] HE DOES NOT USE [facility] ANY MORE

## 2017-08-07 ENCOUNTER — Encounter: Payer: Self-pay | Admitting: Family Medicine

## 2017-08-07 ENCOUNTER — Ambulatory Visit (INDEPENDENT_AMBULATORY_CARE_PROVIDER_SITE_OTHER): Payer: Medicare Other | Admitting: Family Medicine

## 2017-08-07 DIAGNOSIS — I10 Essential (primary) hypertension: Secondary | ICD-10-CM

## 2017-08-07 DIAGNOSIS — E785 Hyperlipidemia, unspecified: Secondary | ICD-10-CM

## 2017-08-07 MED ORDER — LISINOPRIL 10 MG PO TABS: 10.0000 mg | ORAL_TABLET | Freq: Every day | ORAL | 1 refills | Status: DC

## 2017-08-07 MED ORDER — ATORVASTATIN CALCIUM 10 MG PO TABS: 10.0000 mg | ORAL_TABLET | Freq: Every day | ORAL | 1 refills | Status: DC

## 2017-08-07 NOTE — Progress Notes (Signed)
Subjective:  By signing my name below, I, Essence Howell, attest that this documentation has been prepared under the direction and in the presence of [name], MD Electronically Signed: Ladene Artist, ED Scribe 08/07/2017 at 9:16 AM.   Patient ID: Nicholas Finley, male    DOB: [birth year], 83 y.o.   MRN: [id number]   Chief Complaint  Patient presents with  . Medication Refill    lipitor,lisionpril    HPI Nicholas Finley is a 83 y.o. male who presents to [facility] at [facility] for a medication refill of Lipitor and Lisinopril.   HTN Takes Lisinopril 10 mg qd. Denies light-headedness, dizziness. He does not check his BP outside of the office.  Lab Results  Component Value Date   CREATININE 0.61 (L) 12/07/2016   Hyperlipidemia  Takes Lipitor 10 mg qd and has tolerated this medicine in the past and has decided to remain on it. Denies myalgias, cough, sob, chest pain, abdominal pain, blood in stools.  Lab Results  Component Value Date   ALT 19 12/07/2016   AST 18 12/07/2016   ALKPHOS 54 12/07/2016   BILITOT 0.9 12/07/2016   Lab Results  Component Value Date   CHOL 150 12/07/2016   HDL 52 12/07/2016   LDLCALC 83 12/07/2016   TRIG 77 12/07/2016   CHOLHDL 2.9 12/07/2016   Pre-Diabetes Lab Results  Component Value Date   HGBA1C 5.9 (H) 12/07/2016  Overall his blood sugars have been stable.  Takes Astepro for allergies.   Patient Active Problem List   Diagnosis Date Noted  . HTN (hypertension) 03/01/2012  . Hyperlipidemia 03/01/2012  . Hearing difficulty 03/01/2012  . Allergic rhinitis 03/01/2012   Past Medical History:  Diagnosis Date  . Allergy   . Cataract   . Enlarged prostate   . Heart murmur   . Hypercholesteremia   . Hypertension    Past Surgical History:  Procedure Laterality Date  . CYST EXCISION     pilonidal  . SKIN CANCER EXCISION     Not on File Prior to Admission medications   Medication Sig Start Date End Date Taking? Authorizing  Provider  aspirin 81 MG tablet Take 81 mg by mouth daily.    [provider]  atorvastatin (LIPITOR) 10 MG tablet Take 1 tablet (10 mg total) by mouth daily. 12/07/16   [name], MD  atorvastatin (LIPITOR) 10 MG tablet take 1 tablet by mouth once daily 05/10/17   [name], MD  Azelastine HCl (ASTEPRO) 0.15 % SOLN instill 1 spray into each nostril twice a day if needed 12/07/16   [name], MD  lisinopril (PRINIVIL,ZESTRIL) 10 MG tablet take 1 tablet by mouth once daily 05/10/17   [name], MD   Social History   Social History  . Marital status: Single    Spouse name: N/A  . Number of children: 0  . Years of education: 40   Occupational History  .      retired   Social History Main Topics  . Smoking status: Current Some Day Smoker    Types: Cigarettes, Pipe, Cigars  . Smokeless tobacco: Never Used     Comment: cigars   . Alcohol use 0.0 oz/week     Comment: occas beer  . Drug use: No  . Sexual activity: Not on file   Other Topics Concern  . Not on file   Social History Narrative   Patient is right handed, resides alone, consumes caffeine twice daily  Review of Systems  Constitutional: Negative for fatigue and unexpected weight change.  Eyes: Negative for visual disturbance.  Respiratory: Negative for cough, chest tightness and shortness of breath.   Cardiovascular: Negative for chest pain, palpitations and leg swelling.  Gastrointestinal: Negative for abdominal pain and blood in stool.  Musculoskeletal: Negative for myalgias.  Neurological: Negative for dizziness, light-headedness and headaches.      Objective:   Physical Exam  Constitutional: He is oriented to person, place, and time. He appears well-developed and well-nourished.  HENT:  Head: Normocephalic and atraumatic.  Eyes: Pupils are equal, round, and reactive to light. EOM are normal.  Neck: No JVD present. Carotid bruit is not present.  Cardiovascular: Normal rate  and regular rhythm.   Murmur (2-3/5 systolic) heard. Pulmonary/Chest: Effort normal and breath sounds normal. He has no rales.  Musculoskeletal: He exhibits no edema.  Neurological: He is alert and oriented to person, place, and time.  Skin: Skin is warm and dry.  Psychiatric: He has a normal mood and affect.  Vitals reviewed.  Vitals:   08/07/17 0904 08/07/17 0926  BP: (!) 145/86 (!) 146/76  Pulse: 68   Resp: 18   Temp: 97.9 F (36.6 C)   TempSrc: Oral   SpO2: 98%   Weight: 162 lb (73.5 kg)   Height: 5' 6.5" (1.689 m)       Assessment & Plan:   Nicholas Finley is a 83 y.o. male Hyperlipidemia, unspecified hyperlipidemia type - Plan: atorvastatin (LIPITOR) 10 MG tablet, Comprehensive metabolic panel, Lipid panel  - Has tolerated Lipitor, have discussed in past whether or not to remain on Lipitor at age 36, but as he is tolerating the medication, we'll continue same dose. Check lipid panel, CMP   Essential hypertension - Plan: lisinopril (PRINIVIL,ZESTRIL) 10 MG tablet, Comprehensive metabolic panel  - Elevated systolic reading. Suspect some component of systolic hypertension based on his age. No med changes today, but if remains elevated outside of office would consider higher dosing.  Meds ordered this encounter  Medications  . atorvastatin (LIPITOR) 10 MG tablet    Sig: Take 1 tablet (10 mg total) by mouth daily.    Dispense:  90 tablet    Refill:  1  . lisinopril (PRINIVIL,ZESTRIL) 10 MG tablet    Sig: Take 1 tablet (10 mg total) by mouth daily.    Dispense:  90 tablet    Refill:  1   Patient Instructions   Same medication doses for now. Check your blood pressure outside of the office and if it remains over 140/90, let me know as we may need to change medication. Follow-up with me in 6 months.    IF you received an x-ray today, you will receive an invoice from [facility]. Please contact [facility] at [phone] with questions or concerns  regarding your invoice.   IF you received labwork today, you will receive an invoice from [place]. Please contact [facility] at [phone] with questions or concerns regarding your invoice.   Our billing staff will not be able to assist you with questions regarding bills from these companies.  You will be contacted with the lab results as soon as they are available. The fastest way to get your results is to activate your My Chart account. Instructions are located on the last page of this paperwork. If you have not heard from [place] regarding the results in 2 weeks, please contact this office.       I personally performed the services described  in this documentation, which was scribed in my presence. The recorded information has been reviewed and considered for accuracy and completeness, addended by me as needed, and agree with information above.  Signed,   Merri Ray, MD [facility].  08/07/17 9:29 AM

## 2017-08-07 NOTE — Patient Instructions (Addendum)
Same medication doses for now. Check your blood pressure outside of the office and if it remains over 140/90, let me know as we may need to change medication. Follow-up with me in 6 months.    IF you received an x-ray today, you will receive an invoice from [facility]. Please contact [facility] at [phone] with questions or concerns regarding your invoice.   IF you received labwork today, you will receive an invoice from [facility]. Please contact [facility] at [phone] with questions or concerns regarding your invoice.   Our billing staff will not be able to assist you with questions regarding bills from these companies.  You will be contacted with the lab results as soon as they are available. The fastest way to get your results is to activate your My Chart account. Instructions are located on the last page of this paperwork. If you have not heard from [place] regarding the results in 2 weeks, please contact this office.

## 2017-08-08 LAB — COMPREHENSIVE METABOLIC PANEL
A/G RATIO: 1.6 (ref 1.2–2.2)
ALT: 20 IU/L (ref 0–44)
AST: 19 IU/L (ref 0–40)
Albumin: 4.4 g/dL (ref 3.5–4.7)
Alkaline Phosphatase: 61 IU/L (ref 39–117)
BUN / CREAT RATIO: 19 (ref 10–24)
BUN: 13 mg/dL (ref 8–27)
Bilirubin Total: 1 mg/dL (ref 0.0–1.2)
CALCIUM: 9.6 mg/dL (ref 8.6–10.2)
CO2: 21 mmol/L (ref 20–29)
Chloride: 103 mmol/L (ref 96–106)
Creatinine, Ser: 0.69 mg/dL — ABNORMAL LOW (ref 0.76–1.27)
GFR, EST AFRICAN AMERICAN: 97 mL/min/{1.73_m2} (ref >59)
GFR, EST NON AFRICAN AMERICAN: 84 mL/min/{1.73_m2} (ref >59)
GLOBULIN, TOTAL: 2.7 g/dL (ref 1.5–4.5)
Glucose: 100 mg/dL — ABNORMAL HIGH (ref 65–99)
POTASSIUM: 4.2 mmol/L (ref 3.5–5.2)
SODIUM: 143 mmol/L (ref 134–144)
TOTAL PROTEIN: 7.1 g/dL (ref 6.0–8.5)

## 2017-08-08 LAB — LIPID PANEL
CHOL/HDL RATIO: 3 ratio (ref 0.0–5.0)
Cholesterol, Total: 162 mg/dL (ref 100–199)
HDL: 54 mg/dL (ref >39)
LDL Calculated: 92 mg/dL (ref 0–99)
Triglycerides: 80 mg/dL (ref 0–149)
VLDL Cholesterol Cal: 16 mg/dL (ref 5–40)

## 2017-08-20 ENCOUNTER — Encounter: Payer: Self-pay | Admitting: *Deleted

## 2017-08-21 DIAGNOSIS — H353132 Nonexudative age-related macular degeneration, bilateral, intermediate dry stage: Secondary | ICD-10-CM | POA: Diagnosis not present

## 2017-08-21 DIAGNOSIS — H26491 Other secondary cataract, right eye: Secondary | ICD-10-CM | POA: Diagnosis not present

## 2017-08-21 DIAGNOSIS — H40053 Ocular hypertension, bilateral: Secondary | ICD-10-CM | POA: Diagnosis not present

## 2017-08-21 DIAGNOSIS — H52203 Unspecified astigmatism, bilateral: Secondary | ICD-10-CM | POA: Diagnosis not present

## 2017-10-11 DIAGNOSIS — H26491 Other secondary cataract, right eye: Secondary | ICD-10-CM | POA: Diagnosis not present

## 2017-11-06 DIAGNOSIS — Z23 Encounter for immunization: Secondary | ICD-10-CM | POA: Diagnosis not present

## 2018-02-14 ENCOUNTER — Other Ambulatory Visit: Payer: Self-pay | Admitting: Family Medicine

## 2018-02-14 DIAGNOSIS — I10 Essential (primary) hypertension: Secondary | ICD-10-CM

## 2018-02-14 DIAGNOSIS — E785 Hyperlipidemia, unspecified: Secondary | ICD-10-CM

## 2018-04-24 DIAGNOSIS — H40053 Ocular hypertension, bilateral: Secondary | ICD-10-CM | POA: Diagnosis not present

## 2018-05-12 ENCOUNTER — Other Ambulatory Visit: Payer: Self-pay | Admitting: Family Medicine

## 2018-05-12 DIAGNOSIS — E785 Hyperlipidemia, unspecified: Secondary | ICD-10-CM

## 2018-05-12 DIAGNOSIS — I10 Essential (primary) hypertension: Secondary | ICD-10-CM

## 2018-05-16 DIAGNOSIS — L570 Actinic keratosis: Secondary | ICD-10-CM | POA: Diagnosis not present

## 2018-05-16 DIAGNOSIS — L814 Other melanin hyperpigmentation: Secondary | ICD-10-CM | POA: Diagnosis not present

## 2018-05-16 DIAGNOSIS — D225 Melanocytic nevi of trunk: Secondary | ICD-10-CM | POA: Diagnosis not present

## 2018-05-16 DIAGNOSIS — L821 Other seborrheic keratosis: Secondary | ICD-10-CM | POA: Diagnosis not present

## 2018-06-08 ENCOUNTER — Other Ambulatory Visit: Payer: Self-pay | Admitting: Family Medicine

## 2018-06-08 DIAGNOSIS — I10 Essential (primary) hypertension: Secondary | ICD-10-CM

## 2018-06-08 DIAGNOSIS — E785 Hyperlipidemia, unspecified: Secondary | ICD-10-CM

## 2018-06-10 NOTE — Telephone Encounter (Signed)
Atorvastatin (lipitor) refill Last Refill:05/13/18  # 30 No RF Last OV: 08/07/17 PCP: Merri Ray MD Pharmacy:[facility] [address] Last lipid and liver panel: 08/07/17   refill Last Refill:lisinopril # 05/13/17 #30 tab No RF Last OV: 08/07/17   Called pt and left message to make an office visit. Routing back to provider.

## 2018-06-13 ENCOUNTER — Other Ambulatory Visit: Payer: Self-pay

## 2018-06-13 ENCOUNTER — Ambulatory Visit (INDEPENDENT_AMBULATORY_CARE_PROVIDER_SITE_OTHER): Payer: Medicare Other | Admitting: Family Medicine

## 2018-06-13 ENCOUNTER — Encounter: Payer: Self-pay | Admitting: Family Medicine

## 2018-06-13 ENCOUNTER — Ambulatory Visit (INDEPENDENT_AMBULATORY_CARE_PROVIDER_SITE_OTHER): Payer: Medicare Other

## 2018-06-13 VITALS — BP 156/86 | HR 95 | Temp 98.3°F | Ht 66.5 in | Wt 159.4 lb

## 2018-06-13 DIAGNOSIS — Y92009 Unspecified place in unspecified non-institutional (private) residence as the place of occurrence of the external cause: Secondary | ICD-10-CM

## 2018-06-13 DIAGNOSIS — W19XXXA Unspecified fall, initial encounter: Secondary | ICD-10-CM

## 2018-06-13 DIAGNOSIS — R7303 Prediabetes: Secondary | ICD-10-CM

## 2018-06-13 DIAGNOSIS — S52514A Nondisplaced fracture of right radial styloid process, initial encounter for closed fracture: Secondary | ICD-10-CM | POA: Diagnosis not present

## 2018-06-13 DIAGNOSIS — M79641 Pain in right hand: Secondary | ICD-10-CM

## 2018-06-13 DIAGNOSIS — I10 Essential (primary) hypertension: Secondary | ICD-10-CM | POA: Diagnosis not present

## 2018-06-13 DIAGNOSIS — E785 Hyperlipidemia, unspecified: Secondary | ICD-10-CM

## 2018-06-13 DIAGNOSIS — M25531 Pain in right wrist: Secondary | ICD-10-CM | POA: Diagnosis not present

## 2018-06-13 DIAGNOSIS — S40811A Abrasion of right upper arm, initial encounter: Secondary | ICD-10-CM | POA: Diagnosis not present

## 2018-06-13 DIAGNOSIS — S0100XA Unspecified open wound of scalp, initial encounter: Secondary | ICD-10-CM | POA: Diagnosis not present

## 2018-06-13 DIAGNOSIS — S52501A Unspecified fracture of the lower end of right radius, initial encounter for closed fracture: Secondary | ICD-10-CM | POA: Diagnosis not present

## 2018-06-13 MED ORDER — ATORVASTATIN CALCIUM 10 MG PO TABS: 10.0000 mg | ORAL_TABLET | Freq: Every day | ORAL | 1 refills | Status: DC

## 2018-06-13 MED ORDER — LISINOPRIL 10 MG PO TABS: 10.0000 mg | ORAL_TABLET | Freq: Every day | ORAL | 1 refills | Status: DC

## 2018-06-13 NOTE — Progress Notes (Signed)
Subjective:  By signing my name below, I, Moises Blood, attest that this documentation has been prepared under the direction and in the presence of Merri Ray, MD. Electronically Signed: Moises Blood, [place]. 06/13/2018 , 4:07 PM .  Patient was seen in Room 12 .   Patient ID: Nicholas Finley, male    DOB: [birth year], 84 y.o.   MRN: [id number] Chief Complaint  Patient presents with  . Chronic Condition    6 month follow up with med refill of all meds  . Fall    pt fell today and hit head and cut head and right arm. His is having right wrist pain now due to fall   HPI Nicholas Finley is a 84 y.o. male  Patient was initially scheduled for medication refill but has acutely fallen with wounds to head and right arm and right wrist pain. He's fasting today.   Fall He reports falling around 2:45 PM today. He was walking down steps, and it was wet. He slipped going down, and hit the back of his head, as well as landing on his right forearm and right hand. He has pain in his right wrist when he's writing or turning the keys for ignition. He denies LOC. He is right hand dominant. He noticed slight swelling over his right wrist. He denies lightheadedness, dizziness, nausea, vomiting, blurry vision or weakness. He denies usually having lightheadedness or dizziness.   Hyperlipidemia Lab Results  Component Value Date   CHOL 162 08/07/2017   HDL 54 08/07/2017   LDLCALC 92 08/07/2017   TRIG 80 08/07/2017   CHOLHDL 3.0 08/07/2017   Lab Results  Component Value Date   ALT 20 08/07/2017   AST 19 08/07/2017   ALKPHOS 61 08/07/2017   BILITOT 1.0 08/07/2017   He takes Lipitor 10 mg qd. He denies any new muscle aches.   HTN BP Readings from Last 3 Encounters:  06/13/18 (!) 156/86  08/07/17 (!) 146/76  12/07/16 122/74   Lab Results  Component Value Date   CREATININE 0.69 (L) 08/07/2017   He denies missing any doses. He denies lisinopril 10 mg qd. He has noticed some dizziness  when he climbs a ladder to change a light bulb.   Pre-diabetes Lab Results  Component Value Date   HGBA1C 5.9 (H) 12/07/2016   Wt Readings from Last 3 Encounters:  06/13/18 159 lb 6.4 oz (72.3 kg)  08/07/17 162 lb (73.5 kg)  12/07/16 161 lb 9.6 oz (73.3 kg)    Patient Active Problem List   Diagnosis Date Noted  . HTN (hypertension) 03/01/2012  . Hyperlipidemia 03/01/2012  . Hearing difficulty 03/01/2012  . Allergic rhinitis 03/01/2012   Past Medical History:  Diagnosis Date  . Allergy   . Cataract   . Enlarged prostate   . Heart murmur   . Hypercholesteremia   . Hypertension    Past Surgical History:  Procedure Laterality Date  . CYST EXCISION     pilonidal  . SKIN CANCER EXCISION     No Known Allergies Prior to Admission medications   Medication Sig Start Date End Date Taking? Authorizing Provider  aspirin 81 MG tablet Take 81 mg by mouth daily.    [provider]  atorvastatin (LIPITOR) 10 MG tablet TAKE ONE TABLET BY MOUTH DAILY 05/13/18   [name], MD  Azelastine HCl (ASTEPRO) 0.15 % SOLN instill 1 spray into each nostril twice a day if needed 12/07/16   [name], MD  lisinopril (PRINIVIL,ZESTRIL)  10 MG tablet TAKE ONE TABLET BY MOUTH DAILY 05/13/18   [name], MD   Social History   Socioeconomic History  . Marital status: Single    Spouse name: Not on file  . Number of children: 0  . Years of education: 21  . Highest education level: Not on file  Occupational History    Comment: retired  Scientific laboratory technician  . Financial resource strain: Not on file  . Food insecurity:    Worry: Not on file    Inability: Not on file  . Transportation needs:    Medical: Not on file    Non-medical: Not on file  Tobacco Use  . Smoking status: Current Some Day Smoker    Types: Cigarettes, Pipe, Cigars  . Smokeless tobacco: Never Used  . Tobacco comment: cigars   Substance and Sexual Activity  . Alcohol use: Yes    Alcohol/week: 0.0 oz     Comment: occas beer  . Drug use: No  . Sexual activity: Not on file  Lifestyle  . Physical activity:    Days per week: Not on file    Minutes per session: Not on file  . Stress: Not on file  Relationships  . Social connections:    Talks on phone: Not on file    Gets together: Not on file    Attends religious service: Not on file    Active member of club or organization: Not on file    Attends meetings of clubs or organizations: Not on file    Relationship status: Not on file  . Intimate partner violence:    Fear of current or ex partner: Not on file    Emotionally abused: Not on file    Physically abused: Not on file    Forced sexual activity: Not on file  Other Topics Concern  . Not on file  Social History Narrative   Patient is right handed, resides alone, consumes caffeine twice daily   Review of Systems  Constitutional: Negative for fatigue and unexpected weight change.  Eyes: Negative for visual disturbance.  Respiratory: Negative for cough, chest tightness and shortness of breath.   Cardiovascular: Negative for chest pain, palpitations and leg swelling.  Gastrointestinal: Negative for abdominal pain and blood in stool.  Musculoskeletal: Positive for arthralgias and joint swelling.  Skin: Positive for wound.  Neurological: Negative for dizziness, light-headedness and headaches.       Objective:   Physical Exam  Constitutional: He is oriented to person, place, and time. He appears well-developed and well-nourished.  HENT:  Head: Normocephalic and atraumatic.  Eyes: Pupils are equal, round, and reactive to light. EOM are normal.  Neck: No JVD present. Carotid bruit is not present.  Cardiovascular: Normal rate, regular rhythm and normal heart sounds.  No murmur heard. Pulmonary/Chest: Effort normal and breath sounds normal. He has no rales.  Musculoskeletal: He exhibits no edema.  Right shoulder: pain free ROM Right elbow: pain free ROM, no bony tenderness of right  upper arm or elbow Right wrist: significant soft tissue swelling into radial dorsal wrist, although doesn't complain of any pain over the distal radius, radius or scaphoid; has pain with wrist extension, slight discomfort to the wrist with radial and ulnar deviation, pain into the proximal dorsal hand with grip testing  Neurological: He is alert and oriented to person, place, and time.  Skin: Skin is warm and dry. Abrasion and laceration noted.  Right posterior scalp: small superficial skin tear approximately 2-3 mm  with an approximately 4-5 mm superficial laceration, minimal widening with tension, overall hemastatic unless pulled; no focal bony tenderness of the scalp Right forearm: appears to be an abrasion over right forearm without any deep involvement or significant widening with tension; majority of wound without significant widening, slightly deeper abrasion at the distal 3rd but doesn't significantly widen with tension  Psychiatric: He has a normal mood and affect.  Vitals reviewed.   Vitals:   06/13/18 1527 06/13/18 1533  BP: (!) 165/97 (!) 156/86  Pulse: 95   Temp: 98.3 F (36.8 C)   TempSrc: Oral   SpO2: 95%   Weight: 159 lb 6.4 oz (72.3 kg)   Height: 5' 6.5" (1.689 m)    Dg Wrist Complete Right  Result Date: 06/13/2018 CLINICAL DATA:  RIGHT wrist pain post fall, follow-up EXAM: RIGHT WRIST - COMPLETE 3+ VIEW COMPARISON:  None; no prior studies available for comparison FINDINGS: Osseous demineralization. Joint spaces preserved. Intra-articular distal RIGHT radial metaphyseal fracture extending into radiocarpal joint. Fracture margins appear slightly indistinct indicating subacute injury. Distal ulna appears intact. No additional fracture, dislocation or bone destruction. IMPRESSION: Subacute nondisplaced intra-articular fracture of the distal RIGHT radial metaphysis. Electronically Signed   By: Lavonia Dana M.D.   On: 06/13/2018 16:48   Dg Hand Complete Right  Result Date:  06/13/2018 CLINICAL DATA:  RIGHT wrist pain post fall, follow-up EXAM: RIGHT HAND - COMPLETE 3+ VIEW COMPARISON:  None FINDINGS: Osseous demineralization. Intra-articular distal RIGHT radial metaphyseal fracture, better visualized on wrist radiographs. Degenerative changes with joint space narrowing and spur formation at the DIP joints of the index through little fingers, pattern suggesting erosive osteoarthritis. Minimal narrowing of PIP joints. Unusual appearance to the base of the third metacarpal though a definite acute fracture plane is not visualized. No additional fracture, dislocation or bone destruction. IMPRESSION: Nondisplaced subacute intra-articular fracture at distal RIGHT radial metaphysis. No other definite acute bony abnormalities. 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 Electronically Signed   By: Lavonia Dana M.D.   On: 06/13/2018 16:51   [5:08 PM] mupirocin ointment followed by steri-strip placed to abrasions on forearm and posterior scalp; call placed to orthopedist on call for radius fracture [5:14 PM] spoke with ortho, plan for eval in 4 days in office, placed in sugar tong splint until that time [5:41 PM] sugar tong splint applied without difficulty, NVI intact before and afterwards with good cap refill at fingers.      Assessment & Plan:    Nicholas Finley is a 84 y.o. male Fall in home, initial encounter Open wound of scalp, unspecified open wound type, initial encounter Abrasion of right upper extremity, initial encounter Right wrist pain - Plan: DG Wrist Complete Right Hand pain, right - Plan: DG Hand Complete Right Closed fracture of distal end of right radius, unspecified fracture morphology, initial encounter - Plan: Ambulatory referral to Orthopedic Surgery, Apply other splint, Sling immobilizer  -Mechanical fall at home, denies near syncope/syncope.  Fairly superficial abrasions over scalp and forearm, Steri-Strip applied to  both after cleansing and mupirocin ointment.  Symptomatic care/wound care discussed along with RTC/ER precautions  -Unfortunately does have a distal radius fracture with articular involvement.  Placed in sugar tong splint, neurovascular intact distally before and after splint placement with cast/splint care given on handout.  Sling applied and referred to orthopedics.  Discussed with orthopedist on call, and will plan for in office follow-up in 4 days.  Patient declined any pain medication at  this time, can start with over-the-counter Tylenol as needed and if stronger medicine needed can call.  Prediabetes - Plan: Hemoglobin A1c  -Check A1c.  Essential hypertension - Plan: Comprehensive metabolic panel, lisinopril (PRINIVIL,ZESTRIL) 10 MG tablet  -Stable, denies new side effects.  Mildly elevated blood pressure in office may be related to pain from injury.  No change in medications for now but can continue to monitor levels with recheck in the next few weeks.   Hyperlipidemia, unspecified hyperlipidemia type - Plan: Comprehensive metabolic panel, Lipid panel, atorvastatin (LIPITOR) 10 MG tablet  -Tolerating Lipitor, without new side effects. labs obtained, no change in dose at this time.  Meds ordered this encounter  Medications  . lisinopril (PRINIVIL,ZESTRIL) 10 MG tablet    Sig: Take 1 tablet (10 mg total) by mouth daily.    Dispense:  90 tablet    Refill:  1  . atorvastatin (LIPITOR) 10 MG tablet    Sig: Take 1 tablet (10 mg total) by mouth daily.    Dispense:  90 tablet    Refill:  1   Patient Instructions   Blood pressure is running high in the office today.  Please return in next few weeks for recheck. No med changes for now. I will check blood tests.   Avoid working on ladders due to risk of falling.   Keep abrasions clean with soap and water and see information below. Allow the steri strips to fall off on their own. If any headache, dizziness, or worsening symptoms - go to the  emergency room.   Unfortunately there is a break in your bone of the wrist.  I will have you follow that up with orthopedics, but keep splint in place for now.  Tylenol if needed for pain, please let me know if a stronger pain medication is needed.  s Cast or Splint Care, Adult Casts and splints are supports that are worn to protect broken bones and other injuries. A cast or splint may hold a bone still and in the correct position while it heals. Casts and splints may also help ease pain, swelling, and muscle spasms. A cast is a hardened support that is usually made of fiberglass or plaster. It is custom-fit to the body and it offers more protection than a splint. It cannot be taken off and put back on. A splint is a type of soft support that is usually made from cloth and elastic. It can be adjusted or taken off as needed. You may need a cast or a splint if you:  Have a broken bone.  Have a soft-tissue injury.  Need to keep an injured body part from moving (keep it immobile) after surgery.  How is this treated? If you have a cast:  Do not stick anything inside the cast to scratch your skin. Sticking something in the cast increases your risk of infection.  Check the skin around the cast every day. Tell your health care provider about any concerns.  You may put lotion on dry skin around the edges of the cast. Do not put lotion on the skin underneath the cast.  Keep the cast clean.  If the cast is not waterproof: ? Do not let it get wet. ? Cover it with a watertight covering when you take a bath or a shower. If you have a splint:  Wear it as told by your health care provider. Remove it only as told by your health care provider.  Loosen the splint if your  fingers or toes tingle, become numb, or turn cold and blue.  Keep the splint clean.  If the splint is not waterproof: ? Do not let it get wet. ? Cover it with a watertight covering when you take a bath or a  shower. Bathing  Do not take baths or swim until your health care provider approves. Ask your health care provider if you can take showers. You may only be allowed to take sponge baths for bathing.  If your cast or splint is not waterproof, cover it with a watertight covering when you take a bath or shower. Managing pain, stiffness, and swelling  Move your fingers or toes often to avoid stiffness and to lessen swelling.  Raise (elevate) the injured area above the level of your heart while sitting or lying down. Safety  Do not use the injured limb to support your body weight until your health care provider says that it is okay.  Use crutches or other assistive devices as told by your health care provider. General instructions  Do not put pressure on any part of the cast or splint until it is fully hardened. This may take several hours.  Return to your normal activities as told by your health care provider. Ask your health care provider what activities are safe for you.  Take over-the-counter and prescription medicines only as told by your health care provider.  Keep all follow-up visits as told by your health care provider. This is important. Contact a health care provider if:  Your cast or splint gets damaged.  The skin around the cast gets red or raw.  The skin under the cast is extremely itchy or painful.  Your cast or splint feels very uncomfortable.  Your cast or splint is too tight or too loose.  Your cast becomes wet or it develops a soft spot or area.  You get an object stuck under your cast. Get help right away if:  Your pain is getting worse.  The injured area tingles, becomes numb, or turns cold and blue.  The part of your body above or below the cast is swollen and discolored.  You cannot feel or move your fingers or toes.  There is fluid leaking through the cast.  You have severe pain or pressure under the cast.  You have trouble breathing.  You  have shortness of breath.  You have chest pain. This information is not intended to replace advice given to you by your health care provider. Make sure you discuss any questions you have with your health care provider. Document Released: 12/08/2000 Document Revised: 07/01/2016 Document Reviewed: 06/03/2016 Elsevier Interactive Patient Education  2018 [place] An abrasion is a cut or scrape on the outer surface of your skin. An abrasion does not extend through all of the layers of your skin. It is important to care for your abrasion properly to prevent infection. What are the causes? Most abrasions are caused by falling on or gliding across the ground or another surface. When your skin rubs on something, the outer and inner layer of skin rubs off. What are the signs or symptoms? A cut or scrape is the main symptom of this condition. The scrape may be bleeding, or it may appear red or pink. If there was an associated fall, there may be an underlying bruise. How is this diagnosed? An abrasion is diagnosed with a physical exam. How is this treated? Treatment for this condition depends on how large and  deep the abrasion is. Usually, your abrasion will be cleaned with water and mild soap. This removes any dirt or debris that may be stuck. An antibiotic ointment may be applied to the abrasion to help prevent infection. A bandage (dressing) may be placed on the abrasion to keep it clean. You may also need a tetanus shot. Follow these instructions at home: Medicines  Take or apply medicines only as directed by your health care provider.  If you were prescribed an antibiotic ointment, finish all of it even if you start to feel better. Wound care  Clean the wound with mild soap and water 2-3 times per day or as directed by your health care provider. Pat your wound dry with a clean towel. Do not rub it.  There are many different ways to close and cover a wound. Follow instructions  from your health care provider about: ? Wound care. ? Dressing changes and removal.  Check your wound every day for signs of infection. Watch for: ? Redness, swelling, or pain. ? Fluid, blood, or pus. General instructions   Keep the dressing dry as directed by your health care provider. Do not take baths, swim, use a hot tub, or do anything that would put your wound underwater until your health care provider approves.  If there is swelling, raise (elevate) the injured area above the level of your heart while you are sitting or lying down.  Keep all follow-up visits as directed by your health care provider. This is important. Contact a health care provider if:  You received a tetanus shot and you have swelling, severe pain, redness, or bleeding at the injection site.  Your pain is not controlled with medicine.  You have increased redness, swelling, or pain at the site of your wound. Get help right away if:  You have a red streak going away from your wound.  You have a fever.  You have fluid, blood, or pus coming from your wound.  You notice a bad smell coming from your wound or your dressing. This information is not intended to replace advice given to you by your health care provider. Make sure you discuss any questions you have with your health care provider. Document Released: 09/20/2005 Document Revised: 08/11/2016 Document Reviewed: 12/09/2014 Elsevier Interactive Patient Education  2017 [place] Injury, Adult There are many types of head injuries. Head injuries can be as minor as a bump, or they can be more severe. More severe head injuries include:  A jarring injury to the brain (concussion).  A bruise of the brain (contusion). This means there is bleeding in the brain that can cause swelling.  A cracked skull (skull fracture).  Bleeding in the brain that collects, clots, and forms a bump (hematoma).  After a head injury, you may need to be observed  for a while in the emergency department or urgent care. Sometimes admission to the hospital is needed. After a head injury has happened, most problems occur within the first 24 hours, but side effects may occur up to 7-10 days after the injury. It is important to watch your condition for any changes. What are the causes? There are many possible causes of a head injury. A serious head injury may happen to someone who is in a car accident (motor vehicle collision). Other causes of major head injuries include bicycle or motorcycle accidents, sports injuries, and falls. Risk factors This condition is more likely to occur in people who:  Drink a  lot of alcohol or use drugs.  Are over the age of 69.  Are at risk for falls.  What are the symptoms? There are many possible symptoms of a head injury. Visible symptoms of a head injury include a bruise, bump, or bleeding at the site of the injury. Other non-visible symptoms include:  Feeling sleepy or not being able to stay awake.  Passing out.  Headache.  Seizures.  Dizziness.  Confusion.  Memory problems.  Nausea or vomiting.  Other possible symptoms that may develop after the head injury include:  Poor attention and concentration.  Fatigue or tiring easily.  Irritability.  Being uncomfortable around bright lights or loud noises.  Anxiety or depression.  Disturbed sleep.  How is this diagnosed? This condition can usually be diagnosed based on your symptoms, a description of the injury, and a physical exam. You may also have imaging tests done, such as a CT scan or MRI. You will also be closely watched. How is this treated? Treatment for this condition depends on the severity and type of injury you have. The main goal of treatment is to prevent complications and allow the brain time to heal. For mild head injury, you may be sent home and treatment may include:  Observation. A responsible adult should stay with you for 24  hours after your injury and check on you often.  Physical rest.  Brain rest.  Pain medicines.  For severe brain injury, treatment may include:  Close observation. This includes hospitalization with frequent physical exams. You may need to go to a hospital that specializes in head injury.  Pain medicines.  Breathing support. This may include using a ventilator.  Managing the pressure inside the brain (intracranial pressure, or ICP). This may include: ? Monitoring the ICP. ? Giving medicines to decrease the ICP. ? Positioning you to decrease the ICP.  Medicine to prevent seizures.  Surgery to stop bleeding or to remove blood clots (craniotomy).  Surgery to remove part of the skull (decompressive craniectomy). This allows room for the brain to swell.  Follow these instructions at home: Activity  Rest as much as possible and avoid activities that are physically hard or tiring.  Make sure you get enough sleep.  Limit activities that require a lot of thought or attention, such as: ? Watching TV. ? Playing memory games and puzzles. ? Job-related work or homework. ? Working on [employer], and texting.  Avoid activities that could cause another head injury, such as playing sports, until your health care provider approves. Having another head injury, especially before the first one has healed, can be dangerous.  Ask your health care provider when it is safe for you to return to your regular activities, including work or school. Ask your health care provider for a step-by-step plan for gradually returning to activities.  Ask your health care provider when you can drive, ride a bicycle, or use heavy machinery. Your ability to react may be slower after a brain injury. Never do these activities if you are dizzy.  Lifestyle  Do not drink alcohol until your health care provider approves, and avoid drug use. Alcohol and certain drugs may slow your recovery and can put  you at risk of further injury.  If it is harder than usual to remember things, write them down.  If you are easily distracted, try to do one thing at a time.  Talk with family members or close friends when making important decisions.  Tell your  friends, family, a trusted colleague, and work Fre[employer] about your injury, symptoms, and restrictions. Have them watch for any new or worsening problems.  General instructions  Take over-the-counter and prescription medicines only as told by your health care provider.  Have someone stay with you for 24 hours after your head injury. This person should watch you for any changes in your symptoms and be ready to seek medical help, as needed.  Keep all follow-up visits as told by your health care provider. This is important.  Prevention  Work on improving your balance and strength to avoid falls.  Wear a seatbelt when you are in a moving vehicle.  Wear a helmet when riding a bicycle, skiing, or doing any other sport or activity that has a risk of injury.  Drink alcohol only in moderation.  Take safety measures in your home, such as: ? Removing clutter and tripping hazards from floors and stairways. ? Using grab bars in bathrooms and handrails by stairs. ? Placing non-slip mats on floors and in bathtubs. ? Improving lighting in dim areas. Get help right away if:  You have: ? A severe headache that is not helped by medicine. ? Trouble walking, have weakness in your arms and legs, or lose your balance. ? Clear or bloody fluid coming from your nose or ears. ? Changes in your vision. ? A seizure.  You vomit.  Your symptoms get worse.  Your speech is slurred.  You pass out.  You are sleepier and have trouble staying awake.  Your pupils change size. These symptoms may represent a serious problem that is an emergency. Do not wait to see if the symptoms will go away. Get medical help right away. Call your local emergency services (911 in  the [place] Do not drive yourself to the hospital. This information is not intended to replace advice given to you by your health care provider. Make sure you discuss any questions you have with your health care provider. Document Released: 12/11/2005 Document Revised: 07/07/2016 Document Reviewed: 06/20/2016 Elsevier Interactive Patient Education  2017 [place] An abrasion is a cut or scrape on the outer surface of your skin. An abrasion does not extend through all of the layers of your skin. It is important to care for your abrasion properly to prevent infection. What are the causes? Most abrasions are caused by falling on or gliding across the ground or another surface. When your skin rubs on something, the outer and inner layer of skin rubs off. What are the signs or symptoms? A cut or scrape is the main symptom of this condition. The scrape may be bleeding, or it may appear red or pink. If there was an associated fall, there may be an underlying bruise. How is this diagnosed? An abrasion is diagnosed with a physical exam. How is this treated? Treatment for this condition depends on how large and deep the abrasion is. Usually, your abrasion will be cleaned with water and mild soap. This removes any dirt or debris that may be stuck. An antibiotic ointment may be applied to the abrasion to help prevent infection. A bandage (dressing) may be placed on the abrasion to keep it clean. You may also need a tetanus shot. Follow these instructions at home: Medicines  Take or apply medicines only as directed by your health care provider.  If you were prescribed an antibiotic ointment, finish all of it even if you start to feel better. Wound care  Clean the wound with  mild soap and water 2-3 times per day or as directed by your health care provider. Pat your wound dry with a clean towel. Do not rub it.  There are many different ways to close and cover a wound. Follow instructions  from your health care provider about: ? Wound care. ? Dressing changes and removal.  Check your wound every day for signs of infection. Watch for: ? Redness, swelling, or pain. ? Fluid, blood, or pus. General instructions   Keep the dressing dry as directed by your health care provider. Do not take baths, swim, use a hot tub, or do anything that would put your wound underwater until your health care provider approves.  If there is swelling, raise (elevate) the injured area above the level of your heart while you are sitting or lying down.  Keep all follow-up visits as directed by your health care provider. This is important. Contact a health care provider if:  You received a tetanus shot and you have swelling, severe pain, redness, or bleeding at the injection site.  Your pain is not controlled with medicine.  You have increased redness, swelling, or pain at the site of your wound. Get help right away if:  You have a red streak going away from your wound.  You have a fever.  You have fluid, blood, or pus coming from your wound.  You notice a bad smell coming from your wound or your dressing. This information is not intended to replace advice given to you by your health care provider. Make sure you discuss any questions you have with your health care provider. Document Released: 09/20/2005 Document Revised: 08/11/2016 Document Reviewed: 12/09/2014 Elsevier Interactive Patient Education  2017 [employer].    IF you received an x-ray today, you will receive an invoice from [facility]. Please contact [facility] at [phone] with questions or concerns regarding your invoice.   IF you received labwork today, you will receive an invoice from [place]. Please contact [facility] at [phone] with questions or concerns regarding your invoice.   Our billing staff will not be able to assist you with questions regarding bills from these companies.  You will be  contacted with the lab results as soon as they are available. The fastest way to get your results is to activate your My Chart account. Instructions are located on the last page of this paperwork. If you have not heard from [place] regarding the results in 2 weeks, please contact this office.       I personally performed the services described in this documentation, which was scribed in my presence. The recorded information has been reviewed and considered for accuracy and completeness, addended by me as needed, and agree with information above.  Signed,   Merri Ray, MD [facility].  06/16/18 10:01 PM

## 2018-06-13 NOTE — Patient Instructions (Addendum)
Blood pressure is running high in the office today.  Please return in next few weeks for recheck. No med changes for now. I will check blood tests.   Avoid working on ladders due to risk of falling.   Keep abrasions clean with soap and water and see information below. Allow the steri strips to fall off on their own. If any headache, dizziness, or worsening symptoms - go to the emergency room.   Unfortunately there is a break in your bone of the wrist.  I will have you follow that up with orthopedics, but keep splint in place for now.  Tylenol if needed for pain, please let me know if a stronger pain medication is needed.  s Cast or Splint Care, Adult Casts and splints are supports that are worn to protect broken bones and other injuries. A cast or splint may hold a bone still and in the correct position while it heals. Casts and splints may also help ease pain, swelling, and muscle spasms. A cast is a hardened support that is usually made of fiberglass or plaster. It is custom-fit to the body and it offers more protection than a splint. It cannot be taken off and put back on. A splint is a type of soft support that is usually made from cloth and elastic. It can be adjusted or taken off as needed. You may need a cast or a splint if you:  Have a broken bone.  Have a soft-tissue injury.  Need to keep an injured body part from moving (keep it immobile) after surgery.  How is this treated? If you have a cast:  Do not stick anything inside the cast to scratch your skin. Sticking something in the cast increases your risk of infection.  Check the skin around the cast every day. Tell your health care provider about any concerns.  You may put lotion on dry skin around the edges of the cast. Do not put lotion on the skin underneath the cast.  Keep the cast clean.  If the cast is not waterproof: ? Do not let it get wet. ? Cover it with a watertight covering when you take a bath or a shower. If  you have a splint:  Wear it as told by your health care provider. Remove it only as told by your health care provider.  Loosen the splint if your fingers or toes tingle, become numb, or turn cold and blue.  Keep the splint clean.  If the splint is not waterproof: ? Do not let it get wet. ? Cover it with a watertight covering when you take a bath or a shower. Bathing  Do not take baths or swim until your health care provider approves. Ask your health care provider if you can take showers. You may only be allowed to take sponge baths for bathing.  If your cast or splint is not waterproof, cover it with a watertight covering when you take a bath or shower. Managing pain, stiffness, and swelling  Move your fingers or toes often to avoid stiffness and to lessen swelling.  Raise (elevate) the injured area above the level of your heart while sitting or lying down. Safety  Do not use the injured limb to support your body weight until your health care provider says that it is okay.  Use crutches or other assistive devices as told by your health care provider. General instructions  Do not put pressure on any part of the cast or splint until  it is fully hardened. This may take several hours.  Return to your normal activities as told by your health care provider. Ask your health care provider what activities are safe for you.  Take over-the-counter and prescription medicines only as told by your health care provider.  Keep all follow-up visits as told by your health care provider. This is important. Contact a health care provider if:  Your cast or splint gets damaged.  The skin around the cast gets red or raw.  The skin under the cast is extremely itchy or painful.  Your cast or splint feels very uncomfortable.  Your cast or splint is too tight or too loose.  Your cast becomes wet or it develops a soft spot or area.  You get an object stuck under your cast. Get help right away  if:  Your pain is getting worse.  The injured area tingles, becomes numb, or turns cold and blue.  The part of your body above or below the cast is swollen and discolored.  You cannot feel or move your fingers or toes.  There is fluid leaking through the cast.  You have severe pain or pressure under the cast.  You have trouble breathing.  You have shortness of breath.  You have chest pain. This information is not intended to replace advice given to you by your health care provider. Make sure you discuss any questions you have with your health care provider. Document Released: 12/08/2000 Document Revised: 07/01/2016 Document Reviewed: 06/03/2016 Elsevier Interactive Patient Education  2018 [place] An abrasion is a cut or scrape on the outer surface of your skin. An abrasion does not extend through all of the layers of your skin. It is important to care for your abrasion properly to prevent infection. What are the causes? Most abrasions are caused by falling on or gliding across the ground or another surface. When your skin rubs on something, the outer and inner layer of skin rubs off. What are the signs or symptoms? A cut or scrape is the main symptom of this condition. The scrape may be bleeding, or it may appear red or pink. If there was an associated fall, there may be an underlying bruise. How is this diagnosed? An abrasion is diagnosed with a physical exam. How is this treated? Treatment for this condition depends on how large and deep the abrasion is. Usually, your abrasion will be cleaned with water and mild soap. This removes any dirt or debris that may be stuck. An antibiotic ointment may be applied to the abrasion to help prevent infection. A bandage (dressing) may be placed on the abrasion to keep it clean. You may also need a tetanus shot. Follow these instructions at home: Medicines  Take or apply medicines only as directed by your health care  provider.  If you were prescribed an antibiotic ointment, finish all of it even if you start to feel better. Wound care  Clean the wound with mild soap and water 2-3 times per day or as directed by your health care provider. Pat your wound dry with a clean towel. Do not rub it.  There are many different ways to close and cover a wound. Follow instructions from your health care provider about: ? Wound care. ? Dressing changes and removal.  Check your wound every day for signs of infection. Watch for: ? Redness, swelling, or pain. ? Fluid, blood, or pus. General instructions   Keep the dressing dry as directed  by your health care provider. Do not take baths, swim, use a hot tub, or do anything that would put your wound underwater until your health care provider approves.  If there is swelling, raise (elevate) the injured area above the level of your heart while you are sitting or lying down.  Keep all follow-up visits as directed by your health care provider. This is important. Contact a health care provider if:  You received a tetanus shot and you have swelling, severe pain, redness, or bleeding at the injection site.  Your pain is not controlled with medicine.  You have increased redness, swelling, or pain at the site of your wound. Get help right away if:  You have a red streak going away from your wound.  You have a fever.  You have fluid, blood, or pus coming from your wound.  You notice a bad smell coming from your wound or your dressing. This information is not intended to replace advice given to you by your health care provider. Make sure you discuss any questions you have with your health care provider. Document Released: 09/20/2005 Document Revised: 08/11/2016 Document Reviewed: 12/09/2014 Elsevier Interactive Patient Education  2017 [place] Injury, Adult There are many types of head injuries. Head injuries can be as minor as a bump, or they can be  more severe. More severe head injuries include:  A jarring injury to the brain (concussion).  A bruise of the brain (contusion). This means there is bleeding in the brain that can cause swelling.  A cracked skull (skull fracture).  Bleeding in the brain that collects, clots, and forms a bump (hematoma).  After a head injury, you may need to be observed for a while in the emergency department or urgent care. Sometimes admission to the hospital is needed. After a head injury has happened, most problems occur within the first 24 hours, but side effects may occur up to 7-10 days after the injury. It is important to watch your condition for any changes. What are the causes? There are many possible causes of a head injury. A serious head injury may happen to someone who is in a car accident (motor vehicle collision). Other causes of major head injuries include bicycle or motorcycle accidents, sports injuries, and falls. Risk factors This condition is more likely to occur in people who:  Drink a lot of alcohol or use drugs.  Are over the age of 33.  Are at risk for falls.  What are the symptoms? There are many possible symptoms of a head injury. Visible symptoms of a head injury include a bruise, bump, or bleeding at the site of the injury. Other non-visible symptoms include:  Feeling sleepy or not being able to stay awake.  Passing out.  Headache.  Seizures.  Dizziness.  Confusion.  Memory problems.  Nausea or vomiting.  Other possible symptoms that may develop after the head injury include:  Poor attention and concentration.  Fatigue or tiring easily.  Irritability.  Being uncomfortable around bright lights or loud noises.  Anxiety or depression.  Disturbed sleep.  How is this diagnosed? This condition can usually be diagnosed based on your symptoms, a description of the injury, and a physical exam. You may also have imaging tests done, such as a CT scan or MRI.  You will also be closely watched. How is this treated? Treatment for this condition depends on the severity and type of injury you have. The main goal of treatment is  to prevent complications and allow the brain time to heal. For mild head injury, you may be sent home and treatment may include:  Observation. A responsible adult should stay with you for 24 hours after your injury and check on you often.  Physical rest.  Brain rest.  Pain medicines.  For severe brain injury, treatment may include:  Close observation. This includes hospitalization with frequent physical exams. You may need to go to a hospital that specializes in head injury.  Pain medicines.  Breathing support. This may include using a ventilator.  Managing the pressure inside the brain (intracranial pressure, or ICP). This may include: ? Monitoring the ICP. ? Giving medicines to decrease the ICP. ? Positioning you to decrease the ICP.  Medicine to prevent seizures.  Surgery to stop bleeding or to remove blood clots (craniotomy).  Surgery to remove part of the skull (decompressive craniectomy). This allows room for the brain to swell.  Follow these instructions at home: Activity  Rest as much as possible and avoid activities that are physically hard or tiring.  Make sure you get enough sleep.  Limit activities that require a lot of thought or attention, such as: ? Watching TV. ? Playing memory games and puzzles. ? Job-related work or homework. ? Working on [employer], and texting.  Avoid activities that could cause another head injury, such as playing sports, until your health care provider approves. Having another head injury, especially before the first one has healed, can be dangerous.  Ask your health care provider when it is safe for you to return to your regular activities, including work or school. Ask your health care provider for a step-by-step plan for gradually returning to  activities.  Ask your health care provider when you can drive, ride a bicycle, or use heavy machinery. Your ability to react may be slower after a brain injury. Never do these activities if you are dizzy.  Lifestyle  Do not drink alcohol until your health care provider approves, and avoid drug use. Alcohol and certain drugs may slow your recovery and can put you at risk of further injury.  If it is harder than usual to remember things, write them down.  If you are easily distracted, try to do one thing at a time.  Talk with family members or close friends when making important decisions.  Tell your friends, family, a trusted colleague, and work [employer] about your injury, symptoms, and restrictions. Have them watch for any new or worsening problems.  General instructions  Take over-the-counter and prescription medicines only as told by your health care provider.  Have someone stay with you for 24 hours after your head injury. This person should watch you for any changes in your symptoms and be ready to seek medical help, as needed.  Keep all follow-up visits as told by your health care provider. This is important.  Prevention  Work on improving your balance and strength to avoid falls.  Wear a seatbelt when you are in a moving vehicle.  Wear a helmet when riding a bicycle, skiing, or doing any other sport or activity that has a risk of injury.  Drink alcohol only in moderation.  Take safety measures in your home, such as: ? Removing clutter and tripping hazards from floors and stairways. ? Using grab bars in bathrooms and handrails by stairs. ? Placing non-slip mats on floors and in bathtubs. ? Improving lighting in dim areas. Get help right away if:  You have: ?  A severe headache that is not helped by medicine. ? Trouble walking, have weakness in your arms and legs, or lose your balance. ? Clear or bloody fluid coming from your nose or ears. ? Changes in your vision. ? A  seizure.  You vomit.  Your symptoms get worse.  Your speech is slurred.  You pass out.  You are sleepier and have trouble staying awake.  Your pupils change size. These symptoms may represent a serious problem that is an emergency. Do not wait to see if the symptoms will go away. Get medical help right away. Call your local emergency services (911 in the [place] Do not drive yourself to the hospital. This information is not intended to replace advice given to you by your health care provider. Make sure you discuss any questions you have with your health care provider. Document Released: 12/11/2005 Document Revised: 07/07/2016 Document Reviewed: 06/20/2016 Elsevier Interactive Patient Education  2017 [place] An abrasion is a cut or scrape on the outer surface of your skin. An abrasion does not extend through all of the layers of your skin. It is important to care for your abrasion properly to prevent infection. What are the causes? Most abrasions are caused by falling on or gliding across the ground or another surface. When your skin rubs on something, the outer and inner layer of skin rubs off. What are the signs or symptoms? A cut or scrape is the main symptom of this condition. The scrape may be bleeding, or it may appear red or pink. If there was an associated fall, there may be an underlying bruise. How is this diagnosed? An abrasion is diagnosed with a physical exam. How is this treated? Treatment for this condition depends on how large and deep the abrasion is. Usually, your abrasion will be cleaned with water and mild soap. This removes any dirt or debris that may be stuck. An antibiotic ointment may be applied to the abrasion to help prevent infection. A bandage (dressing) may be placed on the abrasion to keep it clean. You may also need a tetanus shot. Follow these instructions at home: Medicines  Take or apply medicines only as directed by your health care  provider.  If you were prescribed an antibiotic ointment, finish all of it even if you start to feel better. Wound care  Clean the wound with mild soap and water 2-3 times per day or as directed by your health care provider. Pat your wound dry with a clean towel. Do not rub it.  There are many different ways to close and cover a wound. Follow instructions from your health care provider about: ? Wound care. ? Dressing changes and removal.  Check your wound every day for signs of infection. Watch for: ? Redness, swelling, or pain. ? Fluid, blood, or pus. General instructions   Keep the dressing dry as directed by your health care provider. Do not take baths, swim, use a hot tub, or do anything that would put your wound underwater until your health care provider approves.  If there is swelling, raise (elevate) the injured area above the level of your heart while you are sitting or lying down.  Keep all follow-up visits as directed by your health care provider. This is important. Contact a health care provider if:  You received a tetanus shot and you have swelling, severe pain, redness, or bleeding at the injection site.  Your pain is not controlled with medicine.  You have increased  redness, swelling, or pain at the site of your wound. Get help right away if:  You have a red streak going away from your wound.  You have a fever.  You have fluid, blood, or pus coming from your wound.  You notice a bad smell coming from your wound or your dressing. This information is not intended to replace advice given to you by your health care provider. Make sure you discuss any questions you have with your health care provider. Document Released: 09/20/2005 Document Revised: 08/11/2016 Document Reviewed: 12/09/2014 Elsevier Interactive Patient Education  2017 [employer].    IF you received an x-ray today, you will receive an invoice from [facility]. Please contact [facility] at [phone] with questions or concerns regarding your invoice.   IF you received labwork today, you will receive an invoice from [place]. Please contact [facility] at [phone] with questions or concerns regarding your invoice.   Our billing staff will not be able to assist you with questions regarding bills from these companies.  You will be contacted with the lab results as soon as they are available. The fastest way to get your results is to activate your My Chart account. Instructions are located on the last page of this paperwork. If you have not heard from [place] regarding the results in 2 weeks, please contact this office.

## 2018-06-14 LAB — COMPREHENSIVE METABOLIC PANEL
ALBUMIN: 4.5 g/dL (ref 3.2–4.6)
ALK PHOS: 62 IU/L (ref 39–117)
ALT: 18 IU/L (ref 0–44)
AST: 19 IU/L (ref 0–40)
Albumin/Globulin Ratio: 1.6 (ref 1.2–2.2)
BUN / CREAT RATIO: 19 (ref 10–24)
BUN: 15 mg/dL (ref 10–36)
Bilirubin Total: 1.3 mg/dL — ABNORMAL HIGH (ref 0.0–1.2)
CO2: 22 mmol/L (ref 20–29)
CREATININE: 0.8 mg/dL (ref 0.76–1.27)
Calcium: 9.7 mg/dL (ref 8.6–10.2)
Chloride: 102 mmol/L (ref 96–106)
GFR calc Af Amer: 91 mL/min/{1.73_m2} (ref >59)
GFR calc non Af Amer: 79 mL/min/{1.73_m2} (ref >59)
GLUCOSE: 96 mg/dL (ref 65–99)
Globulin, Total: 2.9 g/dL (ref 1.5–4.5)
Potassium: 4.3 mmol/L (ref 3.5–5.2)
Sodium: 140 mmol/L (ref 134–144)
Total Protein: 7.4 g/dL (ref 6.0–8.5)

## 2018-06-14 LAB — LIPID PANEL
CHOLESTEROL TOTAL: 144 mg/dL (ref 100–199)
Chol/HDL Ratio: 2.6 ratio (ref 0.0–5.0)
HDL: 56 mg/dL (ref >39)
LDL CALC: 70 mg/dL (ref 0–99)
TRIGLYCERIDES: 91 mg/dL (ref 0–149)
VLDL CHOLESTEROL CAL: 18 mg/dL (ref 5–40)

## 2018-06-14 LAB — HEMOGLOBIN A1C
ESTIMATED AVERAGE GLUCOSE: 120 mg/dL
Hgb A1c MFr Bld: 5.8 % — ABNORMAL HIGH (ref 4.8–5.6)

## 2018-06-19 ENCOUNTER — Encounter (INDEPENDENT_AMBULATORY_CARE_PROVIDER_SITE_OTHER): Payer: Self-pay | Admitting: Orthopedic Surgery

## 2018-06-19 ENCOUNTER — Ambulatory Visit (INDEPENDENT_AMBULATORY_CARE_PROVIDER_SITE_OTHER): Payer: Medicare Other | Admitting: Orthopedic Surgery

## 2018-06-19 DIAGNOSIS — S62101A Fracture of unspecified carpal bone, right wrist, initial encounter for closed fracture: Secondary | ICD-10-CM

## 2018-06-19 NOTE — Progress Notes (Signed)
   Office Visit Note   Patient: Nicholas Finley           Date of Birth: 10/16/1926           MRN: [id number] Visit Date: 06/19/2018 Requested by: [name], MD [address] [place], [place] [address] PCP: [name], MD  Subjective: Chief Complaint  Patient presents with  . Right Wrist - Injury    HPI: Nicholas Finley is a patient with right wrist pain.  Date of injury 06/13/2018.  Injury happened when he fell down the steps.  He is right-hand dominant.  He likes to smoke cigars.  He denies any other orthopedic complaints.              ROS: All systems reviewed are negative as they relate to the chief complaint within the history of present illness.  Patient denies  fevers or chills.   Assessment & Plan: Visit Diagnoses:  1. Right wrist fracture, closed, initial encounter     Plan: Impression is right wrist pain with minimally displaced ulnar facet fracture.  He has good pronation and supination today without mechanical block.  Plan is stockinette plus removable long wrist splint which he should wear at all times except showers.  2-week return and repeat radiographs at that time.  Do not anticipate that this will be an operative problem.  Follow-Up Instructions: Return in about 2 weeks (around 07/03/2018).   Orders:  No orders of the defined types were placed in this encounter.  No orders of the defined types were placed in this encounter.     Procedures: No procedures performed   Clinical Data: No additional findings.  Objective: Vital Signs: There were no vitals taken for this visit.  Physical Exam:   Constitutional: Patient appears well-developed HEENT:  Head: Normocephalic Eyes:EOM are normal Neck: Normal range of motion Cardiovascular: Normal rate Pulmonary/chest: Effort normal Neurologic: Patient is alert Skin: Skin is warm Psychiatric: Patient has normal mood and affect    Ortho Exam: Orthopedic exam demonstrates bruising and ecchymosis in  the right wrist but compartments are soft.  Radial pulses intact.  Motor or sensory function to the hand is intact.  Abrasion is present forearm that is healing with Steri-Strips.  Elbow range of motion full  Specialty Comments:  No specialty comments available.  Imaging: No results found.   PMFS History: Patient Active Problem List   Diagnosis Date Noted  . HTN (hypertension) 03/01/2012  . Hyperlipidemia 03/01/2012  . Hearing difficulty 03/01/2012  . Allergic rhinitis 03/01/2012   Past Medical History:  Diagnosis Date  . Allergy   . Cataract   . Enlarged prostate   . Heart murmur   . Hypercholesteremia   . Hypertension     Family History  Problem Relation Age of Onset  . Heart failure Father   . Heart Problems Sister     Past Surgical History:  Procedure Laterality Date  . CYST EXCISION     pilonidal  . SKIN CANCER EXCISION     Social History   Occupational History    Comment: retired  Tobacco Use  . Smoking status: Current Some Day Smoker    Types: Cigarettes, Pipe, Cigars  . Smokeless tobacco: Never Used  . Tobacco comment: cigars   Substance and Sexual Activity  . Alcohol use: Yes    Alcohol/week: 0.0 oz    Comment: occas beer  . Drug use: No  . Sexual activity: Not on file

## 2018-06-21 ENCOUNTER — Encounter (HOSPITAL_COMMUNITY): Payer: Self-pay

## 2018-06-21 ENCOUNTER — Other Ambulatory Visit: Payer: Self-pay

## 2018-06-21 ENCOUNTER — Emergency Department (HOSPITAL_COMMUNITY)
Admission: EM | Admit: 2018-06-21 | Discharge: 2018-06-21 | Disposition: A | Discharge status: Home / Self Care | Payer: Medicare Other | Attending: Emergency Medicine | Admitting: Emergency Medicine

## 2018-06-21 DIAGNOSIS — R300 Dysuria: Secondary | ICD-10-CM | POA: Diagnosis not present

## 2018-06-21 DIAGNOSIS — Z7982 Long term (current) use of aspirin: Secondary | ICD-10-CM | POA: Diagnosis not present

## 2018-06-21 DIAGNOSIS — I10 Essential (primary) hypertension: Secondary | ICD-10-CM | POA: Insufficient documentation

## 2018-06-21 DIAGNOSIS — F17210 Nicotine dependence, cigarettes, uncomplicated: Secondary | ICD-10-CM | POA: Diagnosis not present

## 2018-06-21 DIAGNOSIS — Z79899 Other long term (current) drug therapy: Secondary | ICD-10-CM | POA: Insufficient documentation

## 2018-06-21 DIAGNOSIS — R319 Hematuria, unspecified: Secondary | ICD-10-CM | POA: Diagnosis not present

## 2018-06-21 DIAGNOSIS — Z85828 Personal history of other malignant neoplasm of skin: Secondary | ICD-10-CM | POA: Insufficient documentation

## 2018-06-21 LAB — I-STAT CHEM 8, ED
BUN: 13 mg/dL (ref 8–23)
Calcium, Ion: 1.14 mmol/L — ABNORMAL LOW (ref 1.15–1.40)
Chloride: 102 mmol/L (ref 98–111)
Creatinine, Ser: 0.8 mg/dL (ref 0.61–1.24)
Glucose, Bld: 102 mg/dL — ABNORMAL HIGH (ref 70–99)
HEMATOCRIT: 42 % (ref 39.0–52.0)
HEMOGLOBIN: 14.3 g/dL (ref 13.0–17.0)
Potassium: 3.7 mmol/L (ref 3.5–5.1)
SODIUM: 140 mmol/L (ref 135–145)
TCO2: 24 mmol/L (ref 22–32)

## 2018-06-21 LAB — URINALYSIS, ROUTINE W REFLEX MICROSCOPIC
BACTERIA UA: NONE SEEN
BILIRUBIN URINE: NEGATIVE
Glucose, UA: NEGATIVE mg/dL
KETONES UR: NEGATIVE mg/dL
LEUKOCYTES UA: NEGATIVE
NITRITE: NEGATIVE
PROTEIN: NEGATIVE mg/dL
Specific Gravity, Urine: 1.006 (ref 1.005–1.030)
pH: 8 (ref 5.0–8.0)

## 2018-06-21 NOTE — ED Notes (Signed)
Bladder scan 156ml highest reading.

## 2018-06-21 NOTE — ED Provider Notes (Addendum)
[facility] DEPT Provider Note   CSN: [id number] Arrival date & time: 06/21/18  1309     History   Chief Complaint Chief Complaint  Patient presents with  . Hematuria    HPI Nicholas Finley is a 84 y.o. male.  Patient is a 84 year old male with a history of hypertension, hyperlipidemia and enlarged prostate who presents with hematuria.  He states earlier this morning he had one episode of burning on urination.  Later that day he noticed when he urinated the initial urine looked bloody.  Since that time he has had some faint episodes of blood in his urine.  He denies any clots.  He denies any ongoing pain on urination.  No back pain.  No abdominal pain.  No fevers.  No nausea or vomiting.  No history of similar symptoms in the past.  He was treated for a UTI about 3 years ago and followed up with [facility] after that.  He is on a baby aspirin but no other anticoagulants.     Past Medical History:  Diagnosis Date  . Allergy   . Cataract   . Enlarged prostate   . Heart murmur   . Hypercholesteremia   . Hypertension     Patient Active Problem List   Diagnosis Date Noted  . HTN (hypertension) 03/01/2012  . Hyperlipidemia 03/01/2012  . Hearing difficulty 03/01/2012  . Allergic rhinitis 03/01/2012    Past Surgical History:  Procedure Laterality Date  . CYST EXCISION     pilonidal  . SKIN CANCER EXCISION          Home Medications    Prior to Admission medications   Medication Sig Start Date End Date Taking? Authorizing Provider  aspirin 81 MG tablet Take 81 mg by mouth daily.   Yes [provider]  atorvastatin (LIPITOR) 10 MG tablet Take 1 tablet (10 mg total) by mouth daily. Patient taking differently: 10 mg daily.  06/13/18  Yes [name], MD  Azelastine HCl (ASTEPRO) 0.15 % SOLN instill 1 spray into each nostril twice a day if needed 12/07/16  Yes [name], MD  lisinopril (PRINIVIL,ZESTRIL) 10 MG  tablet Take 1 tablet (10 mg total) by mouth daily. 06/13/18  Yes [name], MD    Family History Family History  Problem Relation Age of Onset  . Heart failure Father   . Heart Problems Sister     Social History Social History   Tobacco Use  . Smoking status: Current Some Day Smoker    Types: Cigarettes, Pipe, Cigars  . Smokeless tobacco: Never Used  . Tobacco comment: cigars   Substance Use Topics  . Alcohol use: Yes    Alcohol/week: 0.0 oz    Comment: occas beer  . Drug use: No     Allergies   Patient has no known allergies.   Review of Systems Review of Systems  Constitutional: Negative for chills, diaphoresis, fatigue and fever.  HENT: Negative for congestion, rhinorrhea and sneezing.   Eyes: Negative.   Respiratory: Negative for cough, chest tightness and shortness of breath.   Cardiovascular: Negative for chest pain and leg swelling.  Gastrointestinal: Negative for abdominal pain, blood in stool, diarrhea, nausea and vomiting.  Genitourinary: Positive for hematuria. Negative for difficulty urinating, flank pain and frequency.  Musculoskeletal: Negative for arthralgias and back pain.  Skin: Negative for rash.  Neurological: Negative for dizziness, speech difficulty, weakness, numbness and headaches.     Physical Exam Updated  Vital Signs BP (!) 179/99 (BP Location: Left Arm)   Pulse 80   Temp 98 F (36.7 C) (Oral)   Resp 17   Ht 5' 6.5" (1.689 m)   Wt 72.1 kg (159 lb)   SpO2 98%   BMI 25.28 kg/m   Physical Exam  Constitutional: He is oriented to person, place, and time. He appears well-developed and well-nourished.  HENT:  Head: Normocephalic and atraumatic.  Eyes: Pupils are equal, round, and reactive to light.  Neck: Normal range of motion. Neck supple.  Cardiovascular: Normal rate and regular rhythm.  Murmur heard. Pulmonary/Chest: Effort normal and breath sounds normal. No respiratory distress. He has no wheezes. He has no rales. He  exhibits no tenderness.  Abdominal: Soft. Bowel sounds are normal. There is no tenderness. There is no rebound and no guarding.  Musculoskeletal: Normal range of motion. He exhibits no edema.  Lymphadenopathy:    He has no cervical adenopathy.  Neurological: He is alert and oriented to person, place, and time.  Skin: Skin is warm and dry. No rash noted.  Psychiatric: He has a normal mood and affect.     ED Treatments / Results  Labs (all labs ordered are listed, but only abnormal results are displayed) Labs Reviewed  URINALYSIS, ROUTINE W REFLEX MICROSCOPIC - Abnormal; Notable for the following components:      Result Value   Color, Urine STRAW (*)    Hgb urine dipstick LARGE (*)    RBC / HPF >50 (*)    All other components within normal limits  I-STAT CHEM 8, ED - Abnormal; Notable for the following components:   Glucose, Bld 102 (*)    Calcium, Ion 1.14 (*)    All other components within normal limits    EKG None  Radiology No results found.  Procedures Procedures (including critical care time)  Medications Ordered in ED Medications - No data to display   Initial Impression / Assessment and Plan / ED Course  I have reviewed the triage vital signs and the nursing notes.  Pertinent labs & imaging results that were available during my care of the patient were reviewed by me and considered in my medical decision making (see chart for details).     Patient is a 84 year old male who presents with hematuria.  He is able to urinate without difficulty.  He has no evidence of obstruction.  He has no associated pain which we more concerning for renal colic.  He has no ongoing bleeding.  He has no noticeable clots.  There is no evidence of infection.  His hemoglobin is normal.  His creatinine is normal.  He was discharged home in good condition.  He was encouraged to drink water to flush his bladder and have follow-up with urology for a hematuria work-up.  Return precautions were  given.  Pt lives independently but reportedly has a healthcare power of attorney who lives in [place].  I attempted to call this person to notify them of the upcoming discharge but the phone number was reportedly the wrong number.  Final Clinical Impressions(s) / ED Diagnoses   Final diagnoses:  Painless hematuria    ED Discharge Orders    None       Malvin Johns, MD 06/21/18 1650    Malvin Johns, MD 06/21/18 1816

## 2018-06-21 NOTE — ED Notes (Signed)
Pt aware urine sample is needed 

## 2018-06-21 NOTE — ED Triage Notes (Addendum)
Patient c/o hematuria and dysuria today. Patient denies any fever.

## 2018-06-21 NOTE — Progress Notes (Addendum)
CSW received a call stating from the ED secretary stating pt is unable to D/C due to an FYI flag from 08/07/17 that states, "Pt has a legal guardian".  CSW reviewed the chart and sees no cooraborating evidence of this, no guardianship papers in the media file of Epic showing guardianhip and the only contact on the pt's facesheet is the pt's "friend" Alferd Patee at ph: [phone] and (the pt's home #) [phone].   CSW also reviewed pt's encounters and per notes from the encounters on 08/07/18 there are no other mentions of a legal guardian at the pt's office visit at [facility] and pt DID sign his Patient Consent Form paperwork at [facility] instead of a legal guardian.  CSW consulted the CSW Asst Director who confirmed there is no name/phone number listed for a legal guardian and directed the CSW to confirm if possible that someone is aware that the pt is D/C'ing.  CSW noted I chart pt has a friend listed above and will request from the pt that the CSW can contact someone to advise them that pt is D/C'ing and that pt is A&O.  CSW will update RN and EPD.  CSW will continue to follow for D/C needs.  Alphonse Guild. Macklin Jacquin, LCSW, LCAS, CSI Clinical Social Worker Ph: [phone]                                                                              v

## 2018-07-03 ENCOUNTER — Ambulatory Visit (INDEPENDENT_AMBULATORY_CARE_PROVIDER_SITE_OTHER): Payer: Medicare Other | Admitting: Orthopedic Surgery

## 2018-07-03 ENCOUNTER — Ambulatory Visit (INDEPENDENT_AMBULATORY_CARE_PROVIDER_SITE_OTHER): Payer: Medicare Other

## 2018-07-03 ENCOUNTER — Encounter (INDEPENDENT_AMBULATORY_CARE_PROVIDER_SITE_OTHER): Payer: Self-pay | Admitting: Orthopedic Surgery

## 2018-07-03 DIAGNOSIS — S62101A Fracture of unspecified carpal bone, right wrist, initial encounter for closed fracture: Secondary | ICD-10-CM

## 2018-07-07 NOTE — Progress Notes (Signed)
   Post-Op Visit Note   Patient: Nicholas Finley           Date of Birth: [birth year]           MRN: [id number] Visit Date: 07/03/2018 PCP: [name], MD   Assessment & Plan:  Chief Complaint:  Chief Complaint  Patient presents with  . Right Wrist - Follow-up   Visit Diagnoses:  1. Right wrist fracture, closed, initial encounter     Plan: Nicholas Finley is a patient with right wrist fracture here for follow-up.  Is been doing reasonably well.  On examination he has diminished pain with palpation.  Reasonable grip strength which is predictably less than the left-hand side.  Radial pulse intact.  Plan is to start physical therapy next week.  With stockinette and wrist splint for now but then I would like for him to discontinue that at 5 weeks postop.  I will see him back in 6 weeks for final clinical check.  I do think he is going to have some decreased flexion strength and grip strength but operative indication not in this patient based on available literature results.  Follow-Up Instructions: Return in about 6 weeks (around 08/14/2018).   Orders:  Orders Placed This Encounter  Procedures  . XR Wrist Complete Right  . Ambulatory referral to Physical Therapy   No orders of the defined types were placed in this encounter.   Imaging: No results found.  PMFS History: Patient Active Problem List   Diagnosis Date Noted  . HTN (hypertension) 03/01/2012  . Hyperlipidemia 03/01/2012  . Hearing difficulty 03/01/2012  . Allergic rhinitis 03/01/2012   Past Medical History:  Diagnosis Date  . Allergy   . Cataract   . Enlarged prostate   . Heart murmur   . Hypercholesteremia   . Hypertension     Family History  Problem Relation Age of Onset  . Heart failure Father   . Heart Problems Sister     Past Surgical History:  Procedure Laterality Date  . CYST EXCISION     pilonidal  . SKIN CANCER EXCISION     Social History   Occupational History    Comment: retired    Tobacco Use  . Smoking status: Current Some Day Smoker    Types: Cigarettes, Pipe, Cigars  . Smokeless tobacco: Never Used  . Tobacco comment: cigars   Substance and Sexual Activity  . Alcohol use: Yes    Alcohol/week: 0.0 oz    Comment: occas beer  . Drug use: No  . Sexual activity: Not on file

## 2018-07-12 DIAGNOSIS — S6291XD Unspecified fracture of right wrist and hand, subsequent encounter for fracture with routine healing: Secondary | ICD-10-CM | POA: Diagnosis not present

## 2018-07-12 DIAGNOSIS — M25531 Pain in right wrist: Secondary | ICD-10-CM | POA: Diagnosis not present

## 2018-07-12 DIAGNOSIS — M6281 Muscle weakness (generalized): Secondary | ICD-10-CM | POA: Diagnosis not present

## 2018-07-16 DIAGNOSIS — M25531 Pain in right wrist: Secondary | ICD-10-CM | POA: Diagnosis not present

## 2018-07-16 DIAGNOSIS — M6281 Muscle weakness (generalized): Secondary | ICD-10-CM | POA: Diagnosis not present

## 2018-07-16 DIAGNOSIS — S6291XD Unspecified fracture of right wrist and hand, subsequent encounter for fracture with routine healing: Secondary | ICD-10-CM | POA: Diagnosis not present

## 2018-07-18 DIAGNOSIS — S6291XD Unspecified fracture of right wrist and hand, subsequent encounter for fracture with routine healing: Secondary | ICD-10-CM | POA: Diagnosis not present

## 2018-07-18 DIAGNOSIS — M6281 Muscle weakness (generalized): Secondary | ICD-10-CM | POA: Diagnosis not present

## 2018-07-18 DIAGNOSIS — M25531 Pain in right wrist: Secondary | ICD-10-CM | POA: Diagnosis not present

## 2018-07-19 DIAGNOSIS — R3912 Poor urinary stream: Secondary | ICD-10-CM | POA: Diagnosis not present

## 2018-07-19 DIAGNOSIS — R350 Frequency of micturition: Secondary | ICD-10-CM | POA: Diagnosis not present

## 2018-07-19 DIAGNOSIS — R351 Nocturia: Secondary | ICD-10-CM | POA: Diagnosis not present

## 2018-07-19 DIAGNOSIS — R310 Gross hematuria: Secondary | ICD-10-CM | POA: Diagnosis not present

## 2018-07-22 DIAGNOSIS — M25531 Pain in right wrist: Secondary | ICD-10-CM | POA: Diagnosis not present

## 2018-07-22 DIAGNOSIS — S6291XD Unspecified fracture of right wrist and hand, subsequent encounter for fracture with routine healing: Secondary | ICD-10-CM | POA: Diagnosis not present

## 2018-07-22 DIAGNOSIS — M6281 Muscle weakness (generalized): Secondary | ICD-10-CM | POA: Diagnosis not present

## 2018-07-24 DIAGNOSIS — M6281 Muscle weakness (generalized): Secondary | ICD-10-CM | POA: Diagnosis not present

## 2018-07-24 DIAGNOSIS — M25531 Pain in right wrist: Secondary | ICD-10-CM | POA: Diagnosis not present

## 2018-07-24 DIAGNOSIS — S6291XD Unspecified fracture of right wrist and hand, subsequent encounter for fracture with routine healing: Secondary | ICD-10-CM | POA: Diagnosis not present

## 2018-07-29 DIAGNOSIS — S6291XD Unspecified fracture of right wrist and hand, subsequent encounter for fracture with routine healing: Secondary | ICD-10-CM | POA: Diagnosis not present

## 2018-07-29 DIAGNOSIS — M25531 Pain in right wrist: Secondary | ICD-10-CM | POA: Diagnosis not present

## 2018-07-29 DIAGNOSIS — M6281 Muscle weakness (generalized): Secondary | ICD-10-CM | POA: Diagnosis not present

## 2018-07-31 DIAGNOSIS — M6281 Muscle weakness (generalized): Secondary | ICD-10-CM | POA: Diagnosis not present

## 2018-07-31 DIAGNOSIS — S6291XD Unspecified fracture of right wrist and hand, subsequent encounter for fracture with routine healing: Secondary | ICD-10-CM | POA: Diagnosis not present

## 2018-07-31 DIAGNOSIS — M25531 Pain in right wrist: Secondary | ICD-10-CM | POA: Diagnosis not present

## 2018-08-05 DIAGNOSIS — M25531 Pain in right wrist: Secondary | ICD-10-CM | POA: Diagnosis not present

## 2018-08-05 DIAGNOSIS — S6291XD Unspecified fracture of right wrist and hand, subsequent encounter for fracture with routine healing: Secondary | ICD-10-CM | POA: Diagnosis not present

## 2018-08-05 DIAGNOSIS — M6281 Muscle weakness (generalized): Secondary | ICD-10-CM | POA: Diagnosis not present

## 2018-08-07 DIAGNOSIS — M25531 Pain in right wrist: Secondary | ICD-10-CM | POA: Diagnosis not present

## 2018-08-07 DIAGNOSIS — M6281 Muscle weakness (generalized): Secondary | ICD-10-CM | POA: Diagnosis not present

## 2018-08-07 DIAGNOSIS — S6291XD Unspecified fracture of right wrist and hand, subsequent encounter for fracture with routine healing: Secondary | ICD-10-CM | POA: Diagnosis not present

## 2018-08-12 DIAGNOSIS — S6291XD Unspecified fracture of right wrist and hand, subsequent encounter for fracture with routine healing: Secondary | ICD-10-CM | POA: Diagnosis not present

## 2018-08-12 DIAGNOSIS — M25531 Pain in right wrist: Secondary | ICD-10-CM | POA: Diagnosis not present

## 2018-08-12 DIAGNOSIS — M6281 Muscle weakness (generalized): Secondary | ICD-10-CM | POA: Diagnosis not present

## 2018-08-14 ENCOUNTER — Encounter (INDEPENDENT_AMBULATORY_CARE_PROVIDER_SITE_OTHER): Payer: Self-pay | Admitting: Orthopedic Surgery

## 2018-08-14 ENCOUNTER — Ambulatory Visit (INDEPENDENT_AMBULATORY_CARE_PROVIDER_SITE_OTHER): Payer: Medicare Other | Admitting: Orthopedic Surgery

## 2018-08-14 ENCOUNTER — Ambulatory Visit (INDEPENDENT_AMBULATORY_CARE_PROVIDER_SITE_OTHER): Payer: Medicare Other

## 2018-08-14 DIAGNOSIS — S62101A Fracture of unspecified carpal bone, right wrist, initial encounter for closed fracture: Secondary | ICD-10-CM

## 2018-08-14 NOTE — Progress Notes (Signed)
   Post-Op Visit Note   Patient: Nicholas Finley           Date of Birth: 09-22-1926           MRN: [id number] Visit Date: 08/14/2018 PCP: [name], MD  Patient follows up 2 months out right wrist fracture.  He is in physical therapy.  On exam he has improving range of motion pronation supination as well as flexion and extension.  Grip strength also improving.  No significant tenderness to palpation at the fracture site.  Plan is to continue therapy for another 2 weeks and progress to home exercise program.  Radiographs show good healing.  I will see him back as needed. Assessment & Plan: See above  Chief Complaint:  Chief Complaint  Patient presents with  . Right Wrist - Follow-up, Fracture   Visit Diagnoses:  1. Right wrist fracture, closed, initial encounter     Plan: See above  Follow-Up Instructions: Return if symptoms worsen or fail to improve.   Orders:  Orders Placed This Encounter  Procedures  . XR Wrist Complete Right   No orders of the defined types were placed in this encounter.   Imaging: Xr Wrist Complete Right  Result Date: 08/14/2018 AP lateral oblique right wrist reviewed.  Distal radius fracture in good position alignment with some fracture callus formation noted.  No significant change from prior radiographs.  There is some dorsal angulation in loss of height and inclination but it is not appreciably changed compared to previous radiographs   PMFS History: Patient Active Problem List   Diagnosis Date Noted  . HTN (hypertension) 03/01/2012  . Hyperlipidemia 03/01/2012  . Hearing difficulty 03/01/2012  . Allergic rhinitis 03/01/2012   Past Medical History:  Diagnosis Date  . Allergy   . Cataract   . Enlarged prostate   . Heart murmur   . Hypercholesteremia   . Hypertension     Family History  Problem Relation Age of Onset  . Heart failure Father   . Heart Problems Sister     Past Surgical History:  Procedure Laterality Date  .  CYST EXCISION     pilonidal  . SKIN CANCER EXCISION     Social History   Occupational History    Comment: retired  Tobacco Use  . Smoking status: Current Some Day Smoker    Types: Cigarettes, Pipe, Cigars  . Smokeless tobacco: Never Used  . Tobacco comment: cigars   Substance and Sexual Activity  . Alcohol use: Yes    Alcohol/week: 0.0 standard drinks    Comment: occas beer  . Drug use: No  . Sexual activity: Not on file

## 2018-08-19 DIAGNOSIS — M25531 Pain in right wrist: Secondary | ICD-10-CM | POA: Diagnosis not present

## 2018-08-19 DIAGNOSIS — M6281 Muscle weakness (generalized): Secondary | ICD-10-CM | POA: Diagnosis not present

## 2018-08-19 DIAGNOSIS — S6291XD Unspecified fracture of right wrist and hand, subsequent encounter for fracture with routine healing: Secondary | ICD-10-CM | POA: Diagnosis not present

## 2018-08-21 DIAGNOSIS — M6281 Muscle weakness (generalized): Secondary | ICD-10-CM | POA: Diagnosis not present

## 2018-08-21 DIAGNOSIS — S6291XD Unspecified fracture of right wrist and hand, subsequent encounter for fracture with routine healing: Secondary | ICD-10-CM | POA: Diagnosis not present

## 2018-08-21 DIAGNOSIS — M25531 Pain in right wrist: Secondary | ICD-10-CM | POA: Diagnosis not present

## 2018-08-28 DIAGNOSIS — M25531 Pain in right wrist: Secondary | ICD-10-CM | POA: Diagnosis not present

## 2018-08-28 DIAGNOSIS — M6281 Muscle weakness (generalized): Secondary | ICD-10-CM | POA: Diagnosis not present

## 2018-08-28 DIAGNOSIS — S6291XD Unspecified fracture of right wrist and hand, subsequent encounter for fracture with routine healing: Secondary | ICD-10-CM | POA: Diagnosis not present

## 2018-09-05 DIAGNOSIS — M6281 Muscle weakness (generalized): Secondary | ICD-10-CM | POA: Diagnosis not present

## 2018-09-05 DIAGNOSIS — S6291XD Unspecified fracture of right wrist and hand, subsequent encounter for fracture with routine healing: Secondary | ICD-10-CM | POA: Diagnosis not present

## 2018-09-05 DIAGNOSIS — M25531 Pain in right wrist: Secondary | ICD-10-CM | POA: Diagnosis not present

## 2018-09-30 DIAGNOSIS — M6281 Muscle weakness (generalized): Secondary | ICD-10-CM | POA: Diagnosis not present

## 2018-09-30 DIAGNOSIS — S6291XD Unspecified fracture of right wrist and hand, subsequent encounter for fracture with routine healing: Secondary | ICD-10-CM | POA: Diagnosis not present

## 2018-09-30 DIAGNOSIS — M25531 Pain in right wrist: Secondary | ICD-10-CM | POA: Diagnosis not present

## 2018-10-21 DIAGNOSIS — H0100A Unspecified blepharitis right eye, upper and lower eyelids: Secondary | ICD-10-CM | POA: Diagnosis not present

## 2018-10-21 DIAGNOSIS — H353132 Nonexudative age-related macular degeneration, bilateral, intermediate dry stage: Secondary | ICD-10-CM | POA: Diagnosis not present

## 2018-10-21 DIAGNOSIS — H52203 Unspecified astigmatism, bilateral: Secondary | ICD-10-CM | POA: Diagnosis not present

## 2018-10-21 DIAGNOSIS — H40053 Ocular hypertension, bilateral: Secondary | ICD-10-CM | POA: Diagnosis not present

## 2018-11-10 DIAGNOSIS — Z23 Encounter for immunization: Secondary | ICD-10-CM | POA: Diagnosis not present

## 2018-11-18 DIAGNOSIS — L814 Other melanin hyperpigmentation: Secondary | ICD-10-CM | POA: Diagnosis not present

## 2018-11-18 DIAGNOSIS — L570 Actinic keratosis: Secondary | ICD-10-CM | POA: Diagnosis not present

## 2018-11-18 DIAGNOSIS — L821 Other seborrheic keratosis: Secondary | ICD-10-CM | POA: Diagnosis not present

## 2018-11-18 DIAGNOSIS — D229 Melanocytic nevi, unspecified: Secondary | ICD-10-CM | POA: Diagnosis not present

## 2018-12-09 ENCOUNTER — Other Ambulatory Visit: Payer: Self-pay | Admitting: Family Medicine

## 2018-12-09 DIAGNOSIS — E785 Hyperlipidemia, unspecified: Secondary | ICD-10-CM

## 2018-12-09 DIAGNOSIS — I10 Essential (primary) hypertension: Secondary | ICD-10-CM

## 2018-12-09 NOTE — Telephone Encounter (Signed)
Requested medication (s) are due for refill today: yes  Requested medication (s) are on the active medication list: yes  Last refill:  Both 06/13/18 for 90 tabs and 1 refill for each  Future visit scheduled: no  Notes to clinic:  Per chart, needs b/p check.  Cardiovascular: ACE inhibitors failed.  Requested Prescriptions  Pending Prescriptions Disp Refills   lisinopril (PRINIVIL,ZESTRIL) 10 MG tablet [Pharmacy Med Name: LISINOPRIL 10 MG TABLET] 90 tablet 0    Sig: TAKE ONE TABLET BY MOUTH DAILY     Cardiovascular:  ACE Inhibitors Failed - 12/09/2018  6:00 AM      Failed - Last BP in normal range    BP Readings from Last 1 Encounters:  06/21/18 (!) 177/94         Passed - Cr in normal range and within 180 days    Creat  Date Value Ref Range Status  06/08/2016 0.72 0.70 - 1.11 mg/dL Final    Comment:      For patients > or = 82 years of age: The upper reference limit for Creatinine is approximately 13% higher for people identified as African-American.      Creatinine, Ser  Date Value Ref Range Status  06/21/2018 0.80 0.61 - 1.24 mg/dL Final         Passed - K in normal range and within 180 days    Potassium  Date Value Ref Range Status  06/21/2018 3.7 3.5 - 5.1 mmol/L Final         Passed - Patient is not pregnant      Passed - Valid encounter within last 6 months    Recent Outpatient Visits          5 months ago Fall in home, initial encounter   [facility], Ranell Patrick, MD   1 year ago Hyperlipidemia, unspecified hyperlipidemia type   [facility], Ranell Patrick, MD   2 years ago Allergic rhinitis, unspecified chronicity, unspecified seasonality, unspecified trigger   [facility], Ranell Patrick, MD   2 years ago Prediabetes   [facility], Ranell Patrick, MD   3 years ago Essential hypertension   [facility], Ranell Patrick, MD            atorvastatin (LIPITOR) 10 MG tablet [Pharmacy  Med Name: ATORVASTATIN 10 MG TABLET] 90 tablet 0    Sig: TAKE ONE TABLET BY MOUTH DAILY     Cardiovascular:  Antilipid - Statins Passed - 12/09/2018  6:00 AM      Passed - Total Cholesterol in normal range and within 360 days    Cholesterol, Total  Date Value Ref Range Status  06/13/2018 144 100 - 199 mg/dL Final         Passed - LDL in normal range and within 360 days    LDL Calculated  Date Value Ref Range Status  06/13/2018 70 0 - 99 mg/dL Final         Passed - HDL in normal range and within 360 days    HDL  Date Value Ref Range Status  06/13/2018 56 >39 mg/dL Final         Passed - Triglycerides in normal range and within 360 days    Triglycerides  Date Value Ref Range Status  06/13/2018 91 0 - 149 mg/dL Final         Passed - Patient is not pregnant  Passed - Valid encounter within last 12 months    Recent Outpatient Visits          5 months ago Fall in home, initial encounter   [facility], Ranell Patrick, MD   1 year ago Hyperlipidemia, unspecified hyperlipidemia type   [facility], Ranell Patrick, MD   2 years ago Allergic rhinitis, unspecified chronicity, unspecified seasonality, unspecified trigger   [facility], Ranell Patrick, MD   2 years ago Prediabetes   [facility], Ranell Patrick, MD   3 years ago Essential hypertension   [facility], Ranell Patrick, MD

## 2019-03-08 ENCOUNTER — Other Ambulatory Visit: Payer: Self-pay | Admitting: Family Medicine

## 2019-03-08 DIAGNOSIS — E785 Hyperlipidemia, unspecified: Secondary | ICD-10-CM

## 2019-03-08 DIAGNOSIS — I10 Essential (primary) hypertension: Secondary | ICD-10-CM

## 2019-03-10 ENCOUNTER — Telehealth: Payer: Self-pay | Admitting: Family Medicine

## 2019-03-10 ENCOUNTER — Ambulatory Visit (INDEPENDENT_AMBULATORY_CARE_PROVIDER_SITE_OTHER): Payer: Medicare Other | Admitting: Family Medicine

## 2019-03-10 ENCOUNTER — Other Ambulatory Visit: Payer: Self-pay

## 2019-03-10 ENCOUNTER — Encounter: Payer: Self-pay | Admitting: Family Medicine

## 2019-03-10 VITALS — BP 136/82 | HR 91 | Temp 98.5°F | Resp 14 | Ht 66.5 in | Wt 164.0 lb

## 2019-03-10 DIAGNOSIS — I10 Essential (primary) hypertension: Secondary | ICD-10-CM | POA: Diagnosis not present

## 2019-03-10 DIAGNOSIS — E785 Hyperlipidemia, unspecified: Secondary | ICD-10-CM | POA: Diagnosis not present

## 2019-03-10 DIAGNOSIS — J309 Allergic rhinitis, unspecified: Secondary | ICD-10-CM | POA: Diagnosis not present

## 2019-03-10 DIAGNOSIS — R7303 Prediabetes: Secondary | ICD-10-CM

## 2019-03-10 MED ORDER — AZELASTINE HCL 0.15 % NA SOLN: NASAL | 6 refills | Status: DC

## 2019-03-10 MED ORDER — ATORVASTATIN CALCIUM 10 MG PO TABS: 10.0000 mg | ORAL_TABLET | Freq: Every day | ORAL | 2 refills | Status: DC

## 2019-03-10 MED ORDER — LISINOPRIL 10 MG PO TABS: 10.0000 mg | ORAL_TABLET | Freq: Every day | ORAL | 2 refills | Status: DC

## 2019-03-10 NOTE — Patient Instructions (Addendum)
No change in meds for now.  Follow-up in 6 months for a wellness exam.  Avoid crowds, handwashing as we discussed during this time.  Let me know if there are questions.   If you have lab work done today you will be contacted with your lab results within the next 2 weeks.  If you have not heard from [place] then please contact us. The fastest way to get your results is to register for My Chart.   IF you received an x-ray today, you will receive an invoice from [facility]. Please contact [facility] at [phone] with questions or concerns regarding your invoice.   IF you received labwork today, you will receive an invoice from [facility]. Please contact [facility] at [phone] with questions or concerns regarding your invoice.   Our billing staff will not be able to assist you with questions regarding bills from these companies.  You will be contacted with the lab results as soon as they are available. The fastest way to get your results is to activate your My Chart account. Instructions are located on the last page of this paperwork. If you have not heard from [place] regarding the results in 2 weeks, please contact this office.

## 2019-03-10 NOTE — Telephone Encounter (Signed)
Attempted to call patient to schedule an appointment for refills. No answer, left message to call office and schedule an appointment.

## 2019-03-10 NOTE — Progress Notes (Signed)
Subjective:    Patient ID: Nicholas Finley, male    DOB: 05/08/1926, 85 y.o.   MRN: [id number]  HPI Nicholas Finley is a 85 y.o. male Presents today for: Chief Complaint  Patient presents with  . Hypertension    need refill on medication  . Hyperlipidemia    need refills on medication  . Allergic Rhinitis     need a refill on nasal spray   Hypertension: BP Readings from Last 3 Encounters:  03/10/19 136/82  06/21/18 (!) 177/94  06/13/18 (!) 156/86   Lab Results  Component Value Date   CREATININE 0.80 06/21/2018  lisinopril 10mg  qd. Higher readings after wrist fx.  No new side effects.  no new cp, palpitations.   Prediabetes: Lab Results  Component Value Date   HGBA1C 5.8 (H) 06/13/2018    Hyperlipidemia:  Lab Results  Component Value Date   CHOL 144 06/13/2018   HDL 56 06/13/2018   LDLCALC 70 06/13/2018   TRIG 91 06/13/2018   CHOLHDL 2.6 06/13/2018   Lab Results  Component Value Date   ALT 18 06/13/2018   AST 19 06/13/2018   ALKPHOS 62 06/13/2018   BILITOT 1.3 (H) 06/13/2018  on lipitor, no new myalgias.   All rhiniitis:  Uses Astepro ns.    Patient Active Problem List   Diagnosis Date Noted  . HTN (hypertension) 03/01/2012  . Hyperlipidemia 03/01/2012  . Hearing difficulty 03/01/2012  . Allergic rhinitis 03/01/2012   Past Medical History:  Diagnosis Date  . Allergy   . Cataract   . Enlarged prostate   . Heart murmur   . Hypercholesteremia   . Hypertension    Past Surgical History:  Procedure Laterality Date  . CYST EXCISION     pilonidal  . SKIN CANCER EXCISION     No Known Allergies Prior to Admission medications   Medication Sig Start Date End Date Taking? Authorizing Provider  aspirin 81 MG tablet Take 81 mg by mouth daily.   Yes [provider]  atorvastatin (LIPITOR) 10 MG tablet TAKE ONE TABLET BY MOUTH DAILY 12/11/18  Yes [name], MD  Azelastine HCl (ASTEPRO) 0.15 % SOLN instill 1 spray into each  nostril twice a day if needed 12/07/16  Yes [name], MD  lisinopril (PRINIVIL,ZESTRIL) 10 MG tablet TAKE ONE TABLET BY MOUTH DAILY 12/11/18  Yes [name], MD   Social History   Socioeconomic History  . Marital status: Single    Spouse name: Not on file  . Number of children: 0  . Years of education: 54  . Highest education level: Not on file  Occupational History    Comment: retired  Scientific laboratory technician  . Financial resource strain: Not on file  . Food insecurity:    Worry: Not on file    Inability: Not on file  . Transportation needs:    Medical: Not on file    Non-medical: Not on file  Tobacco Use  . Smoking status: Current Some Day Smoker    Types: Cigarettes, Pipe, Cigars  . Smokeless tobacco: Never Used  . Tobacco comment: cigars   Substance and Sexual Activity  . Alcohol use: Yes    Alcohol/week: 0.0 standard drinks    Comment: occas beer  . Drug use: No  . Sexual activity: Not on file  Lifestyle  . Physical activity:    Days per week: Not on file    Minutes per session: Not on file  . Stress: Not on  file  Relationships  . Social connections:    Talks on phone: Not on file    Gets together: Not on file    Attends religious service: Not on file    Active member of club or organization: Not on file    Attends meetings of clubs or organizations: Not on file    Relationship status: Not on file  . Intimate partner violence:    Fear of current or ex partner: Not on file    Emotionally abused: Not on file    Physically abused: Not on file    Forced sexual activity: Not on file  Other Topics Concern  . Not on file  Social History Narrative   Patient is right handed, resides alone, consumes caffeine twice daily    Review of Systems  Constitutional: Negative for fatigue and unexpected weight change.  Eyes: Negative for visual disturbance.  Respiratory: Negative for cough, chest tightness and shortness of breath.   Cardiovascular: Negative for chest  pain, palpitations and leg swelling.  Gastrointestinal: Negative for abdominal pain and blood in stool.  Neurological: Negative for dizziness, light-headedness and headaches.   Per HPI.      Objective:   Physical Exam Vitals signs reviewed.  Constitutional:      Appearance: He is well-developed.  HENT:     Head: Normocephalic and atraumatic.  Eyes:     Pupils: Pupils are equal, round, and reactive to light.  Neck:     Vascular: No carotid bruit or JVD.  Cardiovascular:     Rate and Rhythm: Normal rate and regular rhythm.     Heart sounds: Normal heart sounds. No murmur.  Pulmonary:     Effort: Pulmonary effort is normal.     Breath sounds: Normal breath sounds. No rales.  Skin:    General: Skin is warm and dry.  Neurological:     Mental Status: He is alert and oriented to person, place, and time.    Vitals:   03/10/19 1329  BP: 136/82  Pulse: 91  Resp: 14  Temp: 98.5 F (36.9 C)  TempSrc: Oral  SpO2: 96%  Weight: 164 lb (74.4 kg)  Height: 5' 6.5" (1.689 m)       Assessment & Plan:   Nicholas Finley is a 85 y.o. male Prediabetes - Plan: Hemoglobin A1c  -Check A1c.  Continue to monitor diet.  Hyperlipidemia, unspecified hyperlipidemia type - Plan: Lipid Panel, atorvastatin (LIPITOR) 10 MG tablet, DISCONTINUED: atorvastatin (LIPITOR) 10 MG tablet  -Risk and benefits of statins were discussed, option of discontinuing statin at some point if you would like, but tolerating at this time.  Check lipids, continue same  Allergic rhinitis - Plan: Azelastine HCl (ASTEPRO) 0.15 % SOLN  -Stable with Astepro, refilled.  Essential hypertension - Plan: lisinopril (PRINIVIL,ZESTRIL) 10 MG tablet, Comprehensive metabolic panel   Stable, tolerating current regimen. Medications refilled. Labs pending as above.    Meds ordered this encounter  Medications  . DISCONTD: atorvastatin (LIPITOR) 10 MG tablet    Sig: Take 1 tablet (10 mg total) by mouth daily.    Dispense:  90  tablet    Refill:  2    Office visit needed  . Azelastine HCl (ASTEPRO) 0.15 % SOLN    Sig: instill 1 spray into each nostril twice a day if needed    Dispense:  30 mL    Refill:  6  . lisinopril (PRINIVIL,ZESTRIL) 10 MG tablet    Sig: Take 1 tablet (10 mg total)  by mouth daily.    Dispense:  90 tablet    Refill:  2  . atorvastatin (LIPITOR) 10 MG tablet    Sig: Take 1 tablet (10 mg total) by mouth daily.    Dispense:  90 tablet    Refill:  2   Patient Instructions   No change in meds for now.  Follow-up in 6 months for a wellness exam.  Avoid crowds, handwashing as we discussed during this time.  Let me know if there are questions.   If you have lab work done today you will be contacted with your lab results within the next 2 weeks.  If you have not heard from [place] then please contact us. The fastest way to get your results is to register for My Chart.   IF you received an x-ray today, you will receive an invoice from [facility]. Please contact [facility] at [phone] with questions or concerns regarding your invoice.   IF you received labwork today, you will receive an invoice from [facility]. Please contact [facility] at [phone] with questions or concerns regarding your invoice.   Our billing staff will not be able to assist you with questions regarding bills from these companies.  You will be contacted with the lab results as soon as they are available. The fastest way to get your results is to activate your My Chart account. Instructions are located on the last page of this paperwork. If you have not heard from [place] regarding the results in 2 weeks, please contact this office.       Signed,   Merri Ray, MD [facility].  03/12/19 8:55 PM

## 2019-03-10 NOTE — Telephone Encounter (Signed)
[phone] Needs AWV for September

## 2019-03-11 LAB — COMPREHENSIVE METABOLIC PANEL
ALBUMIN: 4.2 g/dL (ref 3.5–4.6)
ALT: 16 IU/L (ref 0–44)
AST: 21 IU/L (ref 0–40)
Albumin/Globulin Ratio: 1.7 (ref 1.2–2.2)
Alkaline Phosphatase: 55 IU/L (ref 39–117)
BUN/Creatinine Ratio: 24 (ref 10–24)
BUN: 18 mg/dL (ref 10–36)
Bilirubin Total: 0.5 mg/dL (ref 0.0–1.2)
CALCIUM: 9.1 mg/dL (ref 8.6–10.2)
CO2: 21 mmol/L (ref 20–29)
CREATININE: 0.76 mg/dL (ref 0.76–1.27)
Chloride: 105 mmol/L (ref 96–106)
GFR, EST AFRICAN AMERICAN: 93 mL/min/{1.73_m2} (ref >59)
GFR, EST NON AFRICAN AMERICAN: 80 mL/min/{1.73_m2} (ref >59)
GLOBULIN, TOTAL: 2.5 g/dL (ref 1.5–4.5)
GLUCOSE: 82 mg/dL (ref 65–99)
Potassium: 4.5 mmol/L (ref 3.5–5.2)
Sodium: 142 mmol/L (ref 134–144)
TOTAL PROTEIN: 6.7 g/dL (ref 6.0–8.5)

## 2019-03-11 LAB — HEMOGLOBIN A1C
Est. average glucose Bld gHb Est-mCnc: 120 mg/dL
HEMOGLOBIN A1C: 5.8 % — AB (ref 4.8–5.6)

## 2019-03-11 LAB — LIPID PANEL
CHOLESTEROL TOTAL: 145 mg/dL (ref 100–199)
Chol/HDL Ratio: 2.8 ratio (ref 0.0–5.0)
HDL: 52 mg/dL (ref >39)
LDL Calculated: 69 mg/dL (ref 0–99)
Triglycerides: 118 mg/dL (ref 0–149)
VLDL CHOLESTEROL CAL: 24 mg/dL (ref 5–40)

## 2019-03-19 ENCOUNTER — Encounter: Payer: Self-pay | Admitting: Radiology

## 2019-03-24 ENCOUNTER — Telehealth: Payer: Self-pay | Admitting: *Deleted

## 2019-03-24 NOTE — Telephone Encounter (Signed)
Left message to schedule AWV 

## 2019-03-25 NOTE — Telephone Encounter (Signed)
Pt called and left message on [facility] voicemail box 03/24/2019 stating he was returning a call.

## 2019-03-25 NOTE — Telephone Encounter (Signed)
Pt called and left another VM that he is returning julie call. Please advise

## 2019-03-25 NOTE — Telephone Encounter (Signed)
Please call pt

## 2019-03-31 DIAGNOSIS — L600 Ingrowing nail: Secondary | ICD-10-CM | POA: Diagnosis not present

## 2019-03-31 DIAGNOSIS — M2042 Other hammer toe(s) (acquired), left foot: Secondary | ICD-10-CM | POA: Diagnosis not present

## 2019-03-31 DIAGNOSIS — B079 Viral wart, unspecified: Secondary | ICD-10-CM | POA: Diagnosis not present

## 2019-03-31 DIAGNOSIS — M79672 Pain in left foot: Secondary | ICD-10-CM | POA: Diagnosis not present

## 2019-03-31 DIAGNOSIS — M79671 Pain in right foot: Secondary | ICD-10-CM | POA: Diagnosis not present

## 2019-03-31 DIAGNOSIS — B351 Tinea unguium: Secondary | ICD-10-CM | POA: Diagnosis not present

## 2019-04-10 ENCOUNTER — Ambulatory Visit: Payer: Self-pay

## 2019-04-14 DIAGNOSIS — B079 Viral wart, unspecified: Secondary | ICD-10-CM | POA: Diagnosis not present

## 2019-04-28 DIAGNOSIS — B351 Tinea unguium: Secondary | ICD-10-CM | POA: Diagnosis not present

## 2019-04-28 DIAGNOSIS — M79674 Pain in right toe(s): Secondary | ICD-10-CM | POA: Diagnosis not present

## 2019-04-28 DIAGNOSIS — L600 Ingrowing nail: Secondary | ICD-10-CM | POA: Diagnosis not present

## 2019-04-28 DIAGNOSIS — M79675 Pain in left toe(s): Secondary | ICD-10-CM | POA: Diagnosis not present

## 2019-05-26 DIAGNOSIS — L600 Ingrowing nail: Secondary | ICD-10-CM | POA: Diagnosis not present

## 2019-05-26 DIAGNOSIS — B351 Tinea unguium: Secondary | ICD-10-CM | POA: Diagnosis not present

## 2019-05-26 DIAGNOSIS — M79675 Pain in left toe(s): Secondary | ICD-10-CM | POA: Diagnosis not present

## 2019-05-26 DIAGNOSIS — M79674 Pain in right toe(s): Secondary | ICD-10-CM | POA: Diagnosis not present

## 2019-06-03 DIAGNOSIS — D229 Melanocytic nevi, unspecified: Secondary | ICD-10-CM | POA: Diagnosis not present

## 2019-06-03 DIAGNOSIS — L821 Other seborrheic keratosis: Secondary | ICD-10-CM | POA: Diagnosis not present

## 2019-06-03 DIAGNOSIS — L814 Other melanin hyperpigmentation: Secondary | ICD-10-CM | POA: Diagnosis not present

## 2019-06-03 DIAGNOSIS — L570 Actinic keratosis: Secondary | ICD-10-CM | POA: Diagnosis not present

## 2019-06-23 DIAGNOSIS — B351 Tinea unguium: Secondary | ICD-10-CM | POA: Diagnosis not present

## 2019-06-23 DIAGNOSIS — L600 Ingrowing nail: Secondary | ICD-10-CM | POA: Diagnosis not present

## 2019-06-23 DIAGNOSIS — M79674 Pain in right toe(s): Secondary | ICD-10-CM | POA: Diagnosis not present

## 2019-06-23 DIAGNOSIS — M79675 Pain in left toe(s): Secondary | ICD-10-CM | POA: Diagnosis not present

## 2019-07-28 ENCOUNTER — Ambulatory Visit (INDEPENDENT_AMBULATORY_CARE_PROVIDER_SITE_OTHER): Payer: Medicare Other | Admitting: Family Medicine

## 2019-07-28 VITALS — BP 136/78 | Temp 98.6°F | Resp 16 | Ht 66.5 in | Wt 159.6 lb

## 2019-07-28 DIAGNOSIS — Z0000 Encounter for general adult medical examination without abnormal findings: Secondary | ICD-10-CM | POA: Diagnosis not present

## 2019-07-28 NOTE — Progress Notes (Signed)
Presents today for [employer] Visit   Date of last exam:03/09/2018  Interpreter used for this visit?no  Patient was seen in clinic at [place] at [facility]  Patient Care Team: [name], MD as PCP - General (Family Medicine)   Other items to address today:   Discussed immuniation TDAP declined due to insurance Discussed Eye/Dental Patient will document BP readings and call if they are getting high discussed were readings should be.  Appointment scheduled for 10.2020 with Dr. Carlota Raspberry for BP follow up    Other Screening: Last screening for diabetes: 03/10/2019 Last lipid screening: 03/10/2019  ADVANCE DIRECTIVES: Discussed: yes On File: no copy requested Materials Provided: no  Immunization status:  Immunization History  Administered Date(s) Administered  . Influenza,inj,Quad PF,6+ Mos 09/05/2013, 11/30/2014, 12/06/2015  . Influenza-Unspecified 09/26/2016  . Pneumococcal Conjugate-13 11/30/2014  . Pneumococcal-Unspecified 07/26/2007     Health Maintenance Due  Topic Date Due  . TETANUS/TDAP  03/19/1945  . INFLUENZA VACCINE  07/26/2019     Functional Status Survey: Is the patient deaf or have difficulty hearing?: Yes(trouble hearing from age.) Does the patient have difficulty seeing, even when wearing glasses/contacts?: No Does the patient have difficulty concentrating, remembering, or making decisions?: No Does the patient have difficulty walking or climbing stairs?: No Does the patient have difficulty dressing or bathing?: No Does the patient have difficulty doing errands alone such as visiting a doctor's office or shopping?: No   6CIT Screen 07/28/2019  What Year? 0 points  What month? 0 points  What time? 0 points  Count back from 20 0 points  Months in reverse 0 points  Repeat phrase 0 points  Total Score 0         Home Environment:   Lives in a one story home alone Can get up in 30 seconds from chair No scattered  rugs No grab bars Adequate lighting    Patient Active Problem List   Diagnosis Date Noted  . HTN (hypertension) 03/01/2012  . Hyperlipidemia 03/01/2012  . Hearing difficulty 03/01/2012  . Allergic rhinitis 03/01/2012     Past Medical History:  Diagnosis Date  . Allergy   . Cataract   . Enlarged prostate   . Heart murmur   . Hypercholesteremia   . Hypertension      Past Surgical History:  Procedure Laterality Date  . CYST EXCISION     pilonidal  . SKIN CANCER EXCISION       Family History  Problem Relation Age of Onset  . Heart failure Father   . Heart Problems Sister      Social History   Socioeconomic History  . Marital status: Single    Spouse name: Not on file  . Number of children: 0  . Years of education: 20  . Highest education level: Not on file  Occupational History    Comment: retired  Scientific laboratory technician  . Financial resource strain: Not on file  . Food insecurity    Worry: Not on file    Inability: Not on file  . Transportation needs    Medical: Not on file    Non-medical: Not on file  Tobacco Use  . Smoking status: Current Some Day Smoker    Types: Cigarettes, Pipe, Cigars  . Smokeless tobacco: Never Used  . Tobacco comment: cigars   Substance and Sexual Activity  . Alcohol use: Yes    Alcohol/week: 0.0 standard drinks    Comment: occas beer  .  Drug use: No  . Sexual activity: Not on file  Lifestyle  . Physical activity    Days per week: Not on file    Minutes per session: Not on file  . Stress: Not on file  Relationships  . Social Herbalist on phone: Not on file    Gets together: Not on file    Attends religious service: Not on file    Active member of club or organization: Not on file    Attends meetings of clubs or organizations: Not on file    Relationship status: Not on file  . Intimate partner violence    Fear of current or ex partner: Not on file    Emotionally abused: Not on file    Physically abused: Not on  file    Forced sexual activity: Not on file  Other Topics Concern  . Not on file  Social History Narrative   Patient is right handed, resides alone, consumes caffeine twice daily     No Known Allergies   Prior to Admission medications   Medication Sig Start Date End Date Taking? Authorizing Provider  aspirin 81 MG tablet Take 81 mg by mouth daily.   Yes [provider]  atorvastatin (LIPITOR) 10 MG tablet Take 1 tablet (10 mg total) by mouth daily. 03/10/19  Yes [name], MD  lisinopril (PRINIVIL,ZESTRIL) 10 MG tablet Take 1 tablet (10 mg total) by mouth daily. 03/10/19  Yes [name], MD  Azelastine HCl (ASTEPRO) 0.15 % SOLN instill 1 spray into each nostril twice a day if needed Patient not taking: Reported on 07/28/2019 03/10/19   [name], MD     Depression screen [facility] 2/9 07/28/2019 03/10/2019 06/13/2018 08/07/2017 12/07/2016  Decreased Interest 0 0 0 0 0  Down, Depressed, Hopeless 0 0 0 0 0  PHQ - 2 Score 0 0 0 0 0     Fall Risk  07/28/2019 03/10/2019 03/10/2019 06/13/2018 08/07/2017  Falls in the past year? 0 1 0 Yes No  Comment - June 14, 2019 - - -  Number falls in past yr: 0 0 0 1 -  Injury with Fall? 0 (No Data) 0 Yes -  Comment - head abrasion and cut on arm - - -  Risk Factor Category  - - - High Fall Risk -  Follow up - Falls evaluation completed Falls evaluation completed Falls prevention discussed -      PHYSICAL EXAM: BP 136/78 (BP Location: Right Arm, Patient Position: Sitting, Cuff Size: Normal)   Temp 98.6 F (37 C) (Oral)   Resp 16   Ht 5' 6.5" (1.689 m)   Wt 159 lb 9.6 oz (72.4 kg)   SpO2 98%   BMI 25.37 kg/m    Wt Readings from Last 3 Encounters:  07/28/19 159 lb 9.6 oz (72.4 kg)  03/10/19 164 lb (74.4 kg)  06/21/18 159 lb (72.1 kg)     No exam data present    Physical Exam   Education/Counseling provided regarding diet and exercise, prevention of chronic diseases, smoking/tobacco cessation, if applicable, and  reviewed "Covered Medicare Preventive Services."

## 2019-07-28 NOTE — Patient Instructions (Addendum)
Thank you for taking time to come for your Medicare Wellness Visit. I appreciate your ongoing commitment to your health goals. Please review the following plan we discussed and let me know if I can assist you in the future.  Dyann Goodspeed LPN  Preventive Care 65 Years and Older, Male Preventive care refers to lifestyle choices and visits with your health care provider that can promote health and wellness. This includes:  A yearly physical exam. This is also called an annual well check.  Regular dental and eye exams.  Immunizations.  Screening for certain conditions.  Healthy lifestyle choices, such as diet and exercise. What can I expect for my preventive care visit? Physical exam Your health care provider will check:  Height and weight. These may be used to calculate body mass index (BMI), which is a measurement that tells if you are at a healthy weight.  Heart rate and blood pressure.  Your skin for abnormal spots. Counseling Your health care provider may ask you questions about:  Alcohol, tobacco, and drug use.  Emotional well-being.  Home and relationship well-being.  Sexual activity.  Eating habits.  History of falls.  Memory and ability to understand (cognition).  Work and work environment. What immunizations do I need?  Influenza (flu) vaccine  This is recommended every year. Tetanus, diphtheria, and pertussis (Tdap) vaccine  You may need a Td booster every 10 years. Varicella (chickenpox) vaccine  You may need this vaccine if you have not already been vaccinated. Zoster (shingles) vaccine  You may need this after age 60. Pneumococcal conjugate (PCV13) vaccine  One dose is recommended after age 65. Pneumococcal polysaccharide (PPSV23) vaccine  One dose is recommended after age 65. Measles, mumps, and rubella (MMR) vaccine  You may need at least one dose of MMR if you were born in 1957 or later. You may also need a second dose. Meningococcal  conjugate (MenACWY) vaccine  You may need this if you have certain conditions. Hepatitis A vaccine  You may need this if you have certain conditions or if you travel or work in places where you may be exposed to hepatitis A. Hepatitis B vaccine  You may need this if you have certain conditions or if you travel or work in places where you may be exposed to hepatitis B. Haemophilus influenzae type b (Hib) vaccine  You may need this if you have certain conditions. You may receive vaccines as individual doses or as more than one vaccine together in one shot (combination vaccines). Talk with your health care provider about the risks and benefits of combination vaccines. What tests do I need? Blood tests  Lipid and cholesterol levels. These may be checked every 5 years, or more frequently depending on your overall health.  Hepatitis C test.  Hepatitis B test. Screening  Lung cancer screening. You may have this screening every year starting at age 55 if you have a 30-pack-year history of smoking and currently smoke or have quit within the past 15 years.  Colorectal cancer screening. All adults should have this screening starting at age 50 and continuing until age 75. Your health care provider may recommend screening at age 45 if you are at increased risk. You will have tests every 1-10 years, depending on your results and the type of screening test.  Prostate cancer screening. Recommendations will vary depending on your family history and other risks.  Diabetes screening. This is done by checking your blood sugar (glucose) after you have not eaten for   a while (fasting). You may have this done every 1-3 years.  Abdominal aortic aneurysm (AAA) screening. You may need this if you are a current or former smoker.  Sexually transmitted disease (STD) testing. Follow these instructions at home: Eating and drinking  Eat a diet that includes fresh fruits and vegetables, whole grains, lean  protein, and low-fat dairy products. Limit your intake of foods with high amounts of sugar, saturated fats, and salt.  Take vitamin and mineral supplements as recommended by your health care provider.  Do not drink alcohol if your health care provider tells you not to drink.  If you drink alcohol: ? Limit how much you have to 0-2 drinks a day. ? Be aware of how much alcohol is in your drink. In the [place] one drink equals one 12 oz bottle of beer (355 mL), one 5 oz glass of wine (148 mL), or one 1 oz glass of hard liquor (44 mL). Lifestyle  Take daily care of your teeth and gums.  Stay active. Exercise for at least 30 minutes on 5 or more days each week.  Do not use any products that contain nicotine or tobacco, such as cigarettes, e-cigarettes, and chewing tobacco. If you need help quitting, ask your health care provider.  If you are sexually active, practice safe sex. Use a condom or other form of protection to prevent STIs (sexually transmitted infections).  Talk with your health care provider about taking a low-dose aspirin or statin. What's next?  Visit your health care provider once a year for a well check visit.  Ask your health care provider how often you should have your eyes and teeth checked.  Stay up to date on all vaccines. This information is not intended to replace advice given to you by your health care provider. Make sure you discuss any questions you have with your health care provider. Document Released: 01/07/2016 Document Revised: 12/05/2018 Document Reviewed: 12/05/2018 Elsevier Patient Education  2020 [employer].

## 2019-09-18 IMAGING — DX DG HAND COMPLETE 3+V*R*
3 series · 3 of 3 positions shown · non-contrast
Comparison: None

CLINICAL DATA: RIGHT wrist pain post fall, follow-up

EXAM:
RIGHT HAND - COMPLETE 3+ VIEW

[hand pa]
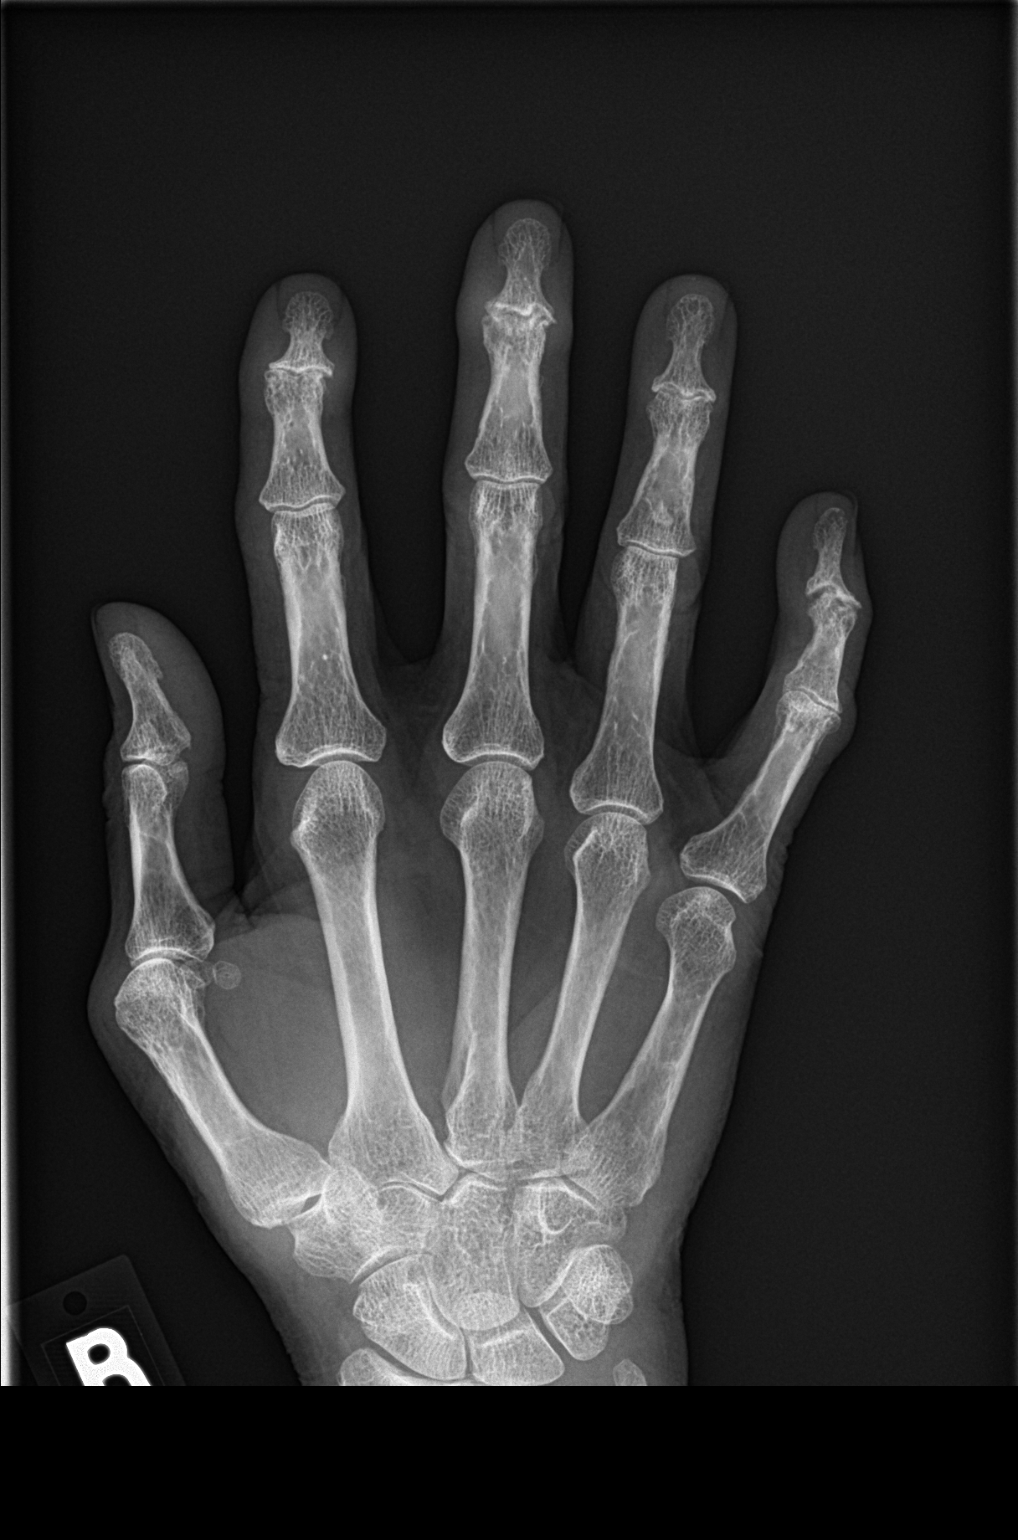

[hand obl]
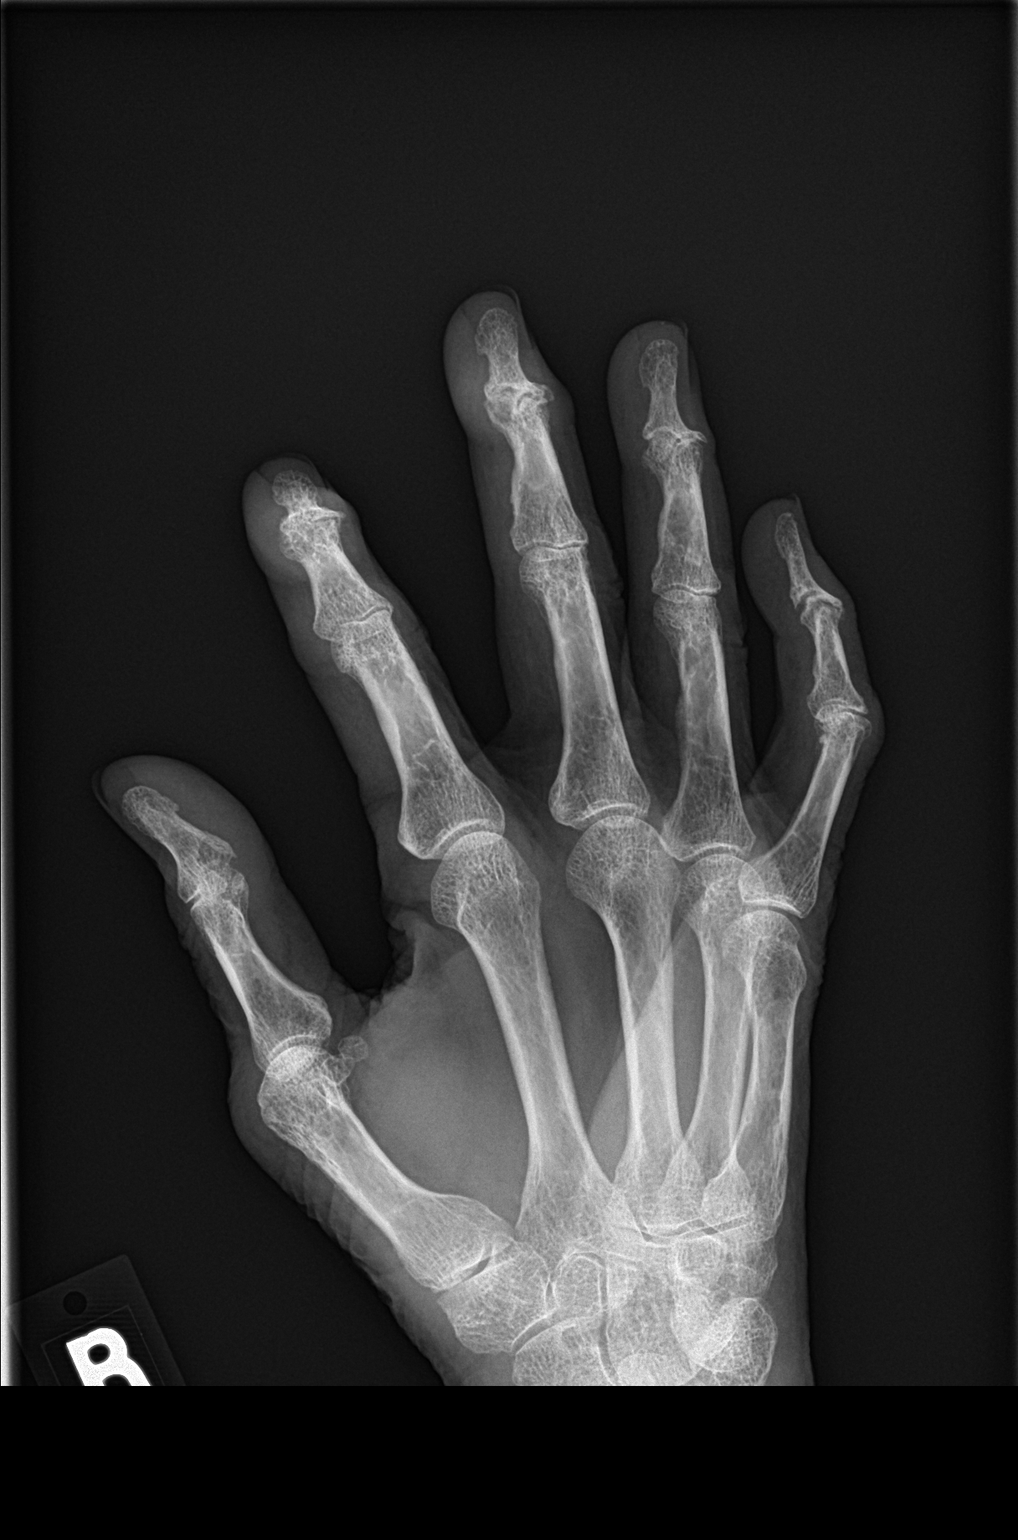

[hand lat]
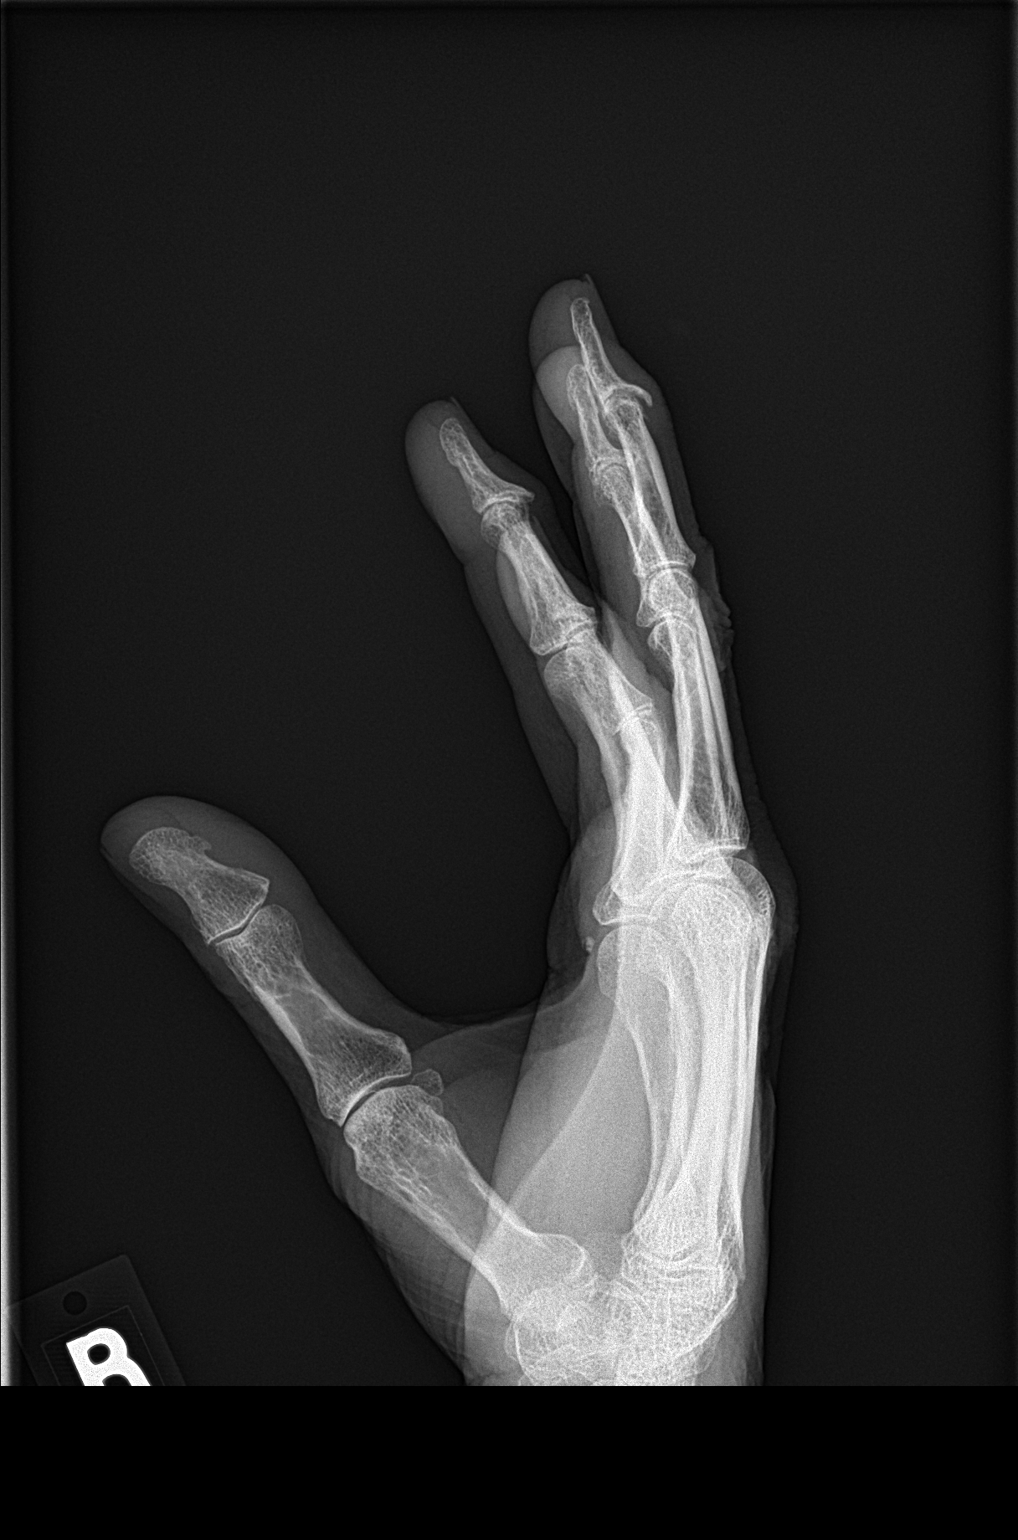

[3 of 3 positions shown; findings below may reference images not displayed]

FINDINGS: Osseous demineralization.

Intra-articular distal RIGHT radial metaphyseal fracture, better
visualized on wrist radiographs.

Degenerative changes with joint space narrowing and spur formation
at the DIP joints of the index through little fingers, pattern
suggesting erosive osteoarthritis.

Minimal narrowing of PIP joints.

Unusual appearance to the base of the third metacarpal though a
definite acute fracture plane is not visualized.

No additional fracture, dislocation or bone destruction.
IMPRESSION: Nondisplaced subacute intra-articular fracture at distal RIGHT
radial metaphysis.

No other definite acute bony abnormalities.

Suspected erosive osteoarthritic changes at the DIP joints of
multiple fingers.

## 2019-09-22 DIAGNOSIS — Z23 Encounter for immunization: Secondary | ICD-10-CM | POA: Diagnosis not present

## 2019-09-22 DIAGNOSIS — L600 Ingrowing nail: Secondary | ICD-10-CM | POA: Diagnosis not present

## 2019-09-22 DIAGNOSIS — M79674 Pain in right toe(s): Secondary | ICD-10-CM | POA: Diagnosis not present

## 2019-09-22 DIAGNOSIS — M79675 Pain in left toe(s): Secondary | ICD-10-CM | POA: Diagnosis not present

## 2019-09-29 ENCOUNTER — Other Ambulatory Visit: Payer: Self-pay

## 2019-09-29 ENCOUNTER — Ambulatory Visit (INDEPENDENT_AMBULATORY_CARE_PROVIDER_SITE_OTHER): Payer: Medicare Other | Admitting: Family Medicine

## 2019-09-29 ENCOUNTER — Encounter: Payer: Self-pay | Admitting: Family Medicine

## 2019-09-29 VITALS — BP 143/76 | HR 80 | Temp 98.6°F | Resp 14 | Wt 159.4 lb

## 2019-09-29 DIAGNOSIS — I10 Essential (primary) hypertension: Secondary | ICD-10-CM

## 2019-09-29 DIAGNOSIS — R32 Unspecified urinary incontinence: Secondary | ICD-10-CM | POA: Diagnosis not present

## 2019-09-29 DIAGNOSIS — R351 Nocturia: Secondary | ICD-10-CM | POA: Diagnosis not present

## 2019-09-29 MED ORDER — DOXAZOSIN MESYLATE 1 MG PO TABS: 1.0000 mg | ORAL_TABLET | Freq: Every day | ORAL | 1 refills | Status: DC

## 2019-09-29 NOTE — Patient Instructions (Addendum)
See information below on Kegel exercises to help control urination but I think some of your symptoms may be due to prostate.  I will check a prostate blood test as well as other electrolytes, start doxazosin 1 pill/day.  Watch for lightheadedness or dizziness on that medicine.Stop new med if that occurs.  Follow-up with me in 1 month, sooner if any worsening symptoms.  Kegel Exercises  Kegel exercises can help strengthen your pelvic floor muscles. The pelvic floor is a group of muscles that support your rectum, small intestine, and bladder. In females, pelvic floor muscles also help support the womb (uterus). These muscles help you control the flow of urine and stool. Kegel exercises are painless and simple, and they do not require any equipment. Your provider may suggest Kegel exercises to:  Improve bladder and bowel control.  Improve sexual response.  Improve weak pelvic floor muscles after surgery to remove the uterus (hysterectomy) or pregnancy (females).  Improve weak pelvic floor muscles after prostate gland removal or surgery (males). Kegel exercises involve squeezing your pelvic floor muscles, which are the same muscles you squeeze when you try to stop the flow of urine or keep from passing gas. The exercises can be done while sitting, standing, or lying down, but it is best to vary your position. Exercises How to do Kegel exercises: 1. Squeeze your pelvic floor muscles tight. You should feel a tight lift in your rectal area. If you are a male, you should also feel a tightness in your vaginal area. Keep your stomach, buttocks, and legs relaxed. 2. Hold the muscles tight for up to 10 seconds. 3. Breathe normally. 4. Relax your muscles. 5. Repeat as told by your health care provider. Repeat this exercise daily as told by your health care provider. Continue to do this exercise for at least 4-6 weeks, or for as long as told by your health care provider. You may be referred to a  physical therapist who can help you learn more about how to do Kegel exercises. Depending on your condition, your health care provider may recommend:  Varying how long you squeeze your muscles.  Doing several sets of exercises every day.  Doing exercises for several weeks.  Making Kegel exercises a part of your regular exercise routine. This information is not intended to replace advice given to you by your health care provider. Make sure you discuss any questions you have with your health care provider. Document Released: 11/27/2012 Document Revised: 07/31/2018 Document Reviewed: 07/31/2018 Elsevier Patient Education  [employer].  If you have lab work done today you will be contacted with your lab results within the next 2 weeks.  If you have not heard from [place] then please contact us. The fastest way to get your results is to register for My Chart.   IF you received an x-ray today, you will receive an invoice from [facility]. Please contact [facility] at [phone] with questions or concerns regarding your invoice.   IF you received labwork today, you will receive an invoice from [place]. Please contact [facility] at [phone] with questions or concerns regarding your invoice.   Our billing staff will not be able to assist you with questions regarding bills from these companies.  You will be contacted with the lab results as soon as they are available. The fastest way to get your results is to activate your My Chart account. Instructions are located on the last page of this paperwork. If you have not heard from  [place] regarding the results in 2 weeks, please contact this office.

## 2019-09-29 NOTE — Progress Notes (Signed)
Subjective:    Patient ID: Nicholas Finley, male    DOB: Sep 11, 1926, 85 y.o.   MRN: [id number]  HPI Nicholas Finley is a 85 y.o. male Presents today for: Chief Complaint  Patient presents with  . Blood Pressure Check    Would like to discuss getting on a bp medication   Hypertension: BP Readings from Last 3 Encounters:  09/29/19 (!) 156/78  07/28/19 136/78  03/10/19 136/82   Lab Results  Component Value Date   CREATININE 0.76 03/10/2019  Previously managed with lisinopril 10 mg daily - takes at night.  No missed doses, but sometimes takes in the morning if forgets night before.  No home readings. Rare dizziness initially if standing up quickly only.   Urinary incontinence.  Urge symptoms started 2-4 years ago. Trouble getting to the bathroom at times. Occurs few times per week. Sometimes leaks urine before getting to bathroom.  Nocturia: 2-4 times at least.  Last PSA 0.61 in 05/2015.  Has used absorbent pads.  No acute change in sx's.   Hyperlipidemia:  Lab Results  Component Value Date   CHOL 145 03/10/2019   HDL 52 03/10/2019   LDLCALC 69 03/10/2019   TRIG 118 03/10/2019   CHOLHDL 2.8 03/10/2019   Lab Results  Component Value Date   ALT 16 03/10/2019   AST 21 03/10/2019   ALKPHOS 55 03/10/2019   BILITOT 0.5 03/10/2019  Takes Lipitor 10 mg daily.  Stable in March. No new side effects.   Patient Active Problem List   Diagnosis Date Noted  . HTN (hypertension) 03/01/2012  . Hyperlipidemia 03/01/2012  . Hearing difficulty 03/01/2012  . Allergic rhinitis 03/01/2012   Past Medical History:  Diagnosis Date  . Allergy   . Cataract   . Enlarged prostate   . Heart murmur   . Hypercholesteremia   . Hypertension    Past Surgical History:  Procedure Laterality Date  . CYST EXCISION     pilonidal  . SKIN CANCER EXCISION     No Known Allergies Prior to Admission medications   Medication Sig Start Date End Date Taking? Authorizing Provider  aspirin  81 MG tablet Take 81 mg by mouth daily.   Yes [provider]  atorvastatin (LIPITOR) 10 MG tablet Take 1 tablet (10 mg total) by mouth daily. 03/10/19  Yes [name], MD  Azelastine HCl (ASTEPRO) 0.15 % SOLN instill 1 spray into each nostril twice a day if needed 03/10/19  Yes [name], MD  lisinopril (PRINIVIL,ZESTRIL) 10 MG tablet Take 1 tablet (10 mg total) by mouth daily. 03/10/19  Yes [name], MD  terbinafine (LAMISIL) 250 MG tablet Take 250 mg by mouth daily.   Yes [provider]   Social History   Socioeconomic History  . Marital status: Single    Spouse name: Not on file  . Number of children: 0  . Years of education: 62  . Highest education level: Not on file  Occupational History    Comment: retired  Scientific laboratory technician  . Financial resource strain: Not on file  . Food insecurity    Worry: Not on file    Inability: Not on file  . Transportation needs    Medical: Not on file    Non-medical: Not on file  Tobacco Use  . Smoking status: Current Some Day Smoker    Types: Cigarettes, Pipe, Cigars  . Smokeless tobacco: Never Used  . Tobacco comment: cigars   Substance and Sexual  Activity  . Alcohol use: Yes    Alcohol/week: 0.0 standard drinks    Comment: occas beer  . Drug use: No  . Sexual activity: Not on file  Lifestyle  . Physical activity    Days per week: Not on file    Minutes per session: Not on file  . Stress: Not on file  Relationships  . Social Herbalist on phone: Not on file    Gets together: Not on file    Attends religious service: Not on file    Active member of club or organization: Not on file    Attends meetings of clubs or organizations: Not on file    Relationship status: Not on file  . Intimate partner violence    Fear of current or ex partner: Not on file    Emotionally abused: Not on file    Physically abused: Not on file    Forced sexual activity: Not on file  Other Topics Concern  . Not  on file  Social History Narrative   Patient is right handed, resides alone, consumes caffeine twice daily    Review of Systems  Constitutional: Negative for fatigue and unexpected weight change.  Eyes: Negative for visual disturbance.  Respiratory: Negative for cough, chest tightness and shortness of breath.   Cardiovascular: Negative for chest pain, palpitations and leg swelling.  Gastrointestinal: Negative for abdominal pain and blood in stool.  Genitourinary: Positive for difficulty urinating. Negative for dysuria, flank pain and hematuria.  Neurological: Negative for dizziness, light-headedness (rare with standing quicklly) and headaches.       Objective:   Physical Exam Vitals signs reviewed.  Constitutional:      Appearance: He is well-developed.  HENT:     Head: Normocephalic and atraumatic.  Eyes:     Pupils: Pupils are equal, round, and reactive to light.  Neck:     Vascular: No carotid bruit or JVD.  Cardiovascular:     Rate and Rhythm: Normal rate and regular rhythm.     Heart sounds: Normal heart sounds. No murmur.  Pulmonary:     Effort: Pulmonary effort is normal.     Breath sounds: Normal breath sounds. No rales.  Abdominal:     General: There is no distension.     Tenderness: There is no abdominal tenderness. There is no right CVA tenderness or left CVA tenderness.  Skin:    General: Skin is warm and dry.  Neurological:     Mental Status: He is alert and oriented to person, place, and time.  Psychiatric:        Mood and Affect: Mood normal.        Behavior: Behavior normal.    Vitals:   09/29/19 1012 09/29/19 1054  BP: (!) 156/78 (!) 143/76  Pulse: 80   Resp: 14   Temp: 98.6 F (37 C)   TempSrc: Oral   SpO2: 97%   Weight: 159 lb 6.4 oz (72.3 kg)       Assessment & Plan:    Nicholas Finley is a 85 y.o. male Nocturia - Plan: PSA, doxazosin (CARDURA) 1 MG tablet Urinary incontinence, unspecified type - Plan: PSA  - likely prostate/BPH.  Denies acute changes. Check PSA, start doxazosin (should help with BP slightly as well), orthostatic  precautions/fall precautions discussed and RTC precautions.   - continue incontinence pads. kegel exercises discussed, recheck in 4-6 weeks.   Essential hypertension - Plan: Basic metabolic panel, doxazosin (CARDURA) 1 MG tablet  -  borderline elevated based on age. Continue lisinopril same dose, add doxazosin above with precautions and recheck in 4-6 weeks.   Meds ordered this encounter  Medications  . doxazosin (CARDURA) 1 MG tablet    Sig: Take 1 tablet (1 mg total) by mouth daily.    Dispense:  90 tablet    Refill:  1   Patient Instructions    See information below on Kegel exercises to help control urination but I think some of your symptoms may be due to prostate.  I will check a prostate blood test as well as other electrolytes, start doxazosin 1 pill/day.  Watch for lightheadedness or dizziness on that medicine.Stop new med if that occurs.  Follow-up with me in 1 month, sooner if any worsening symptoms.  Kegel Exercises  Kegel exercises can help strengthen your pelvic floor muscles. The pelvic floor is a group of muscles that support your rectum, small intestine, and bladder. In females, pelvic floor muscles also help support the womb (uterus). These muscles help you control the flow of urine and stool. Kegel exercises are painless and simple, and they do not require any equipment. Your provider may suggest Kegel exercises to:  Improve bladder and bowel control.  Improve sexual response.  Improve weak pelvic floor muscles after surgery to remove the uterus (hysterectomy) or pregnancy (females).  Improve weak pelvic floor muscles after prostate gland removal or surgery (males). Kegel exercises involve squeezing your pelvic floor muscles, which are the same muscles you squeeze when you try to stop the flow of urine or keep from passing gas. The exercises can be done while sitting,  standing, or lying down, but it is best to vary your position. Exercises How to do Kegel exercises: 1. Squeeze your pelvic floor muscles tight. You should feel a tight lift in your rectal area. If you are a male, you should also feel a tightness in your vaginal area. Keep your stomach, buttocks, and legs relaxed. 2. Hold the muscles tight for up to 10 seconds. 3. Breathe normally. 4. Relax your muscles. 5. Repeat as told by your health care provider. Repeat this exercise daily as told by your health care provider. Continue to do this exercise for at least 4-6 weeks, or for as long as told by your health care provider. You may be referred to a physical therapist who can help you learn more about how to do Kegel exercises. Depending on your condition, your health care provider may recommend:  Varying how long you squeeze your muscles.  Doing several sets of exercises every day.  Doing exercises for several weeks.  Making Kegel exercises a part of your regular exercise routine. This information is not intended to replace advice given to you by your health care provider. Make sure you discuss any questions you have with your health care provider. Document Released: 11/27/2012 Document Revised: 07/31/2018 Document Reviewed: 07/31/2018 Elsevier Patient Education  [employer].  If you have lab work done today you will be contacted with your lab results within the next 2 weeks.  If you have not heard from [place] then please contact us. The fastest way to get your results is to register for My Chart.   IF you received an x-ray today, you will receive an invoice from [facility]. Please contact [facility] at [phone] with questions or concerns regarding your invoice.   IF you received labwork today, you will receive an invoice from [facility]. Please contact [facility] at [phone] with questions or concerns  regarding your invoice.   Our billing staff will not be able  to assist you with questions regarding bills from these companies.  You will be contacted with the lab results as soon as they are available. The fastest way to get your results is to activate your My Chart account. Instructions are located on the last page of this paperwork. If you have not heard from [place] regarding the results in 2 weeks, please contact this office.       Signed,   Merri Ray, MD [facility].  09/29/19 12:55 PM

## 2019-09-30 LAB — BASIC METABOLIC PANEL
BUN/Creatinine Ratio: 18 (ref 10–24)
BUN: 12 mg/dL (ref 10–36)
CO2: 20 mmol/L (ref 20–29)
Calcium: 9.1 mg/dL (ref 8.6–10.2)
Chloride: 105 mmol/L (ref 96–106)
Creatinine, Ser: 0.67 mg/dL — ABNORMAL LOW (ref 0.76–1.27)
GFR calc Af Amer: 97 mL/min/{1.73_m2} (ref >59)
GFR calc non Af Amer: 84 mL/min/{1.73_m2} (ref >59)
Glucose: 98 mg/dL (ref 65–99)
Potassium: 4.1 mmol/L (ref 3.5–5.2)
Sodium: 142 mmol/L (ref 134–144)

## 2019-09-30 LAB — PSA: Prostate Specific Ag, Serum: 0.8 ng/mL (ref 0.0–4.0)

## 2019-10-07 ENCOUNTER — Encounter: Payer: Self-pay | Admitting: Radiology

## 2019-10-22 DIAGNOSIS — L600 Ingrowing nail: Secondary | ICD-10-CM | POA: Diagnosis not present

## 2019-10-22 DIAGNOSIS — B351 Tinea unguium: Secondary | ICD-10-CM | POA: Diagnosis not present

## 2019-10-22 DIAGNOSIS — M79674 Pain in right toe(s): Secondary | ICD-10-CM | POA: Diagnosis not present

## 2019-10-27 DIAGNOSIS — H0100A Unspecified blepharitis right eye, upper and lower eyelids: Secondary | ICD-10-CM | POA: Diagnosis not present

## 2019-10-27 DIAGNOSIS — H353132 Nonexudative age-related macular degeneration, bilateral, intermediate dry stage: Secondary | ICD-10-CM | POA: Diagnosis not present

## 2019-10-27 DIAGNOSIS — H524 Presbyopia: Secondary | ICD-10-CM | POA: Diagnosis not present

## 2019-10-27 DIAGNOSIS — H40053 Ocular hypertension, bilateral: Secondary | ICD-10-CM | POA: Diagnosis not present

## 2019-11-10 ENCOUNTER — Ambulatory Visit (INDEPENDENT_AMBULATORY_CARE_PROVIDER_SITE_OTHER): Payer: Medicare Other | Admitting: Family Medicine

## 2019-11-10 ENCOUNTER — Encounter: Payer: Self-pay | Admitting: Family Medicine

## 2019-11-10 ENCOUNTER — Other Ambulatory Visit: Payer: Self-pay

## 2019-11-10 VITALS — BP 138/72 | HR 78 | Temp 98.8°F | Wt 163.0 lb

## 2019-11-10 DIAGNOSIS — R32 Unspecified urinary incontinence: Secondary | ICD-10-CM

## 2019-11-10 DIAGNOSIS — I10 Essential (primary) hypertension: Secondary | ICD-10-CM | POA: Diagnosis not present

## 2019-11-10 DIAGNOSIS — R351 Nocturia: Secondary | ICD-10-CM

## 2019-11-10 NOTE — Patient Instructions (Addendum)
No change in medications for now.  Bring blood pressure meter with you to your next office visit to check accuracy.  Follow-up in 4 months, sooner if any new or worsening symptoms.    How to Take Your Blood Pressure You can take your blood pressure at home with a machine. You may need to check your blood pressure at home:  To check if you have high blood pressure (hypertension).  To check your blood pressure over time.  To make sure your blood pressure medicine is working. Supplies needed: You will need a blood pressure machine, or monitor. You can buy one at a drugstore or online. When choosing one:  Choose one with an arm cuff.  Choose one that wraps around your upper arm. Only one finger should fit between your arm and the cuff.  Do not choose one that measures your blood pressure from your wrist or finger. Your doctor can suggest a monitor. How to prepare Avoid these things for 30 minutes before checking your blood pressure:  Drinking caffeine.  Drinking alcohol.  Eating.  Smoking.  Exercising. Five minutes before checking your blood pressure:  Pee.  Sit in a dining chair. Avoid sitting in a soft couch or armchair.  Be quiet. Do not talk. How to take your blood pressure Follow the instructions that came with your machine. If you have a digital blood pressure monitor, these may be the instructions: 1. Sit up straight. 2. Place your feet on the floor. Do not cross your ankles or legs. 3. Rest your left arm at the level of your heart. You may rest it on a table, desk, or chair. 4. Pull up your shirt sleeve. 5. Wrap the blood pressure cuff around the upper part of your left arm. The cuff should be 1 inch (2.5 cm) above your elbow. It is best to wrap the cuff around bare skin. 6. Fit the cuff snugly around your arm. You should be able to place only one finger between the cuff and your arm. 7. Put the cord inside the groove of your elbow. 8. Press the power  button. 9. Sit quietly while the cuff fills with air and loses air. 10. Write down the numbers on the screen. 11. Wait 2-3 minutes and then repeat steps 1-10. What do the numbers mean? Two numbers make up your blood pressure. The first number is called systolic pressure. The second is called diastolic pressure. An example of a blood pressure reading is "120 over 80" (or 120/80). If you are an adult and do not have a medical condition, use this guide to find out if your blood pressure is normal: Normal  First number: below 120.  Second number: below 80. Elevated  First number: 120-129.  Second number: below 80. Hypertension stage 1  First number: 130-139.  Second number: 80-89. Hypertension stage 2  First number: 140 or above.  Second number: 61 or above. Your blood pressure is above normal even if only the top or bottom number is above normal. Follow these instructions at home:  Check your blood pressure as often as your doctor tells you to.  Take your monitor to your next doctor's appointment. Your doctor will: ? Make sure you are using it correctly. ? Make sure it is working right.  Make sure you understand what your blood pressure numbers should be.  Tell your doctor if your medicines are causing side effects. Contact a doctor if:  Your blood pressure keeps being high. Get help right  away if:  Your first blood pressure number is higher than 180.  Your second blood pressure number is higher than 120. This information is not intended to replace advice given to you by your health care provider. Make sure you discuss any questions you have with your health care provider. Document Released: 11/23/2008 Document Revised: 11/23/2017 Document Reviewed: 05/19/2016 Elsevier Patient Education  [employer].   If you have lab work done today you will be contacted with your lab results within the next 2 weeks.  If you have not heard from [place] then please contact us. The  fastest way to get your results is to register for My Chart.   IF you received an x-ray today, you will receive an invoice from [facility]. Please contact [facility] at [phone] with questions or concerns regarding your invoice.   IF you received labwork today, you will receive an invoice from [place]. Please contact [facility] at [phone] with questions or concerns regarding your invoice.   Our billing staff will not be able to assist you with questions regarding bills from these companies.  You will be contacted with the lab results as soon as they are available. The fastest way to get your results is to activate your My Chart account. Instructions are located on the last page of this paperwork. If you have not heard from [place] regarding the results in 2 weeks, please contact this office.

## 2019-11-10 NOTE — Progress Notes (Signed)
Subjective:  Patient ID: Nicholas Finley, male    DOB: [birth year]  Age: 85 y.o. MRN: [id number]  CC:  Chief Complaint  Patient presents with  . Medical Management of Chronic Issues    6 week f/u on chronic med condition and to f/u on new bp meds. Patient stated his bp at home has been averging 182/79-161/80. Patient was informed to bring his bp machine when he come back for another visit so we can check it here in the office    HPI Nicholas Finley presents for   Hypertension: Borderline elevated last visit.  Continue lisinopril same dose, doxazosin 1 mg given to also help with likely BPH.  PSA was normal at 0.8. Home readings:161-182/79-80.  No CP/dyspnea/HA.  No missed doses of meds, no new side effects.  BP Readings from Last 3 Encounters:  11/10/19 138/72  09/29/19 (!) 143/76  07/28/19 136/78   Lab Results  Component Value Date   CREATININE 0.67 (L) 09/29/2019   Nocturia Longstanding symptoms discussed in October visit.  Kegel exercises, incontinence pads discussed.  Doxazosin 1 mg prescribed. Helped - less nocturia. Only twice now. Has also worked on [employer] that have helped.  No change in intermittent dizziness. Not worse.   History Patient Active Problem List   Diagnosis Date Noted  . HTN (hypertension) 03/01/2012  . Hyperlipidemia 03/01/2012  . Hearing difficulty 03/01/2012  . Allergic rhinitis 03/01/2012   Past Medical History:  Diagnosis Date  . Allergy   . Cataract   . Enlarged prostate   . Heart murmur   . Hypercholesteremia   . Hypertension    Past Surgical History:  Procedure Laterality Date  . CYST EXCISION     pilonidal  . SKIN CANCER EXCISION     No Known Allergies Prior to Admission medications   Medication Sig Start Date End Date Taking? Authorizing Provider  aspirin 81 MG tablet Take 81 mg by mouth daily.   Yes [provider]  atorvastatin (LIPITOR) 10 MG tablet Take 1 tablet (10 mg total) by mouth daily. 03/10/19   Yes [name], MD  Azelastine HCl (ASTEPRO) 0.15 % SOLN instill 1 spray into each nostril twice a day if needed 03/10/19  Yes [name], MD  doxazosin (CARDURA) 1 MG tablet Take 1 tablet (1 mg total) by mouth daily. 09/29/19  Yes [name], MD  lisinopril (PRINIVIL,ZESTRIL) 10 MG tablet Take 1 tablet (10 mg total) by mouth daily. 03/10/19  Yes [name], MD  terbinafine (LAMISIL) 250 MG tablet Take 250 mg by mouth daily.   Yes [provider]   Social History   Socioeconomic History  . Marital status: Single    Spouse name: Not on file  . Number of children: 0  . Years of education: 68  . Highest education level: Not on file  Occupational History    Comment: retired  Scientific laboratory technician  . Financial resource strain: Not on file  . Food insecurity    Worry: Not on file    Inability: Not on file  . Transportation needs    Medical: Not on file    Non-medical: Not on file  Tobacco Use  . Smoking status: Current Some Day Smoker    Types: Cigarettes, Pipe, Cigars  . Smokeless tobacco: Never Used  . Tobacco comment: cigars   Substance and Sexual Activity  . Alcohol use: Yes    Alcohol/week: 0.0 standard drinks    Comment: occas beer  .  Drug use: No  . Sexual activity: Not on file  Lifestyle  . Physical activity    Days per week: Not on file    Minutes per session: Not on file  . Stress: Not on file  Relationships  . Social Herbalist on phone: Not on file    Gets together: Not on file    Attends religious service: Not on file    Active member of club or organization: Not on file    Attends meetings of clubs or organizations: Not on file    Relationship status: Not on file  . Intimate partner violence    Fear of current or ex partner: Not on file    Emotionally abused: Not on file    Physically abused: Not on file    Forced sexual activity: Not on file  Other Topics Concern  . Not on file  Social History Narrative   Patient is  right handed, resides alone, consumes caffeine twice daily    Review of Systems Per HPI  Objective:   Vitals:   11/10/19 1014  BP: 138/72  Pulse: 78  Temp: 98.8 F (37.1 C)  TempSrc: Oral  SpO2: 96%  Weight: 163 lb (73.9 kg)     Physical Exam Vitals signs reviewed.  Constitutional:      Appearance: He is well-developed.  HENT:     Head: Normocephalic and atraumatic.  Eyes:     Pupils: Pupils are equal, round, and reactive to light.  Neck:     Vascular: No carotid bruit or JVD.  Cardiovascular:     Rate and Rhythm: Normal rate and regular rhythm.     Heart sounds: Murmur (2-3/6 SEM) present.  Pulmonary:     Effort: Pulmonary effort is normal.     Breath sounds: Normal breath sounds. No rales.  Skin:    General: Skin is warm and dry.  Neurological:     Mental Status: He is alert and oriented to person, place, and time.     Assessment & Plan:  Nicholas Finley is a 85 y.o. male . Nocturia  Urinary incontinence, unspecified type  Essential hypertension  Improving nocturia/urinary symptoms. Continue doxazosin.  BP stable. Home readings inconsistent - doubt accuracy of meter - can bring to office to check against ours.  recheck in 4 months.   No orders of the defined types were placed in this encounter.  Patient Instructions       If you have lab work done today you will be contacted with your lab results within the next 2 weeks.  If you have not heard from [place] then please contact us. The fastest way to get your results is to register for My Chart.   IF you received an x-ray today, you will receive an invoice from [facility]. Please contact [facility] at [phone] with questions or concerns regarding your invoice.   IF you received labwork today, you will receive an invoice from [facility]. Please contact [facility] at [phone] with questions or concerns regarding your invoice.   Our billing staff will not be able to assist you  with questions regarding bills from these companies.  You will be contacted with the lab results as soon as they are available. The fastest way to get your results is to activate your My Chart account. Instructions are located on the last page of this paperwork. If you have not heard from [place] regarding the results in 2 weeks, please contact this office.  Signed, Merri Ray, MD [facility]

## 2019-11-19 DIAGNOSIS — M79675 Pain in left toe(s): Secondary | ICD-10-CM | POA: Diagnosis not present

## 2019-11-19 DIAGNOSIS — B351 Tinea unguium: Secondary | ICD-10-CM | POA: Diagnosis not present

## 2019-11-19 DIAGNOSIS — M79674 Pain in right toe(s): Secondary | ICD-10-CM | POA: Diagnosis not present

## 2019-11-19 DIAGNOSIS — L600 Ingrowing nail: Secondary | ICD-10-CM | POA: Diagnosis not present

## 2019-12-02 DIAGNOSIS — L570 Actinic keratosis: Secondary | ICD-10-CM | POA: Diagnosis not present

## 2019-12-02 DIAGNOSIS — L821 Other seborrheic keratosis: Secondary | ICD-10-CM | POA: Diagnosis not present

## 2019-12-02 DIAGNOSIS — D1801 Hemangioma of skin and subcutaneous tissue: Secondary | ICD-10-CM | POA: Diagnosis not present

## 2019-12-02 DIAGNOSIS — L814 Other melanin hyperpigmentation: Secondary | ICD-10-CM | POA: Diagnosis not present

## 2019-12-02 DIAGNOSIS — D225 Melanocytic nevi of trunk: Secondary | ICD-10-CM | POA: Diagnosis not present

## 2019-12-05 ENCOUNTER — Other Ambulatory Visit: Payer: Self-pay | Admitting: Family Medicine

## 2019-12-05 DIAGNOSIS — I10 Essential (primary) hypertension: Secondary | ICD-10-CM

## 2019-12-05 NOTE — Telephone Encounter (Signed)
Forwarding medication refill request to the clinical pool for review. 

## 2020-01-13 DIAGNOSIS — D485 Neoplasm of uncertain behavior of skin: Secondary | ICD-10-CM | POA: Diagnosis not present

## 2020-01-13 DIAGNOSIS — L570 Actinic keratosis: Secondary | ICD-10-CM | POA: Diagnosis not present

## 2020-01-13 DIAGNOSIS — C4442 Squamous cell carcinoma of skin of scalp and neck: Secondary | ICD-10-CM | POA: Diagnosis not present

## 2020-02-06 DIAGNOSIS — C4442 Squamous cell carcinoma of skin of scalp and neck: Secondary | ICD-10-CM | POA: Diagnosis not present

## 2020-02-09 DIAGNOSIS — B351 Tinea unguium: Secondary | ICD-10-CM | POA: Diagnosis not present

## 2020-02-09 DIAGNOSIS — L600 Ingrowing nail: Secondary | ICD-10-CM | POA: Diagnosis not present

## 2020-02-09 DIAGNOSIS — M24574 Contracture, right foot: Secondary | ICD-10-CM | POA: Diagnosis not present

## 2020-02-09 DIAGNOSIS — L97512 Non-pressure chronic ulcer of other part of right foot with fat layer exposed: Secondary | ICD-10-CM | POA: Diagnosis not present

## 2020-02-23 DIAGNOSIS — M24574 Contracture, right foot: Secondary | ICD-10-CM | POA: Diagnosis not present

## 2020-03-08 ENCOUNTER — Encounter: Payer: Self-pay | Admitting: Family Medicine

## 2020-03-08 ENCOUNTER — Other Ambulatory Visit: Payer: Self-pay

## 2020-03-08 ENCOUNTER — Ambulatory Visit (INDEPENDENT_AMBULATORY_CARE_PROVIDER_SITE_OTHER): Payer: Medicare Other | Admitting: Family Medicine

## 2020-03-08 VITALS — BP 122/70 | HR 79 | Temp 97.7°F | Ht 66.5 in | Wt 161.0 lb

## 2020-03-08 DIAGNOSIS — R7303 Prediabetes: Secondary | ICD-10-CM | POA: Diagnosis not present

## 2020-03-08 DIAGNOSIS — R42 Dizziness and giddiness: Secondary | ICD-10-CM

## 2020-03-08 DIAGNOSIS — R351 Nocturia: Secondary | ICD-10-CM

## 2020-03-08 DIAGNOSIS — E785 Hyperlipidemia, unspecified: Secondary | ICD-10-CM | POA: Diagnosis not present

## 2020-03-08 DIAGNOSIS — I10 Essential (primary) hypertension: Secondary | ICD-10-CM | POA: Diagnosis not present

## 2020-03-08 LAB — COMPREHENSIVE METABOLIC PANEL
ALT: 14 IU/L (ref 0–44)
AST: 16 IU/L (ref 0–40)
Albumin/Globulin Ratio: 1.8 (ref 1.2–2.2)
Albumin: 4.1 g/dL (ref 3.5–4.6)
Alkaline Phosphatase: 60 IU/L (ref 39–117)
BUN/Creatinine Ratio: 20 (ref 10–24)
BUN: 14 mg/dL (ref 10–36)
Bilirubin Total: 0.7 mg/dL (ref 0.0–1.2)
CO2: 21 mmol/L (ref 20–29)
Calcium: 9.1 mg/dL (ref 8.6–10.2)
Chloride: 105 mmol/L (ref 96–106)
Creatinine, Ser: 0.69 mg/dL — ABNORMAL LOW (ref 0.76–1.27)
GFR calc Af Amer: 96 mL/min/{1.73_m2} (ref >59)
GFR calc non Af Amer: 83 mL/min/{1.73_m2} (ref >59)
Globulin, Total: 2.3 g/dL (ref 1.5–4.5)
Glucose: 120 mg/dL — ABNORMAL HIGH (ref 65–99)
Potassium: 4 mmol/L (ref 3.5–5.2)
Sodium: 140 mmol/L (ref 134–144)
Total Protein: 6.4 g/dL (ref 6.0–8.5)

## 2020-03-08 LAB — LIPID PANEL
Chol/HDL Ratio: 2.3 ratio (ref 0.0–5.0)
Cholesterol, Total: 136 mg/dL (ref 100–199)
HDL: 59 mg/dL (ref >39)
LDL Chol Calc (NIH): 63 mg/dL (ref 0–99)
Triglycerides: 66 mg/dL (ref 0–149)
VLDL Cholesterol Cal: 14 mg/dL (ref 5–40)

## 2020-03-08 LAB — HEMOGLOBIN A1C
Est. average glucose Bld gHb Est-mCnc: 120 mg/dL
Hgb A1c MFr Bld: 5.8 % — ABNORMAL HIGH (ref 4.8–5.6)

## 2020-03-08 MED ORDER — DOXAZOSIN MESYLATE 1 MG PO TABS: 1.0000 mg | ORAL_TABLET | Freq: Every day | ORAL | 1 refills | Status: DC

## 2020-03-08 MED ORDER — ATORVASTATIN CALCIUM 10 MG PO TABS: 10.0000 mg | ORAL_TABLET | Freq: Every day | ORAL | 2 refills | Status: DC

## 2020-03-08 MED ORDER — LISINOPRIL 10 MG PO TABS: 10.0000 mg | ORAL_TABLET | Freq: Every day | ORAL | 1 refills | Status: DC

## 2020-03-08 NOTE — Progress Notes (Signed)
Subjective:  Patient ID: Nicholas Finley, male    DOB: 08/12/1926  Age: 86 y.o. MRN: [id number]  CC:  Chief Complaint  Patient presents with  . Follow-up    Nocturia and hypertension. pt is still having frequent night time urinations. pt states his current medication for this isn't working to well pt states he hasn't had any issues with his BP at home. pt has had some dizzieness from time to time. pt states the dizzieness gets works if he looks up.    HPI Nicholas Finley presents for   Hypertension: Lisinopril 10 mg daily.  Additionally treated with doxazosin to help with BPH, borderline previous readings.  Does note occasional dizziness if looking up - only briefly - improves in minute or so.  holds onto object, rarely stepping down curbs. Ok on stairs if they have a handrail. No falls. No walking stick/cane. Declines cardiology eval for possible vascular steal at this time.   Brain MRI in 2015 with moderate perisylvian and mesial temporal atrophy, moderate periventricular and subcortical chronic small vessel ischemic disease but no acute findings.  Home readings:none.  BP Readings from Last 3 Encounters:  03/08/20 122/70  11/10/19 138/72  09/29/19 (!) 143/76   Lab Results  Component Value Date   CREATININE 0.67 (L) 09/29/2019    Nocturia Longstanding symptoms.  Doxazosin 1 mg prescribed in October last year.  Did report some improvement at his November visit, also discussed Kegel exercises that have been helpful.  PSA normal 0.8 in October 2020.  Has seen urology - about 6 months ago?    Still taking doxazosin - min change in symptoms. Concerned about any side effects, not sure if helping much. 3 episodes of nocturia per night.   Prediabetes: Walking for exercise - about 1 mile per day.   Lab Results  Component Value Date   HGBA1C 5.8 (H) 03/10/2019   Wt Readings from Last 3 Encounters:  03/08/20 161 lb (73 kg)  11/10/19 163 lb (73.9 kg)  09/29/19 159 lb 6.4 oz  (72.3 kg)   Has not received covid vaccine, but would like to get one.   Hyperlipidemia: lipitor 56m qd, no new myalgias.   Lab Results  Component Value Date   CHOL 145 03/10/2019   HDL 52 03/10/2019   LDLCALC 69 03/10/2019   TRIG 118 03/10/2019   CHOLHDL 2.8 03/10/2019   Lab Results  Component Value Date   ALT 16 03/10/2019   AST 21 03/10/2019   ALKPHOS 55 03/10/2019   BILITOT 0.5 03/10/2019     History Patient Active Problem List   Diagnosis Date Noted  . HTN (hypertension) 03/01/2012  . Hyperlipidemia 03/01/2012  . Hearing difficulty 03/01/2012  . Allergic rhinitis 03/01/2012   Past Medical History:  Diagnosis Date  . Allergy   . Cataract   . Enlarged prostate   . Heart murmur   . Hypercholesteremia   . Hypertension    Past Surgical History:  Procedure Laterality Date  . CYST EXCISION     pilonidal  . SKIN CANCER EXCISION     No Known Allergies Prior to Admission medications   Medication Sig Start Date End Date Taking? Authorizing Provider  aspirin 81 MG tablet Take 81 mg by mouth daily.   Yes [provider]  atorvastatin (LIPITOR) 10 MG tablet Take 1 tablet (10 mg total) by mouth daily. 03/10/19  Yes GWendie Agreste MD  Azelastine HCl (ASTEPRO) 0.15 % SOLN instill 1 spray into each nostril twice  a day if needed 03/10/19  Yes [name], MD  doxazosin (CARDURA) 1 MG tablet Take 1 tablet (1 mg total) by mouth daily. 09/29/19  Yes [name], MD  lisinopril (ZESTRIL) 10 MG tablet TAKE ONE TABLET BY MOUTH DAILY 12/05/19  Yes [name], MD  terbinafine (LAMISIL) 250 MG tablet Take 250 mg by mouth daily.    [provider]   Social History   Socioeconomic History  . Marital status: Single    Spouse name: Not on file  . Number of children: 0  . Years of education: 63  . Highest education level: Not on file  Occupational History    Comment: retired  Tobacco Use  . Smoking status: Current Some Day Smoker     Types: Cigarettes, Pipe, Cigars  . Smokeless tobacco: Never Used  . Tobacco comment: cigars   Substance and Sexual Activity  . Alcohol use: Yes    Alcohol/week: 0.0 standard drinks    Comment: occas beer  . Drug use: No  . Sexual activity: Not on file  Other Topics Concern  . Not on file  Social History Narrative   Patient is right handed, resides alone, consumes caffeine twice daily   Social Determinants of Health   Financial Resource Strain:   . Difficulty of Paying Living Expenses:   Food Insecurity:   . Worried About Charity fundraiser in the Last Year:   . Arboriculturist in the Last Year:   Transportation Needs:   . Film/video editor (Medical):   Marland Kitchen Lack of Transportation (Non-Medical):   Physical Activity:   . Days of Exercise per Week:   . Minutes of Exercise per Session:   Stress:   . Feeling of Stress :   Social Connections:   . Frequency of Communication with Friends and Family:   . Frequency of Social Gatherings with Friends and Family:   . Attends Religious Services:   . Active Member of Clubs or Organizations:   . Attends Archivist Meetings:   Marland Kitchen Marital Status:   Intimate Partner Violence:   . Fear of Current or Ex-Partner:   . Emotionally Abused:   Marland Kitchen Physically Abused:   . Sexually Abused:     Review of Systems  Constitutional: Negative for fatigue and unexpected weight change.  Eyes: Negative for visual disturbance.  Respiratory: Negative for cough, chest tightness and shortness of breath.   Cardiovascular: Negative for chest pain, palpitations and leg swelling.  Gastrointestinal: Negative for abdominal pain and blood in stool.  Neurological: Positive for dizziness (per hpi. ). Negative for light-headedness and headaches.     Objective:   Vitals:   03/08/20 0929 03/08/20 0934  BP: (!) 151/79 122/70  Pulse: 79   Temp: 97.7 F (36.5 C)   TempSrc: Temporal   SpO2: 97%   Weight: 161 lb (73 kg)   Height: 5' 6.5" (1.689 m)       Physical Exam Vitals reviewed.  Constitutional:      Appearance: He is well-developed.  HENT:     Head: Normocephalic and atraumatic.  Eyes:     Pupils: Pupils are equal, round, and reactive to light.  Neck:     Vascular: No carotid bruit or JVD.  Cardiovascular:     Rate and Rhythm: Normal rate and regular rhythm.     Heart sounds: Murmur (3/6 systolic ejection. ) present.  Pulmonary:     Effort: Pulmonary effort is normal.  Breath sounds: Normal breath sounds. No rales.  Skin:    General: Skin is warm and dry.  Neurological:     Mental Status: He is alert and oriented to person, place, and time.      Assessment & Plan:  Gustave Lindeman is a 86 y.o. male . Essential hypertension - Plan: Lipid panel, Comprehensive metabolic panel  -  BP Stable, tolerating current regimen. Medications refilled. Labs pending as above.   -Potential vascular steal discussed, but declined cardiology eval at this time.  ER/RTC precautions.  Fall precautions discussed  Prediabetes - Plan: Hemoglobin A1c  -Repeat A1c  Hyperlipidemia, unspecified hyperlipidemia type - Plan: Lipid panel, Comprehensive metabolic panel  -Tolerating current regimen, continue Lipitor.  Nocturia  -Likely component of BPH.  Most episodes of dizziness and hesitant to change or increase medications.  Recommend he discuss further with his urologist and he will schedule appointment.  Episode of dizziness  -As above differential includes vascular steal.  Declined further intervention or cardiology eval at this time.  Fall precautions discussed, cane or walking stick recommended outside the home.  RTC precautions.  No orders of the defined types were placed in this encounter.  Patient Instructions    See info on fall precautions below. Cane or walking stick may be helpful if on uneven ground. If more frequent dizziness looking up, I would recommend discussing with cardiology to make sure this is not from blood  vessel issue.   I would recommend meeting back with urologist to discuss options for nighttime urination. I am concerned about risk of falling with higher doses of doxazosin or other meds. Please discuss options with [name].   Return to the clinic or go to the nearest emergency room if any of your symptoms worsen or new symptoms occur.  Understanding Your Risk for Falls Each year, millions of people have serious injuries from falls. It is important to understand your risk for falling. Talk with your health care provider about your risk and what you can do to lower it. There are actions you can take at home to lower your risk. If you do have a serious fall, make sure you tell your health care provider. Falling once raises your risk for falling again. How can falls affect me? Serious injuries from falls are common. These include:  Broken bones, such as hip fractures.  Head injuries, such as traumatic brain injuries (TBI). Fear of falling can also cause you to avoid activities and stay at home. This can make your muscles weaker and actually raise your risk for a fall. What can increase my risk? There are a number of risk factors that increase your risk for falling. The more risk factors you have, the higher your risk for falling. Serious injuries from a fall most often happen to people older than age 28. Children and young adults ages 34-29 are also at higher risk. Common risk factors include:  Weakness in the lower body.  Lack (deficiency) of vitamin D.  Being generally weak or confused due to long-term (chronic) illness.  Dizziness or balance problems.  Poor vision.  Medicines that cause dizziness or drowsiness. These can include medicines for your blood pressure, heart, anxiety, insomnia, or edema, as well as pain medicines and muscle relaxants. Other risk factors include:  Drinking alcohol.  Having had a fall in the past.  Having depression.  Foot pain or improper  footwear.  Working at a dangerous job.  Having any of the following in your home: ?  Tripping hazards, such as floor clutter or loose rugs. ? Poor lighting. ? Pets or clutter.  Dementia or memory loss. What actions can I take to lower my risk of falling?     Physical activity Maintain physical fitness. Do strength and balance exercises. Consider taking a regular class to build strength and balance. Yoga and tai chi are good options. Vision Have your eyes checked every year and your vision prescription updated as needed. Walking aids and footwear  Wear nonskid shoes. Do not wear high heels.  Do not walk around the house in socks or slippers.  Use a cane or walker as told by your health care provider. Home safety  Attach secure railings on both sides of your stairs.  Install grab bars for your tub, shower, and toilet. Use a bath mat in your tub or shower.  Use good lighting in all rooms. Keep a flashlight near your bed.  Make sure there is a clear path from your bed to the bathroom. Use night-lights.  Do not use throw rugs. Make sure all carpeting is taped or tacked down securely.  Remove all clutter from walkways and stairways, including extension cords.  Repair uneven or broken steps.  Avoid walking on icy or slippery surfaces. Walk on the grass instead of on icy or slick sidewalks. Where you can, use ice melt to get rid of ice on walkways.  Use a cordless phone. Questions to ask your health care provider  Can you help me check my risk for a fall?  Do any of my medicines make me more likely to fall?  Should I take a vitamin D supplement?  What exercises can I do to improve my strength and balance?  Should I make an appointment to have my vision checked?  Do I need a bone density test to check for weak bones or osteoporosis?  Would it help to use a cane or a walker? Where to find more information  Centers for Disease Control and Prevention, STEADI:  http://www.wolf.info/  Community-Based Fall Prevention Programs: http://www.wolf.info/  National Institute on Aging: ToneConnect.com.ee Contact a health care provider if:  You fall at home.  You are afraid of falling at home.  You feel weak, drowsy, or dizzy. Summary  People 38 and older are at high risk for falling. However, older people are not the only ones injured in falls. Children and young adults have a higher-than-normal risk too.  Talk with your health care provider about your risks for falling and how to lower those risks.  Taking certain precautions at home can lower your risk for falling.  If you fall, always tell your health care provider. This information is not intended to replace advice given to you by your health care provider. Make sure you discuss any questions you have with your health care provider. Document Revised: 06/18/2019 Document Reviewed: 06/18/2019 Elsevier Patient Education  [employer].    If you have lab work done today you will be contacted with your lab results within the next 2 weeks.  If you have not heard from [place] then please contact us. The fastest way to get your results is to register for My Chart.   IF you received an x-ray today, you will receive an invoice from [facility]. Please contact [facility] at [phone] with questions or concerns regarding your invoice.   IF you received labwork today, you will receive an invoice from [facility]. Please contact [facility] at [phone] with questions or concerns regarding your  invoice.   Our billing staff will not be able to assist you with questions regarding bills from these companies.  You will be contacted with the lab results as soon as they are available. The fastest way to get your results is to activate your My Chart account. Instructions are located on the last page of this paperwork. If you have not heard from [place] regarding the results in 2 weeks, please contact this office.          Signed, Merri Ray, MD [facility]

## 2020-03-08 NOTE — Patient Instructions (Addendum)
See info on fall precautions below. Cane or walking stick may be helpful if on uneven ground. If more frequent dizziness looking up, I would recommend discussing with cardiology to make sure this is not from blood vessel issue.   I would recommend meeting back with urologist to discuss options for nighttime urination. I am concerned about risk of falling with higher doses of doxazosin or other meds. Please discuss options with [name].   Return to the clinic or go to the nearest emergency room if any of your symptoms worsen or new symptoms occur.  Understanding Your Risk for Falls Each year, millions of people have serious injuries from falls. It is important to understand your risk for falling. Talk with your health care provider about your risk and what you can do to lower it. There are actions you can take at home to lower your risk. If you do have a serious fall, make sure you tell your health care provider. Falling once raises your risk for falling again. How can falls affect me? Serious injuries from falls are common. These include:  Broken bones, such as hip fractures.  Head injuries, such as traumatic brain injuries (TBI). Fear of falling can also cause you to avoid activities and stay at home. This can make your muscles weaker and actually raise your risk for a fall. What can increase my risk? There are a number of risk factors that increase your risk for falling. The more risk factors you have, the higher your risk for falling. Serious injuries from a fall most often happen to people older than age 86. Children and young adults ages 3-29 are also at higher risk. Common risk factors include:  Weakness in the lower body.  Lack (deficiency) of vitamin D.  Being generally weak or confused due to long-term (chronic) illness.  Dizziness or balance problems.  Poor vision.  Medicines that cause dizziness or drowsiness. These can include medicines for your blood pressure,  heart, anxiety, insomnia, or edema, as well as pain medicines and muscle relaxants. Other risk factors include:  Drinking alcohol.  Having had a fall in the past.  Having depression.  Foot pain or improper footwear.  Working at a dangerous job.  Having any of the following in your home: ? Tripping hazards, such as floor clutter or loose rugs. ? Poor lighting. ? Pets or clutter.  Dementia or memory loss. What actions can I take to lower my risk of falling?     Physical activity Maintain physical fitness. Do strength and balance exercises. Consider taking a regular class to build strength and balance. Yoga and tai chi are good options. Vision Have your eyes checked every year and your vision prescription updated as needed. Walking aids and footwear  Wear nonskid shoes. Do not wear high heels.  Do not walk around the house in socks or slippers.  Use a cane or walker as told by your health care provider. Home safety  Attach secure railings on both sides of your stairs.  Install grab bars for your tub, shower, and toilet. Use a bath mat in your tub or shower.  Use good lighting in all rooms. Keep a flashlight near your bed.  Make sure there is a clear path from your bed to the bathroom. Use night-lights.  Do not use throw rugs. Make sure all carpeting is taped or tacked down securely.  Remove all clutter from walkways and stairways, including extension cords.  Repair uneven or broken steps.  Avoid  walking on icy or slippery surfaces. Walk on the grass instead of on icy or slick sidewalks. Where you can, use ice melt to get rid of ice on walkways.  Use a cordless phone. Questions to ask your health care provider  Can you help me check my risk for a fall?  Do any of my medicines make me more likely to fall?  Should I take a vitamin D supplement?  What exercises can I do to improve my strength and balance?  Should I make an appointment to have my vision  checked?  Do I need a bone density test to check for weak bones or osteoporosis?  Would it help to use a cane or a walker? Where to find more information  Centers for Disease Control and Prevention, STEADI: http://www.wolf.info/  Community-Based Fall Prevention Programs: http://www.wolf.info/  National Institute on Aging: ToneConnect.com.ee Contact a health care provider if:  You fall at home.  You are afraid of falling at home.  You feel weak, drowsy, or dizzy. Summary  People 58 and older are at high risk for falling. However, older people are not the only ones injured in falls. Children and young adults have a higher-than-normal risk too.  Talk with your health care provider about your risks for falling and how to lower those risks.  Taking certain precautions at home can lower your risk for falling.  If you fall, always tell your health care provider. This information is not intended to replace advice given to you by your health care provider. Make sure you discuss any questions you have with your health care provider. Document Revised: 06/18/2019 Document Reviewed: 06/18/2019 Elsevier Patient Education  [employer].    If you have lab work done today you will be contacted with your lab results within the next 2 weeks.  If you have not heard from [place] then please contact us. The fastest way to get your results is to register for My Chart.   IF you received an x-ray today, you will receive an invoice from [facility]. Please contact [facility] at [phone] with questions or concerns regarding your invoice.   IF you received labwork today, you will receive an invoice from [facility]. Please contact [facility] at [phone] with questions or concerns regarding your invoice.   Our billing staff will not be able to assist you with questions regarding bills from these companies.  You will be contacted with the lab results as soon as they are available. The fastest way  to get your results is to activate your My Chart account. Instructions are located on the last page of this paperwork. If you have not heard from [place] regarding the results in 2 weeks, please contact this office.

## 2020-03-11 DIAGNOSIS — M71572 Other bursitis, not elsewhere classified, left ankle and foot: Secondary | ICD-10-CM | POA: Diagnosis not present

## 2020-03-11 DIAGNOSIS — M2041 Other hammer toe(s) (acquired), right foot: Secondary | ICD-10-CM | POA: Diagnosis not present

## 2020-03-11 DIAGNOSIS — M2042 Other hammer toe(s) (acquired), left foot: Secondary | ICD-10-CM | POA: Diagnosis not present

## 2020-03-11 DIAGNOSIS — L97512 Non-pressure chronic ulcer of other part of right foot with fat layer exposed: Secondary | ICD-10-CM | POA: Diagnosis not present

## 2020-03-11 DIAGNOSIS — L600 Ingrowing nail: Secondary | ICD-10-CM | POA: Diagnosis not present

## 2020-03-17 ENCOUNTER — Encounter: Payer: Self-pay | Admitting: Radiology

## 2020-03-24 DIAGNOSIS — B351 Tinea unguium: Secondary | ICD-10-CM | POA: Diagnosis not present

## 2020-03-24 DIAGNOSIS — L600 Ingrowing nail: Secondary | ICD-10-CM | POA: Diagnosis not present

## 2020-04-02 DIAGNOSIS — D225 Melanocytic nevi of trunk: Secondary | ICD-10-CM | POA: Diagnosis not present

## 2020-04-02 DIAGNOSIS — L814 Other melanin hyperpigmentation: Secondary | ICD-10-CM | POA: Diagnosis not present

## 2020-04-02 DIAGNOSIS — L905 Scar conditions and fibrosis of skin: Secondary | ICD-10-CM | POA: Diagnosis not present

## 2020-04-02 DIAGNOSIS — Z85828 Personal history of other malignant neoplasm of skin: Secondary | ICD-10-CM | POA: Diagnosis not present

## 2020-08-25 ENCOUNTER — Other Ambulatory Visit: Payer: Self-pay | Admitting: Family Medicine

## 2020-08-25 DIAGNOSIS — R351 Nocturia: Secondary | ICD-10-CM

## 2020-08-25 DIAGNOSIS — I10 Essential (primary) hypertension: Secondary | ICD-10-CM

## 2020-09-08 ENCOUNTER — Encounter: Payer: Self-pay | Admitting: Family Medicine

## 2020-09-08 ENCOUNTER — Ambulatory Visit (INDEPENDENT_AMBULATORY_CARE_PROVIDER_SITE_OTHER): Payer: Medicare Other | Admitting: Family Medicine

## 2020-09-08 ENCOUNTER — Other Ambulatory Visit: Payer: Self-pay

## 2020-09-08 VITALS — BP 124/70 | HR 60 | Temp 97.8°F | Ht 66.5 in | Wt 152.0 lb

## 2020-09-08 DIAGNOSIS — E785 Hyperlipidemia, unspecified: Secondary | ICD-10-CM

## 2020-09-08 DIAGNOSIS — R7303 Prediabetes: Secondary | ICD-10-CM

## 2020-09-08 DIAGNOSIS — Z23 Encounter for immunization: Secondary | ICD-10-CM

## 2020-09-08 DIAGNOSIS — I10 Essential (primary) hypertension: Secondary | ICD-10-CM | POA: Diagnosis not present

## 2020-09-08 LAB — COMPREHENSIVE METABOLIC PANEL
ALT: 11 IU/L (ref 0–44)
AST: 13 IU/L (ref 0–40)
Albumin/Globulin Ratio: 1.5 (ref 1.2–2.2)
Albumin: 3.8 g/dL (ref 3.5–4.6)
Alkaline Phosphatase: 64 IU/L (ref 44–121)
BUN/Creatinine Ratio: 19 (ref 10–24)
BUN: 13 mg/dL (ref 10–36)
Bilirubin Total: 1.1 mg/dL (ref 0.0–1.2)
CO2: 21 mmol/L (ref 20–29)
Calcium: 8.9 mg/dL (ref 8.6–10.2)
Chloride: 105 mmol/L (ref 96–106)
Creatinine, Ser: 0.68 mg/dL — ABNORMAL LOW (ref 0.76–1.27)
GFR calc Af Amer: 96 mL/min/{1.73_m2} (ref >59)
GFR calc non Af Amer: 83 mL/min/{1.73_m2} (ref >59)
Globulin, Total: 2.5 g/dL (ref 1.5–4.5)
Glucose: 94 mg/dL (ref 65–99)
Potassium: 4.1 mmol/L (ref 3.5–5.2)
Sodium: 139 mmol/L (ref 134–144)
Total Protein: 6.3 g/dL (ref 6.0–8.5)

## 2020-09-08 LAB — LIPID PANEL
Chol/HDL Ratio: 2.4 ratio (ref 0.0–5.0)
Cholesterol, Total: 124 mg/dL (ref 100–199)
HDL: 51 mg/dL (ref >39)
LDL Chol Calc (NIH): 62 mg/dL (ref 0–99)
Triglycerides: 48 mg/dL (ref 0–149)
VLDL Cholesterol Cal: 11 mg/dL (ref 5–40)

## 2020-09-08 LAB — HEMOGLOBIN A1C
Est. average glucose Bld gHb Est-mCnc: 120 mg/dL
Hgb A1c MFr Bld: 5.8 % — ABNORMAL HIGH (ref 4.8–5.6)

## 2020-09-08 NOTE — Progress Notes (Signed)
Subjective:  Patient ID: Nicholas Finley, male    DOB: July 17, 1926  Age: 86 y.o. MRN: [id number]  CC:  Chief Complaint  Patient presents with  . Follow-up    on hypertension, prediabetes, and hyperlipidemia. Pt reports no issues with thes conditions since last OV and he has been taking his medication as prescibed. Pt reports still having  some dizzieness at times when he first stands up.    HPI [name] presents for   Hypertension: Treated with doxazosin 1 mg daily(also for BPH), lisinopril 10 mg daily.  Discussed dizziness with looking up at prior visit, vascular steal discussed in differential, but he declined vascular or cardiology eval.  Chronic small vessel ischemic disease noted on brain MRI in 2015.  Fall precautions were discussed last visit. Only notes lightheaded if getting up suddenly - holds on to object. Resolves quickly in few seconds, same with looking up - resolves quickly - declines further eval.  Walking 1 mile per day.    Home readings: none, no new side effects with meds.  BP Readings from Last 3 Encounters:  09/08/20 124/70  03/08/20 122/70  11/10/19 138/72   Lab Results  Component Value Date   CREATININE 0.69 (L) 03/08/2020   PreDiabetes: Has lost weight, he does attribute some of this to cooking at home. No new pain, night sweats or fever. Nocturia same 2-3 per night. Has not seen urology recently.  Wt Readings from Last 3 Encounters:  09/08/20 152 lb (68.9 kg)  03/08/20 161 lb (73 kg)  11/10/19 163 lb (73.9 kg)   Lab Results  Component Value Date   HGBA1C 5.8 (H) 03/08/2020   HGBA1C 5.8 (H) 03/10/2019   HGBA1C 5.8 (H) 06/13/2018   Hyperlipidemia: Lipitor 10 mg daily. No new myalgias.  Lab Results  Component Value Date   CHOL 136 03/08/2020   HDL 59 03/08/2020   LDLCALC 63 03/08/2020   TRIG 66 03/08/2020   CHOLHDL 2.3 03/08/2020   Lab Results  Component Value Date   ALT 14 03/08/2020   AST 16 03/08/2020   ALKPHOS 60 03/08/2020     BILITOT 0.7 03/08/2020      History Patient Active Problem List   Diagnosis Date Noted  . HTN (hypertension) 03/01/2012  . Hyperlipidemia 03/01/2012  . Hearing difficulty 03/01/2012  . Allergic rhinitis 03/01/2012   Past Medical History:  Diagnosis Date  . Allergy   . Cataract   . Enlarged prostate   . Heart murmur   . Hypercholesteremia   . Hypertension    Past Surgical History:  Procedure Laterality Date  . CYST EXCISION     pilonidal  . SKIN CANCER EXCISION     No Known Allergies Prior to Admission medications   Medication Sig Start Date End Date Taking? Authorizing Provider  aspirin 81 MG tablet Take 81 mg by mouth daily.   Yes [provider]  atorvastatin (LIPITOR) 10 MG tablet Take 1 tablet (10 mg total) by mouth daily. 03/08/20  Yes [name], MD  Azelastine HCl (ASTEPRO) 0.15 % SOLN instill 1 spray into each nostril twice a day if needed 03/10/19  Yes [name], MD  doxazosin (CARDURA) 1 MG tablet TAKE ONE TABLET BY MOUTH DAILY 08/25/20  Yes [name], MD  lisinopril (ZESTRIL) 10 MG tablet Take 1 tablet (10 mg total) by mouth daily. 03/08/20  Yes [name], MD   Social History   Socioeconomic History  . Marital status: Single  Spouse name: Not on file  . Number of children: 0  . Years of education: 17  . Highest education level: Not on file  Occupational History    Comment: retired  Tobacco Use  . Smoking status: Current Some Day Smoker    Types: Cigarettes, Pipe, Cigars  . Smokeless tobacco: Never Used  . Tobacco comment: cigars   Vaping Use  . Vaping Use: Never used  Substance and Sexual Activity  . Alcohol use: Yes    Alcohol/week: 0.0 standard drinks    Comment: occas beer  . Drug use: No  . Sexual activity: Not on file  Other Topics Concern  . Not on file  Social History Narrative   Patient is right handed, resides alone, consumes caffeine twice daily   Social Determinants of Health   Financial  Resource Strain:   . Difficulty of Paying Living Expenses: Not on file  Food Insecurity:   . Worried About Charity fundraiser in the Last Year: Not on file  . Ran Out of Food in the Last Year: Not on file  Transportation Needs:   . Lack of Transportation (Medical): Not on file  . Lack of Transportation (Non-Medical): Not on file  Physical Activity:   . Days of Exercise per Week: Not on file  . Minutes of Exercise per Session: Not on file  Stress:   . Feeling of Stress : Not on file  Social Connections:   . Frequency of Communication with Friends and Family: Not on file  . Frequency of Social Gatherings with Friends and Family: Not on file  . Attends Religious Services: Not on file  . Active Member of Clubs or Organizations: Not on file  . Attends Archivist Meetings: Not on file  . Marital Status: Not on file  Intimate Partner Violence:   . Fear of Current or Ex-Partner: Not on file  . Emotionally Abused: Not on file  . Physically Abused: Not on file  . Sexually Abused: Not on file    Review of Systems  Constitutional: Negative for fatigue.  Eyes: Negative for visual disturbance.  Respiratory: Negative for cough, chest tightness and shortness of breath.   Cardiovascular: Negative for chest pain, palpitations and leg swelling.  Gastrointestinal: Negative for abdominal pain and blood in stool.  Neurological: Positive for light-headedness (brief as above. ). Negative for dizziness and headaches.     Objective:   Vitals:   09/08/20 0915 09/08/20 0919  BP: (!) 143/63 124/70  Pulse: 60   Temp: 97.8 F (36.6 C)   TempSrc: Temporal   SpO2: 95%   Weight: 152 lb (68.9 kg)   Height: 5' 6.5" (1.689 m)      Physical Exam Vitals reviewed.  Constitutional:      Appearance: He is well-developed.  HENT:     Head: Normocephalic and atraumatic.  Eyes:     Pupils: Pupils are equal, round, and reactive to light.  Neck:     Vascular: No carotid bruit or JVD.    Cardiovascular:     Rate and Rhythm: Normal rate and regular rhythm.     Heart sounds: Murmur heard.      Comments: 3 out of 6 to 4 out of 6 murmur, similar to prior exam.  Regular rhythm with intermittent skipped beat. Pulmonary:     Effort: Pulmonary effort is normal.     Breath sounds: Normal breath sounds. No rales.  Skin:    General: Skin is warm and dry.  Neurological:     Mental Status: He is alert and oriented to person, place, and time.     Assessment & Plan:  Jadrian Bulman is a 86 y.o. male . Essential hypertension - Plan: Lipid panel, Comprehensive metabolic panel  -Check labs.  Stable control.  Rare orthostatic symptoms that quickly resolved, no falls, fall precautions discussed.  There are symptoms looking upward, but declines further evaluation at this time.  Continue same regimen.  Did recommend follow-up with urology if more persistent nocturia.  Need for prophylactic vaccination and inoculation against influenza - Plan: Flu Vaccine QUAD High Dose(Fluad)  Prediabetes - Plan: Hemoglobin A1c  -Check A1c, no new meds at this time.  Hyperlipidemia, unspecified hyperlipidemia type - Plan: Lipid panel, Comprehensive metabolic panel  -He has tolerated Lipitor will continue same dose for now.  If any intolerance, at his age I would discontinue.  No orders of the defined types were placed in this encounter.  Patient Instructions       If you have lab work done today you will be contacted with your lab results within the next 2 weeks.  If you have not heard from [place] then please contact us. The fastest way to get your results is to register for My Chart.   IF you received an x-ray today, you will receive an invoice from [facility]. Please contact [facility] at [phone] with questions or concerns regarding your invoice.   IF you received labwork today, you will receive an invoice from [facility]. Please contact [facility] at [phone] with  questions or concerns regarding your invoice.   Our billing staff will not be able to assist you with questions regarding bills from these companies.  You will be contacted with the lab results as soon as they are available. The fastest way to get your results is to activate your My Chart account. Instructions are located on the last page of this paperwork. If you have not heard from [place] regarding the results in 2 weeks, please contact this office.         Signed, Merri Ray, MD [facility]

## 2020-09-08 NOTE — Patient Instructions (Signed)
° ° ° °  If you have lab work done today you will be contacted with your lab results within the next 2 weeks.  If you have not heard from us then please contact us. The fastest way to get your results is to register for My Chart. ° ° °IF you received an x-ray today, you will receive an invoice from [facility]. Please contact [facility] at [phone] with questions or concerns regarding your invoice.  ° °IF you received labwork today, you will receive an invoice from [facility]. Please contact [facility] at [phone] with questions or concerns regarding your invoice.  ° °Our billing staff will not be able to assist you with questions regarding bills from these companies. ° °You will be contacted with the lab results as soon as they are available. The fastest way to get your results is to activate your My Chart account. Instructions are located on the last page of this paperwork. If you have not heard from us regarding the results in 2 weeks, please contact this office. °  ° ° ° °

## 2020-09-15 ENCOUNTER — Encounter: Payer: Self-pay | Admitting: Radiology

## 2020-09-23 DIAGNOSIS — B351 Tinea unguium: Secondary | ICD-10-CM | POA: Diagnosis not present

## 2020-09-23 DIAGNOSIS — L600 Ingrowing nail: Secondary | ICD-10-CM | POA: Diagnosis not present

## 2020-10-04 DIAGNOSIS — D1801 Hemangioma of skin and subcutaneous tissue: Secondary | ICD-10-CM | POA: Diagnosis not present

## 2020-10-04 DIAGNOSIS — L821 Other seborrheic keratosis: Secondary | ICD-10-CM | POA: Diagnosis not present

## 2020-10-04 DIAGNOSIS — D225 Melanocytic nevi of trunk: Secondary | ICD-10-CM | POA: Diagnosis not present

## 2020-10-04 DIAGNOSIS — Z85828 Personal history of other malignant neoplasm of skin: Secondary | ICD-10-CM | POA: Diagnosis not present

## 2020-10-04 DIAGNOSIS — L905 Scar conditions and fibrosis of skin: Secondary | ICD-10-CM | POA: Diagnosis not present

## 2020-10-04 DIAGNOSIS — L814 Other melanin hyperpigmentation: Secondary | ICD-10-CM | POA: Diagnosis not present

## 2020-10-27 DIAGNOSIS — H524 Presbyopia: Secondary | ICD-10-CM | POA: Diagnosis not present

## 2020-10-27 DIAGNOSIS — H40053 Ocular hypertension, bilateral: Secondary | ICD-10-CM | POA: Diagnosis not present

## 2020-10-27 DIAGNOSIS — H353132 Nonexudative age-related macular degeneration, bilateral, intermediate dry stage: Secondary | ICD-10-CM | POA: Diagnosis not present

## 2020-10-27 DIAGNOSIS — H43813 Vitreous degeneration, bilateral: Secondary | ICD-10-CM | POA: Diagnosis not present

## 2020-11-23 ENCOUNTER — Other Ambulatory Visit: Payer: Self-pay | Admitting: Family Medicine

## 2020-11-23 DIAGNOSIS — I10 Essential (primary) hypertension: Secondary | ICD-10-CM

## 2020-12-06 ENCOUNTER — Ambulatory Visit (INDEPENDENT_AMBULATORY_CARE_PROVIDER_SITE_OTHER): Payer: Medicare Other | Admitting: Family Medicine

## 2020-12-06 VITALS — BP 124/70 | Ht 66.5 in | Wt 152.0 lb

## 2020-12-06 DIAGNOSIS — Z0110 Encounter for examination of ears and hearing without abnormal findings: Secondary | ICD-10-CM | POA: Diagnosis not present

## 2020-12-06 NOTE — Patient Instructions (Addendum)
Thank you for taking time to come for your Medicare Wellness Visit. I appreciate your ongoing commitment to your health goals. Please review the following plan we discussed and let me know if I can assist you in the future.  Briget Shaheed LPN  Preventive Care 84 Years and Older, Male Preventive care refers to lifestyle choices and visits with your health care provider that can promote health and wellness. This includes:  A yearly physical exam. This is also called an annual well check.  Regular dental and eye exams.  Immunizations.  Screening for certain conditions.  Healthy lifestyle choices, such as diet and exercise. What can I expect for my preventive care visit? Physical exam Your health care provider will check:  Height and weight. These may be used to calculate body mass index (BMI), which is a measurement that tells if you are at a healthy weight.  Heart rate and blood pressure.  Your skin for abnormal spots. Counseling Your health care provider may ask you questions about:  Alcohol, tobacco, and drug use.  Emotional well-being.  Home and relationship well-being.  Sexual activity.  Eating habits.  History of falls.  Memory and ability to understand (cognition).  Work and work environment. What immunizations do I need?  Influenza (flu) vaccine  This is recommended every year. Tetanus, diphtheria, and pertussis (Tdap) vaccine  You may need a Td booster every 10 years. Varicella (chickenpox) vaccine  You may need this vaccine if you have not already been vaccinated. Zoster (shingles) vaccine  You may need this after age 84. Pneumococcal conjugate (PCV13) vaccine  One dose is recommended after age 84. Pneumococcal polysaccharide (PPSV23) vaccine  One dose is recommended after age 84. Measles, mumps, and rubella (MMR) vaccine  You may need at least one dose of MMR if you were born in 1957 or later. You may also need a second dose. Meningococcal  conjugate (MenACWY) vaccine  You may need this if you have certain conditions. Hepatitis A vaccine  You may need this if you have certain conditions or if you travel or work in places where you may be exposed to hepatitis A. Hepatitis B vaccine  You may need this if you have certain conditions or if you travel or work in places where you may be exposed to hepatitis B. Haemophilus influenzae type b (Hib) vaccine  You may need this if you have certain conditions. You may receive vaccines as individual doses or as more than one vaccine together in one shot (combination vaccines). Talk with your health care provider about the risks and benefits of combination vaccines. What tests do I need? Blood tests  Lipid and cholesterol levels. These may be checked every 5 years, or more frequently depending on your overall health.  Hepatitis C test.  Hepatitis B test. Screening  Lung cancer screening. You may have this screening every year starting at age 84 if you have a 30-pack-year history of smoking and currently smoke or have quit within the past 15 years.  Colorectal cancer screening. All adults should have this screening starting at age 84 and continuing until age 75. Your health care provider may recommend screening at age 45 if you are at increased risk. You will have tests every 1-10 years, depending on your results and the type of screening test.  Prostate cancer screening. Recommendations will vary depending on your family history and other risks.  Diabetes screening. This is done by checking your blood sugar (glucose) after you have not eaten for   a while (fasting). You may have this done every 1-3 years.  Abdominal aortic aneurysm (AAA) screening. You may need this if you are a current or former smoker.  Sexually transmitted disease (STD) testing. Follow these instructions at home: Eating and drinking  Eat a diet that includes fresh fruits and vegetables, whole grains, lean  protein, and low-fat dairy products. Limit your intake of foods with high amounts of sugar, saturated fats, and salt.  Take vitamin and mineral supplements as recommended by your health care provider.  Do not drink alcohol if your health care provider tells you not to drink.  If you drink alcohol: ? Limit how much you have to 0-2 drinks a day. ? Be aware of how much alcohol is in your drink. In the [place] one drink equals one 12 oz bottle of beer (355 mL), one 5 oz glass of wine (148 mL), or one 1 oz glass of hard liquor (44 mL). Lifestyle  Take daily care of your teeth and gums.  Stay active. Exercise for at least 30 minutes on 5 or more days each week.  Do not use any products that contain nicotine or tobacco, such as cigarettes, e-cigarettes, and chewing tobacco. If you need help quitting, ask your health care provider.  If you are sexually active, practice safe sex. Use a condom or other form of protection to prevent STIs (sexually transmitted infections).  Talk with your health care provider about taking a low-dose aspirin or statin. What's next?  Visit your health care provider once a year for a well check visit.  Ask your health care provider how often you should have your eyes and teeth checked.  Stay up to date on all vaccines. This information is not intended to replace advice given to you by your health care provider. Make sure you discuss any questions you have with your health care provider. Document Revised: 12/05/2018 Document Reviewed: 12/05/2018 Elsevier Patient Education  2020 [employer].

## 2020-12-06 NOTE — Progress Notes (Signed)
Presents today for [employer] Visit   Date of last exam:03-08-2020  Interpreter used for this visit? No  I connected with  Nicholas Finley on 12/06/20 by a telephone application and verified that I am speaking with the correct person using two identifiers.   I discussed the limitations of evaluation and management by telemedicine. The patient expressed understanding and agreed to proceed.  Patient location: home  Provider location: in office  I provided 20 minutes of non face - to - face time during this encounter.   Patient Care Team: [name], MD as PCP - General (Family Medicine)   Other items to address today:  Discussed Eye/Dental Discussed Immunizations Follow up scheduled 3/16@ 9:20 Dr. Carlota Raspberry   Other Screening: Last screening for diabetes: 09/08/2020 Last lipid screening: 09/08/2020  ADVANCE DIRECTIVES: Discussed: no On File: yes Materials Provided: no  Immunization status:  Immunization History  Administered Date(s) Administered  . Fluad Quad(high Dose 65+) 09/08/2020  . Influenza,inj,Quad PF,6+ Mos 09/05/2013, 11/30/2014, 12/06/2015  . Influenza-Unspecified 09/26/2016  . PFIZER SARS-COV-2 Vaccination 03/25/2020, 04/21/2020  . Pneumococcal Conjugate-13 11/30/2014  . Pneumococcal-Unspecified 07/26/2007     Health Maintenance Due  Topic Date Due  . TETANUS/TDAP  Never done  . COVID-19 Vaccine (3 - Booster for Pfizer series) 10/21/2020     Functional Status Survey: Is the patient deaf or have difficulty hearing?: Yes (wears a hearing he bought at store) Does the patient have difficulty seeing, even when wearing glasses/contacts?: No Does the patient have difficulty concentrating, remembering, or making decisions?: No Does the patient have difficulty walking or climbing stairs?: No (goes real slow needs to hold on to railings) Does the patient have difficulty dressing or bathing?: No Does the patient have difficulty  doing errands alone such as visiting a doctor's office or shopping?: No   6CIT Screen 12/06/2020 07/28/2019  What Year? 0 points 0 points  What month? 0 points 0 points  What time? 0 points 0 points  Count back from 20 0 points 0 points  Months in reverse 0 points 0 points  Repeat phrase 0 points 0 points  Total Score 0 0      Flowsheet Row Clinical Support from 12/06/2020 in [facility] [place]  AUDIT-C Score 2       Home Environment:   Lives in a one story home No scattered rugs  Yes grab bars Adequate lighting/ no clutter No trouble climbing stairs just needs to go slow   Patient Active Problem List   Diagnosis Date Noted  . HTN (hypertension) 03/01/2012  . Hyperlipidemia 03/01/2012  . Hearing difficulty 03/01/2012  . Allergic rhinitis 03/01/2012     Past Medical History:  Diagnosis Date  . Allergy   . Cataract   . Enlarged prostate   . Heart murmur   . Hypercholesteremia   . Hypertension      Past Surgical History:  Procedure Laterality Date  . CYST EXCISION     pilonidal  . SKIN CANCER EXCISION       Family History  Problem Relation Age of Onset  . Heart failure Father   . Heart Problems Sister      Social History   Socioeconomic History  . Marital status: Single    Spouse name: Not on file  . Number of children: 0  . Years of education: 83  . Highest education level: Not on file  Occupational History    Comment: retired  Tobacco Use  .  Smoking status: Current Some Day Smoker    Types: Cigarettes, Pipe, Cigars  . Smokeless tobacco: Never Used  . Tobacco comment: cigars   Vaping Use  . Vaping Use: Never used  Substance and Sexual Activity  . Alcohol use: Yes    Alcohol/week: 0.0 standard drinks    Comment: occas beer  . Drug use: No  . Sexual activity: Not on file  Other Topics Concern  . Not on file  Social History Narrative   Patient is right handed, resides alone, consumes caffeine twice daily   Social Determinants of  Health   Financial Resource Strain: Not on file  Food Insecurity: Not on file  Transportation Needs: Not on file  Physical Activity: Not on file  Stress: Not on file  Social Connections: Not on file  Intimate Partner Violence: Not on file     No Known Allergies   Prior to Admission medications   Medication Sig Start Date End Date Taking? Authorizing Provider  aspirin 81 MG tablet Take 81 mg by mouth daily.   Yes [provider]  atorvastatin (LIPITOR) 10 MG tablet Take 1 tablet (10 mg total) by mouth daily. 03/08/20  Yes [name], MD  doxazosin (CARDURA) 1 MG tablet TAKE ONE TABLET BY MOUTH DAILY 08/25/20  Yes [name], MD  lisinopril (ZESTRIL) 10 MG tablet TAKE ONE TABLET BY MOUTH DAILY 11/23/20  Yes [name], MD  Azelastine HCl (ASTEPRO) 0.15 % SOLN instill 1 spray into each nostril twice a day if needed Patient not taking: Reported on 12/06/2020 03/10/19   [name], MD     Depression screen [facility] 2/9 12/06/2020 09/08/2020 03/08/2020 11/10/2019 09/29/2019  Decreased Interest 0 0 0 0 0  Down, Depressed, Hopeless 0 0 0 0 0  PHQ - 2 Score 0 0 0 0 0     Fall Risk  12/06/2020 09/08/2020 03/08/2020 11/10/2019 09/29/2019  Falls in the past year? 0 0 0 0 0  Comment - - - - -  Number falls in past yr: 0 - - 0 0  Injury with Fall? 0 - - 0 0  Comment - - - - -  Risk Factor Category  - - - - -  Risk for fall due to : History of fall(s) - - - -  Follow up Falls evaluation completed;Education provided Falls evaluation completed Falls evaluation completed Falls evaluation completed -      PHYSICAL EXAM: BP 124/70 Comment: taken from a previous visit/not in clinic  Ht 5' 6.5" (1.689 m)   Wt 152 lb (68.9 kg)   BMI 24.17 kg/m    Wt Readings from Last 3 Encounters:  12/06/20 152 lb (68.9 kg)  09/08/20 152 lb (68.9 kg)  03/08/20 161 lb (73 kg)       Education/Counseling provided regarding diet and exercise, prevention of chronic diseases,  smoking/tobacco cessation, if applicable, and reviewed "Covered Medicare Preventive Services."   ASSESSMENT/PLAN:  1. Normal hearing exam - Ambulatory referral to Audiology

## 2020-12-14 DIAGNOSIS — Z23 Encounter for immunization: Secondary | ICD-10-CM | POA: Diagnosis not present

## 2020-12-21 DIAGNOSIS — L97512 Non-pressure chronic ulcer of other part of right foot with fat layer exposed: Secondary | ICD-10-CM | POA: Diagnosis not present

## 2020-12-21 DIAGNOSIS — M79675 Pain in left toe(s): Secondary | ICD-10-CM | POA: Diagnosis not present

## 2020-12-21 DIAGNOSIS — L600 Ingrowing nail: Secondary | ICD-10-CM | POA: Diagnosis not present

## 2020-12-21 DIAGNOSIS — M79674 Pain in right toe(s): Secondary | ICD-10-CM | POA: Diagnosis not present

## 2020-12-21 DIAGNOSIS — B351 Tinea unguium: Secondary | ICD-10-CM | POA: Diagnosis not present

## 2021-01-04 DIAGNOSIS — L97512 Non-pressure chronic ulcer of other part of right foot with fat layer exposed: Secondary | ICD-10-CM | POA: Diagnosis not present

## 2021-01-18 DIAGNOSIS — L97512 Non-pressure chronic ulcer of other part of right foot with fat layer exposed: Secondary | ICD-10-CM | POA: Diagnosis not present

## 2021-02-01 DIAGNOSIS — L97512 Non-pressure chronic ulcer of other part of right foot with fat layer exposed: Secondary | ICD-10-CM | POA: Diagnosis not present

## 2021-02-15 DIAGNOSIS — L97512 Non-pressure chronic ulcer of other part of right foot with fat layer exposed: Secondary | ICD-10-CM | POA: Diagnosis not present

## 2021-02-21 ENCOUNTER — Other Ambulatory Visit: Payer: Self-pay | Admitting: Family Medicine

## 2021-02-21 DIAGNOSIS — E785 Hyperlipidemia, unspecified: Secondary | ICD-10-CM

## 2021-02-21 DIAGNOSIS — R351 Nocturia: Secondary | ICD-10-CM

## 2021-02-21 DIAGNOSIS — I10 Essential (primary) hypertension: Secondary | ICD-10-CM

## 2021-03-01 DIAGNOSIS — L97511 Non-pressure chronic ulcer of other part of right foot limited to breakdown of skin: Secondary | ICD-10-CM | POA: Diagnosis not present

## 2021-03-09 ENCOUNTER — Other Ambulatory Visit: Payer: Self-pay

## 2021-03-09 ENCOUNTER — Encounter: Payer: Self-pay | Admitting: Family Medicine

## 2021-03-09 ENCOUNTER — Ambulatory Visit (INDEPENDENT_AMBULATORY_CARE_PROVIDER_SITE_OTHER): Payer: Medicare Other | Admitting: Family Medicine

## 2021-03-09 VITALS — BP 130/62 | HR 76 | Temp 98.3°F | Ht 65.5 in | Wt 148.0 lb

## 2021-03-09 DIAGNOSIS — R351 Nocturia: Secondary | ICD-10-CM

## 2021-03-09 DIAGNOSIS — R7303 Prediabetes: Secondary | ICD-10-CM

## 2021-03-09 DIAGNOSIS — E785 Hyperlipidemia, unspecified: Secondary | ICD-10-CM | POA: Diagnosis not present

## 2021-03-09 DIAGNOSIS — I10 Essential (primary) hypertension: Secondary | ICD-10-CM

## 2021-03-09 DIAGNOSIS — R42 Dizziness and giddiness: Secondary | ICD-10-CM

## 2021-03-09 NOTE — Patient Instructions (Addendum)
Can try stopping doxazosin to see if that is causing dizziness, but may need to follow up with your urologist to discuss other options for nighttime urinary symptoms.  If dizziness not improving, will need to look at other causes - return if dizziness continues.   Recheck in 3 months for blood pressure. Keep a record of your blood pressures outside of the office and bring them to the next office visit. If readings over 150/90 - return sooner.   Return to the clinic or go to the nearest emergency room if any of your symptoms worsen or new symptoms occur.  Here are a few lab drawing stations for you to have your labwork performed:  [place], [place], [place] [address]  [place], [place] [address]    If you have lab work done today you will be contacted with your lab results within the next 2 weeks.  If you have not heard from [place] then please contact us. The fastest way to get your results is to register for My Chart.  Dizziness Dizziness is a common problem. It is a feeling of unsteadiness or light-headedness. You may feel like you are about to faint. Dizziness can lead to injury if you stumble or fall. Anyone can become dizzy, but dizziness is more common in older adults. This condition can be caused by a number of things, including medicines, dehydration, or illness. Follow these instructions at home: Eating and drinking  Drink enough fluid to keep your urine clear or pale yellow. This helps to keep you from becoming dehydrated. Try to drink more clear fluids, such as water.  Do not drink alcohol.  Limit your caffeine intake if told to do so by your health care provider. Check ingredients and nutrition facts to see if a food or beverage contains caffeine.  Limit your salt (sodium) intake if told to do so by your health care provider. Check ingredients and nutrition facts to see if a food or beverage contains sodium. Activity  Avoid making  quick movements. ? Rise slowly from chairs and steady yourself until you feel okay. ? In the morning, first sit up on the side of the bed. When you feel okay, stand slowly while you hold onto something until you know that your balance is fine.  If you need to stand in one place for a long time, move your legs often. Tighten and relax the muscles in your legs while you are standing.  Do not drive or use heavy machinery if you feel dizzy.  Avoid bending down if you feel dizzy. Place items in your home so that they are easy for you to reach without leaning over. Lifestyle  Do not use any products that contain nicotine or tobacco, such as cigarettes and e-cigarettes. If you need help quitting, ask your health care provider.  Try to reduce your stress level by using methods such as yoga or meditation. Talk with your health care provider if you need help to manage your stress. General instructions  Watch your dizziness for any changes.  Take over-the-counter and prescription medicines only as told by your health care provider. Talk with your health care provider if you think that your dizziness is caused by a medicine that you are taking.  Tell a friend or a family member that you are feeling dizzy. If he or she notices any changes in your behavior, have this person call your health care provider.  Keep all follow-up  visits as told by your health care provider. This is important. Contact a health care provider if:  Your dizziness does not go away.  Your dizziness or light-headedness gets worse.  You feel nauseous.  You have reduced hearing.  You have new symptoms.  You are unsteady on your feet or you feel like the room is spinning. Get help right away if:  You vomit or have diarrhea and are unable to eat or drink anything.  You have problems talking, walking, swallowing, or using your arms, hands, or legs.  You feel generally weak.  You are not thinking clearly or you have  trouble forming sentences. It may take a friend or family member to notice this.  You have chest pain, abdominal pain, shortness of breath, or sweating.  Your vision changes.  You have any bleeding.  You have a severe headache.  You have neck pain or a stiff neck.  You have a fever. These symptoms may represent a serious problem that is an emergency. Do not wait to see if the symptoms will go away. Get medical help right away. Call your local emergency services (911 in the [place] Do not drive yourself to the hospital. Summary  Dizziness is a feeling of unsteadiness or light-headedness. This condition can be caused by a number of things, including medicines, dehydration, or illness.  Anyone can become dizzy, but dizziness is more common in older adults.  Drink enough fluid to keep your urine clear or pale yellow. Do not drink alcohol.  Avoid making quick movements if you feel dizzy. Monitor your dizziness for any changes. This information is not intended to replace advice given to you by your health care provider. Make sure you discuss any questions you have with your health care provider. Document Revised: 12/14/2017 Document Reviewed: 01/13/2017 Elsevier Patient Education  2021 [employer].   IF you received an x-ray today, you will receive an invoice from [facility]. Please contact [facility] at [phone] with questions or concerns regarding your invoice.   IF you received labwork today, you will receive an invoice from [place]. Please contact [facility] at [phone] with questions or concerns regarding your invoice.   Our billing staff will not be able to assist you with questions regarding bills from these companies.  You will be contacted with the lab results as soon as they are available. The fastest way to get your results is to activate your My Chart account. Instructions are located on the last page of this paperwork. If you have not heard from [place]  regarding the results in 2 weeks, please contact this office.

## 2021-03-09 NOTE — Progress Notes (Signed)
Subjective:  Patient ID: Nicholas Finley, male    DOB: Jan 22, 1926  Age: 87 y.o. MRN: [id number]  CC:  Chief Complaint  Patient presents with   Follow-up    On hypertension.pre-diabetes, and hyperlipidemia. Pt reports not checking his BP at home due to forgetting. Pt reports no physical symptoms that he has noticed other than dizziness when going up stairs. Pt reports the only sugar foods he eats are Dates, orange juice and the occasional gingersnap(2-3x a week). Pt reports that he doesn't have any complaints for today's visit.    HPI Nicholas Finley presents for   Hypertension: Lisinopril 10 mg daily, doxazosin 1 mg daily - also for BPH.  No change in nocturia with doxazosin.   Dizziness with stairs without railing only - no dizziness at home. Dizzy if on unsteady surface.   Dizziness has been discussed previously but declined cardiology eval/vascular eval.  Chronic small vessel disease noted on previous brain MRI in 2015. Walking in the past - 1/4-1/2 mile during cold weather. Using cane. No recent falls.  Home readings:none.  BP Readings from Last 3 Encounters:  03/09/21 130/62  12/06/20 124/70  09/08/20 124/70   Lab Results  Component Value Date   CREATININE 0.68 (L) 09/08/2020   Prediabetes: watching diet.  Still watching diet as above, walking as above.   Lab Results  Component Value Date   HGBA1C 5.8 (H) 09/08/2020   Wt Readings from Last 3 Encounters:  03/09/21 148 lb (67.1 kg)  12/06/20 152 lb (68.9 kg)  09/08/20 152 lb (68.9 kg)   Hyperlipidemia: Lipitor 10 mg daily. No new myalgias.  Lab Results  Component Value Date   CHOL 124 09/08/2020   HDL 51 09/08/2020   LDLCALC 62 09/08/2020   TRIG 48 09/08/2020   CHOLHDL 2.4 09/08/2020   Lab Results  Component Value Date   ALT 11 09/08/2020   AST 13 09/08/2020   ALKPHOS 64 09/08/2020   BILITOT 1.1 09/08/2020    History Patient Active Problem List   Diagnosis Date Noted   HTN (hypertension)  03/01/2012   Hyperlipidemia 03/01/2012   Hearing difficulty 03/01/2012   Allergic rhinitis 03/01/2012   Past Medical History:  Diagnosis Date   Allergy    Cataract    Enlarged prostate    Heart murmur    Hypercholesteremia    Hypertension    Past Surgical History:  Procedure Laterality Date   CYST EXCISION     pilonidal   SKIN CANCER EXCISION     No Known Allergies Prior to Admission medications   Medication Sig Start Date End Date Taking? Authorizing Provider  aspirin 81 MG tablet Take 81 mg by mouth daily.   Yes [provider]  atorvastatin (LIPITOR) 10 MG tablet TAKE ONE TABLET BY MOUTH DAILY 02/21/21  Yes [name], MD  Azelastine HCl (ASTEPRO) 0.15 % SOLN instill 1 spray into each nostril twice a day if needed 03/10/19  Yes [name], MD  doxazosin (CARDURA) 1 MG tablet TAKE ONE TABLET BY MOUTH DAILY 02/21/21  Yes [name], MD  lisinopril (ZESTRIL) 10 MG tablet TAKE ONE TABLET BY MOUTH DAILY 02/21/21  Yes [name], MD   Social History   Socioeconomic History   Marital status: Single    Spouse name: Not on file   Number of children: 0   Years of education: 57   Highest education level: Not on file  Occupational History    Comment: retired  Tobacco  Use   Smoking status: Current Some Day Smoker    Types: Cigarettes, Pipe, Cigars   Smokeless tobacco: Never Used   Tobacco comment: cigars   Vaping Use   Vaping Use: Never used  Substance and Sexual Activity   Alcohol use: Yes    Alcohol/week: 0.0 standard drinks    Comment: occas beer   Drug use: No   Sexual activity: Not on file  Other Topics Concern   Not on file  Social History Narrative   Patient is right handed, resides alone, consumes caffeine twice daily   Social Determinants of Health   Financial Resource Strain: Not on file  Food Insecurity: Not on file  Transportation Needs: Not on file  Physical Activity: Not on file  Stress: Not on  file  Social Connections: Not on file  Intimate Partner Violence: Not on file    Review of Systems  Constitutional: Negative for fatigue and unexpected weight change.  Eyes: Negative for visual disturbance.  Respiratory: Negative for cough, chest tightness and shortness of breath.   Cardiovascular: Negative for chest pain, palpitations and leg swelling.  Gastrointestinal: Negative for abdominal pain and blood in stool.  Neurological: Positive for dizziness. Negative for light-headedness and headaches.     Objective:   Vitals:   03/09/21 0938 03/09/21 0943  BP: (!) 171/74 130/62  Pulse: 76   Temp: 98.3 F (36.8 C)   TempSrc: Temporal   SpO2: 97%   Weight: 148 lb (67.1 kg)   Height: 5' 5.5" (1.664 m)     Physical Exam Vitals reviewed.  Constitutional:      Appearance: He is well-developed.  HENT:     Head: Normocephalic and atraumatic.  Eyes:     Extraocular Movements: Extraocular movements intact.     Pupils: Pupils are equal, round, and reactive to light.     Comments: No nystagmus.   Neck:     Vascular: No carotid bruit or JVD.  Cardiovascular:     Rate and Rhythm: Normal rate and regular rhythm.     Heart sounds: Murmur (systolic) heard.    Pulmonary:     Effort: Pulmonary effort is normal.     Breath sounds: Normal breath sounds. No rales.  Skin:    General: Skin is warm and dry.  Neurological:     General: No focal deficit present.     Mental Status: He is alert and oriented to person, place, and time.     Comments: Ambulates with cane.   Psychiatric:        Mood and Affect: Mood normal.        Behavior: Behavior normal.      Assessment & Plan:  Nicholas Finley is a 87 y.o. male . Essential hypertension - Plan: Comprehensive metabolic panel, CBC  -  Stable, tolerating current regimen. Medications recently refilled. Labs pending as above.   Prediabetes - Plan: Hemoglobin A1c  - continue to monitor.   Hyperlipidemia, unspecified hyperlipidemia  type - Plan: Lipid panel  -  Stable, tolerating current regimen. Medications recently refilled. Labs pending as above.   Nocturia  - stable, unsure if doxazosin helping and episodic dizziness. Trial off doxazosin, but follow up recommended with urology if persistent or worsening off doxazosin.  Episode of dizziness  - as above- trial off doxazosin, but if persists rtc to look into other causes - potentially cardiology eval with hx of murmur. Understanding expressed   No orders of the defined types were placed in this encounter.  Patient Instructions   Can try stopping doxazosin to see if that is causing dizziness, but may need to follow up with your urologist to discuss other options for nighttime urinary symptoms.  If dizziness not improving, will need to look at other causes - return if dizziness continues.   Recheck in 3 months for blood pressure. Keep a record of your blood pressures outside of the office and bring them to the next office visit. If readings over 150/90 - return sooner.   Return to the clinic or go to the nearest emergency room if any of your symptoms worsen or new symptoms occur.  Here are a few lab drawing stations for you to have your labwork performed:  [place], [place], [place] [address]  [place], [place] [address]    If you have lab work done today you will be contacted with your lab results within the next 2 weeks.  If you have not heard from [place] then please contact us. The fastest way to get your results is to register for My Chart.  Dizziness Dizziness is a common problem. It is a feeling of unsteadiness or light-headedness. You may feel like you are about to faint. Dizziness can lead to injury if you stumble or fall. Anyone can become dizzy, but dizziness is more common in older adults. This condition can be caused by a number of things, including medicines, dehydration, or illness. Follow these instructions  at home: Eating and drinking  Drink enough fluid to keep your urine clear or pale yellow. This helps to keep you from becoming dehydrated. Try to drink more clear fluids, such as water.  Do not drink alcohol.  Limit your caffeine intake if told to do so by your health care provider. Check ingredients and nutrition facts to see if a food or beverage contains caffeine.  Limit your salt (sodium) intake if told to do so by your health care provider. Check ingredients and nutrition facts to see if a food or beverage contains sodium. Activity  Avoid making quick movements. ? Rise slowly from chairs and steady yourself until you feel okay. ? In the morning, first sit up on the side of the bed. When you feel okay, stand slowly while you hold onto something until you know that your balance is fine.  If you need to stand in one place for a long time, move your legs often. Tighten and relax the muscles in your legs while you are standing.  Do not drive or use heavy machinery if you feel dizzy.  Avoid bending down if you feel dizzy. Place items in your home so that they are easy for you to reach without leaning over. Lifestyle  Do not use any products that contain nicotine or tobacco, such as cigarettes and e-cigarettes. If you need help quitting, ask your health care provider.  Try to reduce your stress level by using methods such as yoga or meditation. Talk with your health care provider if you need help to manage your stress. General instructions  Watch your dizziness for any changes.  Take over-the-counter and prescription medicines only as told by your health care provider. Talk with your health care provider if you think that your dizziness is caused by a medicine that you are taking.  Tell a friend or a family member that you are feeling dizzy. If he or she notices any changes in your behavior, have this person call your health care provider.  Keep all follow-up visits as told by your  health care provider. This is important. Contact a health care provider if:  Your dizziness does not go away.  Your dizziness or light-headedness gets worse.  You feel nauseous.  You have reduced hearing.  You have new symptoms.  You are unsteady on your feet or you feel like the room is spinning. Get help right away if:  You vomit or have diarrhea and are unable to eat or drink anything.  You have problems talking, walking, swallowing, or using your arms, hands, or legs.  You feel generally weak.  You are not thinking clearly or you have trouble forming sentences. It may take a friend or family member to notice this.  You have chest pain, abdominal pain, shortness of breath, or sweating.  Your vision changes.  You have any bleeding.  You have a severe headache.  You have neck pain or a stiff neck.  You have a fever. These symptoms may represent a serious problem that is an emergency. Do not wait to see if the symptoms will go away. Get medical help right away. Call your local emergency services (911 in the [place] Do not drive yourself to the hospital. Summary  Dizziness is a feeling of unsteadiness or light-headedness. This condition can be caused by a number of things, including medicines, dehydration, or illness.  Anyone can become dizzy, but dizziness is more common in older adults.  Drink enough fluid to keep your urine clear or pale yellow. Do not drink alcohol.  Avoid making quick movements if you feel dizzy. Monitor your dizziness for any changes. This information is not intended to replace advice given to you by your health care provider. Make sure you discuss any questions you have with your health care provider. Document Revised: 12/14/2017 Document Reviewed: 01/13/2017 Elsevier Patient Education  2021 [place].   IF you received an x-ray today, you will receive an invoice from [facility]. Please contact [facility] at [phone]  with questions or concerns regarding your invoice.   IF you received labwork today, you will receive an invoice from [place]. Please contact [facility] at [phone] with questions or concerns regarding your invoice.   Our billing staff will not be able to assist you with questions regarding bills from these companies.  You will be contacted with the lab results as soon as they are available. The fastest way to get your results is to activate your My Chart account. Instructions are located on the last page of this paperwork. If you have not heard from [place] regarding the results in 2 weeks, please contact this office.         Signed, Merri Ray, MD [facility]

## 2021-03-10 ENCOUNTER — Encounter: Payer: Self-pay | Admitting: Family Medicine

## 2021-03-16 DIAGNOSIS — E785 Hyperlipidemia, unspecified: Secondary | ICD-10-CM | POA: Diagnosis not present

## 2021-03-16 DIAGNOSIS — R7303 Prediabetes: Secondary | ICD-10-CM | POA: Diagnosis not present

## 2021-03-16 DIAGNOSIS — I10 Essential (primary) hypertension: Secondary | ICD-10-CM | POA: Diagnosis not present

## 2021-03-16 LAB — HEMOGLOBIN A1C
Est. average glucose Bld gHb Est-mCnc: 120 mg/dL
Hgb A1c MFr Bld: 5.8 % — ABNORMAL HIGH (ref 4.8–5.6)

## 2021-03-16 LAB — COMPREHENSIVE METABOLIC PANEL
ALT: 20 IU/L (ref 0–44)
AST: 23 IU/L (ref 0–40)
Albumin/Globulin Ratio: 1.6 (ref 1.2–2.2)
Albumin: 4.6 g/dL (ref 3.5–4.6)
Alkaline Phosphatase: 73 IU/L (ref 44–121)
BUN/Creatinine Ratio: 20 (ref 10–24)
BUN: 14 mg/dL (ref 10–36)
Bilirubin Total: 1.1 mg/dL (ref 0.0–1.2)
CO2: 21 mmol/L (ref 20–29)
Calcium: 9.7 mg/dL (ref 8.6–10.2)
Chloride: 102 mmol/L (ref 96–106)
Creatinine, Ser: 0.7 mg/dL — ABNORMAL LOW (ref 0.76–1.27)
Globulin, Total: 2.8 g/dL (ref 1.5–4.5)
Glucose: 90 mg/dL (ref 65–99)
Potassium: 4.7 mmol/L (ref 3.5–5.2)
Sodium: 141 mmol/L (ref 134–144)
Total Protein: 7.4 g/dL (ref 6.0–8.5)
eGFR: 86 mL/min/{1.73_m2} (ref >59)

## 2021-03-16 LAB — LIPID PANEL
Chol/HDL Ratio: 2.2 ratio (ref 0.0–5.0)
Cholesterol, Total: 147 mg/dL (ref 100–199)
HDL: 66 mg/dL (ref >39)
LDL Chol Calc (NIH): 69 mg/dL (ref 0–99)
Triglycerides: 58 mg/dL (ref 0–149)
VLDL Cholesterol Cal: 12 mg/dL (ref 5–40)

## 2021-03-16 LAB — CBC
Hematocrit: 40.2 % (ref 37.5–51.0)
Hemoglobin: 13.5 g/dL (ref 13.0–17.7)
MCH: 31.4 pg (ref 26.6–33.0)
MCHC: 33.6 g/dL (ref 31.5–35.7)
MCV: 94 fL (ref 79–97)
Platelets: 307 10*3/uL (ref 150–450)
RBC: 4.3 x10E6/uL (ref 4.14–5.80)
RDW: 12.2 % (ref 11.6–15.4)
WBC: 10.9 10*3/uL — ABNORMAL HIGH (ref 3.4–10.8)

## 2021-03-29 DIAGNOSIS — L600 Ingrowing nail: Secondary | ICD-10-CM | POA: Diagnosis not present

## 2021-05-10 DIAGNOSIS — L97512 Non-pressure chronic ulcer of other part of right foot with fat layer exposed: Secondary | ICD-10-CM | POA: Diagnosis not present

## 2021-05-25 DIAGNOSIS — B079 Viral wart, unspecified: Secondary | ICD-10-CM | POA: Diagnosis not present

## 2021-05-25 DIAGNOSIS — L97511 Non-pressure chronic ulcer of other part of right foot limited to breakdown of skin: Secondary | ICD-10-CM | POA: Diagnosis not present

## 2021-06-08 DIAGNOSIS — M24574 Contracture, right foot: Secondary | ICD-10-CM | POA: Diagnosis not present

## 2021-06-22 DIAGNOSIS — B079 Viral wart, unspecified: Secondary | ICD-10-CM | POA: Diagnosis not present

## 2021-07-06 DIAGNOSIS — B079 Viral wart, unspecified: Secondary | ICD-10-CM | POA: Diagnosis not present

## 2021-07-20 DIAGNOSIS — B079 Viral wart, unspecified: Secondary | ICD-10-CM | POA: Diagnosis not present

## 2021-08-19 ENCOUNTER — Other Ambulatory Visit: Payer: Self-pay | Admitting: Family Medicine

## 2021-08-19 DIAGNOSIS — E785 Hyperlipidemia, unspecified: Secondary | ICD-10-CM

## 2021-08-19 DIAGNOSIS — I10 Essential (primary) hypertension: Secondary | ICD-10-CM

## 2021-08-20 ENCOUNTER — Other Ambulatory Visit: Payer: Self-pay | Admitting: Family Medicine

## 2021-08-20 DIAGNOSIS — E785 Hyperlipidemia, unspecified: Secondary | ICD-10-CM

## 2021-08-20 DIAGNOSIS — I10 Essential (primary) hypertension: Secondary | ICD-10-CM

## 2021-09-07 ENCOUNTER — Encounter: Payer: Self-pay | Admitting: Family Medicine

## 2021-09-07 ENCOUNTER — Ambulatory Visit (INDEPENDENT_AMBULATORY_CARE_PROVIDER_SITE_OTHER): Payer: Medicare Other | Admitting: Family Medicine

## 2021-09-07 ENCOUNTER — Other Ambulatory Visit: Payer: Self-pay

## 2021-09-07 VITALS — BP 136/64 | HR 68 | Temp 98.2°F | Resp 15 | Ht 65.5 in | Wt 149.0 lb

## 2021-09-07 DIAGNOSIS — E785 Hyperlipidemia, unspecified: Secondary | ICD-10-CM

## 2021-09-07 DIAGNOSIS — Z23 Encounter for immunization: Secondary | ICD-10-CM | POA: Diagnosis not present

## 2021-09-07 DIAGNOSIS — R351 Nocturia: Secondary | ICD-10-CM | POA: Diagnosis not present

## 2021-09-07 DIAGNOSIS — R7303 Prediabetes: Secondary | ICD-10-CM | POA: Diagnosis not present

## 2021-09-07 DIAGNOSIS — I10 Essential (primary) hypertension: Secondary | ICD-10-CM | POA: Diagnosis not present

## 2021-09-07 DIAGNOSIS — R42 Dizziness and giddiness: Secondary | ICD-10-CM

## 2021-09-07 LAB — LIPID PANEL
Cholesterol: 141 mg/dL (ref 0–200)
HDL: 59.6 mg/dL (ref >39.00)
LDL Cholesterol: 69 mg/dL (ref 0–99)
NonHDL: 81.29
Total CHOL/HDL Ratio: 2
Triglycerides: 61 mg/dL (ref 0.0–149.0)
VLDL: 12.2 mg/dL (ref 0.0–40.0)

## 2021-09-07 LAB — COMPREHENSIVE METABOLIC PANEL
ALT: 15 U/L (ref 0–53)
AST: 16 U/L (ref 0–37)
Albumin: 3.9 g/dL (ref 3.5–5.2)
Alkaline Phosphatase: 58 U/L (ref 39–117)
BUN: 15 mg/dL (ref 6–23)
CO2: 27 mEq/L (ref 19–32)
Calcium: 9.2 mg/dL (ref 8.4–10.5)
Chloride: 102 mEq/L (ref 96–112)
Creatinine, Ser: 0.62 mg/dL (ref 0.40–1.50)
GFR: 82.41 mL/min (ref >60.00)
Glucose, Bld: 72 mg/dL (ref 70–99)
Potassium: 4 mEq/L (ref 3.5–5.1)
Sodium: 139 mEq/L (ref 135–145)
Total Bilirubin: 1.3 mg/dL — ABNORMAL HIGH (ref 0.2–1.2)
Total Protein: 6.9 g/dL (ref 6.0–8.3)

## 2021-09-07 LAB — HEMOGLOBIN A1C: Hgb A1c MFr Bld: 6 % (ref 4.6–6.5)

## 2021-09-07 MED ORDER — LISINOPRIL 10 MG PO TABS: 10.0000 mg | ORAL_TABLET | Freq: Every day | ORAL | 1 refills | Status: DC

## 2021-09-07 MED ORDER — ATORVASTATIN CALCIUM 10 MG PO TABS: 10.0000 mg | ORAL_TABLET | Freq: Every day | ORAL | 1 refills | Status: DC

## 2021-09-07 NOTE — Patient Instructions (Addendum)
Dizziness with looking up could be due to blood vessel issue. Let me know if you would like to meet with specialist to evaluate this further. Continue to use cane. See fall precautions below.   If no change off doxazosin with urinary symptoms, then no need to restart at this time. I would recommend discussing other options with your urologist. Let me know if I can help further.   No med changes today. You have option to stop aspirin, but with unknown cause of symptoms in 2015 it is reasonable to continue as well. Let me know if there are further questions.   Fall Prevention in the Home, Adult Falls can cause injuries and can affect people from all age groups. There are many simple things that you can do to make your home safe and to help prevent falls. Ask for help when making these changes, if needed. What actions can I take to prevent falls? General instructions Use good lighting in all rooms. Replace any light bulbs that burn out. Turn on lights if it is dark. Use night-lights. Place frequently used items in easy-to-reach places. Lower the shelves around your home if necessary. Set up furniture so that there are clear paths around it. Avoid moving your furniture around. Remove throw rugs and other tripping hazards from the floor. Avoid walking on wet floors. Fix any uneven floor surfaces. Add color or contrast paint or tape to grab bars and handrails in your home. Place contrasting color strips on the first and last steps of stairways. When you use a stepladder, make sure that it is completely opened and that the sides are firmly locked. Have someone hold the ladder while you are using it. Do not climb a closed stepladder. Be aware of any and all pets. What can I do in the bathroom?   Keep the floor dry. Immediately clean up any water that spills onto the floor. Remove soap buildup in the tub or shower on a regular basis. Use non-skid mats or decals on the floor of the tub or  shower. Attach bath mats securely with double-sided, non-slip rug tape. If you need to sit down while you are in the shower, use a plastic, non-slip stool. Install grab bars by the toilet and in the tub and shower. Do not use towel bars as grab bars. What can I do in the bedroom? Make sure that a bedside light is easy to reach. Do not use oversized bedding that drapes onto the floor. Have a firm chair that has side arms to use for getting dressed. What can I do in the kitchen? Clean up any spills right away. If you need to reach for something above you, use a sturdy step stool that has a grab bar. Keep electrical cables out of the way. Do not use floor polish or wax that makes floors slippery. If you must use wax, make sure that it is non-skid floor wax. What can I do in the stairways? Do not leave any items on the stairs. Make sure that you have a light switch at the top of the stairs and the bottom of the stairs. Have them installed if you do not have them. Make sure that there are handrails on both sides of the stairs. Fix handrails that are broken or loose. Make sure that handrails are as long as the stairways. Install non-slip stair treads on all stairs in your home. Avoid having throw rugs at the top or bottom of stairways, or secure the rugs  with carpet tape to prevent them from moving. Choose a carpet design that does not hide the edge of steps on the stairway. Check any carpeting to make sure that it is firmly attached to the stairs. Fix any carpet that is loose or worn. What can I do on the outside of my home? Use bright outdoor lighting. Regularly repair the edges of walkways and driveways and fix any cracks. Remove high doorway thresholds. Trim any shrubbery on the main path into your home. Regularly check that handrails are securely fastened and in good repair. Both sides of any steps should have handrails. Install guardrails along the edges of any raised decks or  porches. Clear walkways of debris and clutter, including tools and rocks. Have leaves, snow, and ice cleared regularly. Use sand or salt on walkways during winter months. In the garage, clean up any spills right away, including grease or oil spills. What other actions can I take? Wear closed-toe shoes that fit well and support your feet. Wear shoes that have rubber soles or low heels. Use mobility aids as needed, such as canes, walkers, scooters, and crutches. Review your medicines with your health care provider. Some medicines can cause dizziness or changes in blood pressure, which increase your risk of falling. Talk with your health care provider about other ways that you can decrease your risk of falls. This may include working with a physical therapist or trainer to improve your strength, balance, and endurance. Where to find more information Centers for Disease Control and Prevention, [employer]: WebmailGuide.co.za [employer] on Aging: BrainJudge.co.uk Contact a health care provider if: You are afraid of falling at home. You feel weak, drowsy, or dizzy at home. You fall at home. Summary There are many simple things that you can do to make your home safe and to help prevent falls. Ways to make your home safe include removing tripping hazards and installing grab bars in the bathroom. Ask for help when making these changes in your home. This information is not intended to replace advice given to you by your health care provider. Make sure you discuss any questions you have with your health care provider. Document Revised: 11/23/2017 Document Reviewed: 07/26/2017 Elsevier Patient Education  2021 [employer].

## 2021-09-07 NOTE — Progress Notes (Signed)
Subjective:  Patient ID: Nicholas Finley, male    DOB: 03/27/1926  Age: 87 y.o. MRN: [id number]  CC:  Chief Complaint  Patient presents with   Prediabetes    Pt here for recheck does not note any changes. Pt doing well    Hyperlipidemia    Pt reports he is doing well no concerns at this time due for rechecks    Immunizations    High dose flu today    Medication Problem    Pt would like to discuss concerns around low dose aspirin     HPI Nicholas Finley presents for   Hypertension: Lisinopril '10mg'$  qd. Prior hx of dizziness, declined cardiology, vascular eval. Chronic small vessel dz on brain MRI 2015. Option to stop doxazosin - had been taking for BPH.  Overall feeling about the same, possibly better. No change off doxazosin. No worsening symptoms. No recent change in dizziness - still notes if looking up. Using 4pt cane - no recent falls. Discussed possible vascular steal, but he still declines further eval, workup.  Off doxazosin - no change in urinary symptoms. Still urinating every few hours. Nocturia usually 2 times per night, sometimes 3 - chronic.  Urology: [name].  Home readings: none On asa '81mg'$  qd. No hx of GIB/PUD. Episode of transient global amnesia vs.TIA in 2015.  Cigar daily.  BP Readings from Last 3 Encounters:  09/07/21 136/64  03/09/21 130/62  12/06/20 124/70   Lab Results  Component Value Date   CREATININE 0.70 (L) 03/16/2021   Hyperlipidemia: Lipitor '10mg'$  qd. No new myalgias or side effects.  Lab Results  Component Value Date   CHOL 147 03/16/2021   HDL 66 03/16/2021   LDLCALC 69 03/16/2021   TRIG 58 03/16/2021   CHOLHDL 2.2 03/16/2021   Lab Results  Component Value Date   ALT 20 03/16/2021   AST 23 03/16/2021   ALKPHOS 73 03/16/2021   BILITOT 1.1 03/16/2021   Prediabetes: Stable in testing in March.  Lab Results  Component Value Date   HGBA1C 5.8 (H) 03/16/2021   Wt Readings from Last 3 Encounters:  09/07/21 149 lb (67.6  kg)  03/09/21 148 lb (67.1 kg)  12/06/20 152 lb (68.9 kg)    History Patient Active Problem List   Diagnosis Date Noted   HTN (hypertension) 03/01/2012   Hyperlipidemia 03/01/2012   Hearing difficulty 03/01/2012   Allergic rhinitis 03/01/2012   Past Medical History:  Diagnosis Date   Allergy    Cataract    Enlarged prostate    Heart murmur    Hypercholesteremia    Hypertension    Past Surgical History:  Procedure Laterality Date   CYST EXCISION     pilonidal   SKIN CANCER EXCISION     No Known Allergies Prior to Admission medications   Medication Sig Start Date End Date Taking? Authorizing Provider  aspirin 81 MG tablet Take 81 mg by mouth daily.   Yes [provider]  atorvastatin (LIPITOR) 10 MG tablet TAKE ONE TABLET BY MOUTH DAILY 08/19/21  Yes [name], MD  Azelastine HCl (ASTEPRO) 0.15 % SOLN instill 1 spray into each nostril twice a day if needed 03/10/19  Yes [name], MD  lisinopril (ZESTRIL) 10 MG tablet TAKE ONE TABLET BY MOUTH DAILY 08/19/21  Yes [name], MD   Social History   Socioeconomic History   Marital status: Single    Spouse name: Not on file   Number of children: 0  Years of education: 38   Highest education level: Not on file  Occupational History    Comment: retired  Tobacco Use   Smoking status: Some Days    Types: Cigarettes, Pipe, Cigars   Smokeless tobacco: Never   Tobacco comments:    cigars   Vaping Use   Vaping Use: Never used  Substance and Sexual Activity   Alcohol use: Yes    Alcohol/week: 0.0 standard drinks    Comment: occas beer   Drug use: No   Sexual activity: Not on file  Other Topics Concern   Not on file  Social History Narrative   Patient is right handed, resides alone, consumes caffeine twice daily   Social Determinants of Health   Financial Resource Strain: Not on file  Food Insecurity: Not on file  Transportation Needs: Not on file  Physical Activity: Not on file   Stress: Not on file  Social Connections: Not on file  Intimate Partner Violence: Not on file    Review of Systems  Constitutional:  Negative for fatigue and unexpected weight change.  Eyes:  Negative for visual disturbance.  Respiratory:  Negative for cough, chest tightness and shortness of breath.   Cardiovascular:  Negative for chest pain, palpitations and leg swelling.  Gastrointestinal:  Negative for abdominal pain and blood in stool.  Neurological:  Positive for dizziness (as above.). Negative for light-headedness and headaches.  Per HPI.   Objective:   Vitals:   09/07/21 1023  BP: 136/64  Pulse: 68  Resp: 15  Temp: 98.2 F (36.8 C)  TempSrc: Temporal  SpO2: 97%  Weight: 149 lb (67.6 kg)  Height: 5' 5.5" (1.664 m)     Physical Exam Vitals reviewed.  Constitutional:      Appearance: He is well-developed.  HENT:     Head: Normocephalic and atraumatic.  Neck:     Vascular: No carotid bruit or JVD.  Cardiovascular:     Rate and Rhythm: Normal rate and regular rhythm.     Heart sounds: Normal heart sounds. No murmur heard. Pulmonary:     Effort: Pulmonary effort is normal.     Breath sounds: Normal breath sounds. No rales.  Musculoskeletal:     Right lower leg: No edema.     Left lower leg: No edema.  Skin:    General: Skin is warm and dry.  Neurological:     Mental Status: He is alert and oriented to person, place, and time.  Psychiatric:        Mood and Affect: Mood normal.       Assessment & Plan:  Nicholas Finley is a 87 y.o. male . Essential hypertension - Plan: lisinopril (ZESTRIL) 10 MG tablet, Comprehensive metabolic panel  -  Stable, tolerating current regimen. Medications refilled. Labs pending as above.   Hyperlipidemia, unspecified hyperlipidemia type - Plan: atorvastatin (LIPITOR) 10 MG tablet, Comprehensive metabolic panel, Lipid panel  -  Stable, tolerating current regimen. Medications refilled. Labs pending as above.   Need for  influenza vaccination - Plan: Flu Vaccine QUAD High Dose(Fluad)  Prediabetes - Plan: Hemoglobin A1c  - stable prior. Unlikely to progress to diabetes based on age and recent stability - check labs.   Episode of dizziness  -With positional component, looking up, differential includes vascular steal.  Have discussed further work-up and evaluation previously as well as today but he declines that at this time.  Continued fall precautions discussed, use of cane, handout given.  RTC precautions.  Nocturia  -  Minimal change off doxazosin and we will hold on restarting given dizziness as above.  However minimal change off meds.  With persistent nocturia recommended he discuss other options with his urologist.  RTC precautions.  Meds ordered this encounter  Medications   atorvastatin (LIPITOR) 10 MG tablet    Sig: Take 1 tablet (10 mg total) by mouth daily.    Dispense:  90 tablet    Refill:  1   lisinopril (ZESTRIL) 10 MG tablet    Sig: Take 1 tablet (10 mg total) by mouth daily.    Dispense:  90 tablet    Refill:  1   Patient Instructions  Dizziness with looking up could be due to blood vessel issue. Let me know if you would like to meet with specialist to evaluate this further. Continue to use cane. See fall precautions below.   If no change off doxazosin with urinary symptoms, then no need to restart at this time. I would recommend discussing other options with your urologist. Let me know if I can help further.   No med changes today. You have option to stop aspirin, but with unknown cause of symptoms in 2015 it is reasonable to continue as well. Let me know if there are further questions.   Fall Prevention in the Home, Adult Falls can cause injuries and can affect people from all age groups. There are many simple things that you can do to make your home safe and to help prevent falls. Ask for help when making these changes, if needed. What actions can I take to prevent falls? General  instructions Use good lighting in all rooms. Replace any light bulbs that burn out. Turn on lights if it is dark. Use night-lights. Place frequently used items in easy-to-reach places. Lower the shelves around your home if necessary. Set up furniture so that there are clear paths around it. Avoid moving your furniture around. Remove throw rugs and other tripping hazards from the floor. Avoid walking on wet floors. Fix any uneven floor surfaces. Add color or contrast paint or tape to grab bars and handrails in your home. Place contrasting color strips on the first and last steps of stairways. When you use a stepladder, make sure that it is completely opened and that the sides are firmly locked. Have someone hold the ladder while you are using it. Do not climb a closed stepladder. Be aware of any and all pets. What can I do in the bathroom?   Keep the floor dry. Immediately clean up any water that spills onto the floor. Remove soap buildup in the tub or shower on a regular basis. Use non-skid mats or decals on the floor of the tub or shower. Attach bath mats securely with double-sided, non-slip rug tape. If you need to sit down while you are in the shower, use a plastic, non-slip stool. Install grab bars by the toilet and in the tub and shower. Do not use towel bars as grab bars. What can I do in the bedroom? Make sure that a bedside light is easy to reach. Do not use oversized bedding that drapes onto the floor. Have a firm chair that has side arms to use for getting dressed. What can I do in the kitchen? Clean up any spills right away. If you need to reach for something above you, use a sturdy step stool that has a grab bar. Keep electrical cables out of the way. Do not use floor polish or wax that makes  floors slippery. If you must use wax, make sure that it is non-skid floor wax. What can I do in the stairways? Do not leave any items on the stairs. Make sure that you have a light  switch at the top of the stairs and the bottom of the stairs. Have them installed if you do not have them. Make sure that there are handrails on both sides of the stairs. Fix handrails that are broken or loose. Make sure that handrails are as long as the stairways. Install non-slip stair treads on all stairs in your home. Avoid having throw rugs at the top or bottom of stairways, or secure the rugs with carpet tape to prevent them from moving. Choose a carpet design that does not hide the edge of steps on the stairway. Check any carpeting to make sure that it is firmly attached to the stairs. Fix any carpet that is loose or worn. What can I do on the outside of my home? Use bright outdoor lighting. Regularly repair the edges of walkways and driveways and fix any cracks. Remove high doorway thresholds. Trim any shrubbery on the main path into your home. Regularly check that handrails are securely fastened and in good repair. Both sides of any steps should have handrails. Install guardrails along the edges of any raised decks or porches. Clear walkways of debris and clutter, including tools and rocks. Have leaves, snow, and ice cleared regularly. Use sand or salt on walkways during winter months. In the garage, clean up any spills right away, including grease or oil spills. What other actions can I take? Wear closed-toe shoes that fit well and support your feet. Wear shoes that have rubber soles or low heels. Use mobility aids as needed, such as canes, walkers, scooters, and crutches. Review your medicines with your health care provider. Some medicines can cause dizziness or changes in blood pressure, which increase your risk of falling. Talk with your health care provider about other ways that you can decrease your risk of falls. This may include working with a physical therapist or trainer to improve your strength, balance, and endurance. Where to find more information Centers for Disease  Control and Prevention, [employer]: WebmailGuide.co.za [employer] on Aging: BrainJudge.co.uk Contact a health care provider if: You are afraid of falling at home. You feel weak, drowsy, or dizzy at home. You fall at home. Summary There are many simple things that you can do to make your home safe and to help prevent falls. Ways to make your home safe include removing tripping hazards and installing grab bars in the bathroom. Ask for help when making these changes in your home. This information is not intended to replace advice given to you by your health care provider. Make sure you discuss any questions you have with your health care provider. Document Revised: 11/23/2017 Document Reviewed: 07/26/2017 Elsevier Patient Education  2021 [place],   Merri Ray, MD [place] 09/07/21 11:20 AM

## 2021-10-09 ENCOUNTER — Emergency Department (HOSPITAL_COMMUNITY): Payer: Medicare Other

## 2021-10-09 ENCOUNTER — Encounter (HOSPITAL_COMMUNITY): Payer: Self-pay | Admitting: Pharmacy Technician

## 2021-10-09 ENCOUNTER — Inpatient Hospital Stay (HOSPITAL_COMMUNITY)
Admission: EM | Admit: 2021-10-09 | Discharge: 2021-10-25 | DRG: 871 | Disposition: A | Discharge status: Home Health | Payer: Medicare Other | Attending: Student | Admitting: Student

## 2021-10-09 DIAGNOSIS — M79604 Pain in right leg: Secondary | ICD-10-CM | POA: Diagnosis not present

## 2021-10-09 DIAGNOSIS — D72829 Elevated white blood cell count, unspecified: Secondary | ICD-10-CM | POA: Diagnosis not present

## 2021-10-09 DIAGNOSIS — Z66 Do not resuscitate: Secondary | ICD-10-CM | POA: Diagnosis present

## 2021-10-09 DIAGNOSIS — R110 Nausea: Secondary | ICD-10-CM | POA: Diagnosis not present

## 2021-10-09 DIAGNOSIS — I272 Pulmonary hypertension, unspecified: Secondary | ICD-10-CM | POA: Diagnosis present

## 2021-10-09 DIAGNOSIS — K767 Hepatorenal syndrome: Secondary | ICD-10-CM | POA: Diagnosis not present

## 2021-10-09 DIAGNOSIS — D649 Anemia, unspecified: Secondary | ICD-10-CM | POA: Diagnosis present

## 2021-10-09 DIAGNOSIS — K5900 Constipation, unspecified: Secondary | ICD-10-CM | POA: Diagnosis present

## 2021-10-09 DIAGNOSIS — I208 Other forms of angina pectoris: Secondary | ICD-10-CM

## 2021-10-09 DIAGNOSIS — R0602 Shortness of breath: Secondary | ICD-10-CM | POA: Diagnosis not present

## 2021-10-09 DIAGNOSIS — I352 Nonrheumatic aortic (valve) stenosis with insufficiency: Secondary | ICD-10-CM | POA: Diagnosis present

## 2021-10-09 DIAGNOSIS — I248 Other forms of acute ischemic heart disease: Secondary | ICD-10-CM | POA: Diagnosis not present

## 2021-10-09 DIAGNOSIS — J9601 Acute respiratory failure with hypoxia: Secondary | ICD-10-CM | POA: Diagnosis not present

## 2021-10-09 DIAGNOSIS — K761 Chronic passive congestion of liver: Secondary | ICD-10-CM | POA: Diagnosis present

## 2021-10-09 DIAGNOSIS — Z8249 Family history of ischemic heart disease and other diseases of the circulatory system: Secondary | ICD-10-CM

## 2021-10-09 DIAGNOSIS — I110 Hypertensive heart disease with heart failure: Secondary | ICD-10-CM | POA: Diagnosis present

## 2021-10-09 DIAGNOSIS — Z20822 Contact with and (suspected) exposure to covid-19: Secondary | ICD-10-CM | POA: Diagnosis present

## 2021-10-09 DIAGNOSIS — J80 Acute respiratory distress syndrome: Secondary | ICD-10-CM | POA: Diagnosis not present

## 2021-10-09 DIAGNOSIS — A419 Sepsis, unspecified organism: Principal | ICD-10-CM

## 2021-10-09 DIAGNOSIS — I25110 Atherosclerotic heart disease of native coronary artery with unstable angina pectoris: Secondary | ICD-10-CM | POA: Diagnosis present

## 2021-10-09 DIAGNOSIS — I10 Essential (primary) hypertension: Secondary | ICD-10-CM | POA: Diagnosis not present

## 2021-10-09 DIAGNOSIS — J811 Chronic pulmonary edema: Secondary | ICD-10-CM | POA: Diagnosis not present

## 2021-10-09 DIAGNOSIS — J302 Other seasonal allergic rhinitis: Secondary | ICD-10-CM | POA: Diagnosis not present

## 2021-10-09 DIAGNOSIS — I959 Hypotension, unspecified: Secondary | ICD-10-CM | POA: Diagnosis not present

## 2021-10-09 DIAGNOSIS — Z7982 Long term (current) use of aspirin: Secondary | ICD-10-CM

## 2021-10-09 DIAGNOSIS — R072 Precordial pain: Secondary | ICD-10-CM | POA: Diagnosis not present

## 2021-10-09 DIAGNOSIS — K7290 Hepatic failure, unspecified without coma: Secondary | ICD-10-CM | POA: Diagnosis not present

## 2021-10-09 DIAGNOSIS — B179 Acute viral hepatitis, unspecified: Secondary | ICD-10-CM | POA: Diagnosis present

## 2021-10-09 DIAGNOSIS — J9690 Respiratory failure, unspecified, unspecified whether with hypoxia or hypercapnia: Secondary | ICD-10-CM | POA: Diagnosis present

## 2021-10-09 DIAGNOSIS — R404 Transient alteration of awareness: Secondary | ICD-10-CM | POA: Diagnosis not present

## 2021-10-09 DIAGNOSIS — Z9981 Dependence on supplemental oxygen: Secondary | ICD-10-CM | POA: Diagnosis not present

## 2021-10-09 DIAGNOSIS — F17210 Nicotine dependence, cigarettes, uncomplicated: Secondary | ICD-10-CM | POA: Diagnosis present

## 2021-10-09 DIAGNOSIS — R739 Hyperglycemia, unspecified: Secondary | ICD-10-CM | POA: Diagnosis not present

## 2021-10-09 DIAGNOSIS — R6521 Severe sepsis with septic shock: Secondary | ICD-10-CM | POA: Diagnosis present

## 2021-10-09 DIAGNOSIS — E7800 Pure hypercholesterolemia, unspecified: Secondary | ICD-10-CM | POA: Diagnosis present

## 2021-10-09 DIAGNOSIS — I350 Nonrheumatic aortic (valve) stenosis: Secondary | ICD-10-CM | POA: Diagnosis not present

## 2021-10-09 DIAGNOSIS — E876 Hypokalemia: Secondary | ICD-10-CM | POA: Diagnosis not present

## 2021-10-09 DIAGNOSIS — N179 Acute kidney failure, unspecified: Secondary | ICD-10-CM | POA: Diagnosis present

## 2021-10-09 DIAGNOSIS — J9600 Acute respiratory failure, unspecified whether with hypoxia or hypercapnia: Secondary | ICD-10-CM | POA: Diagnosis not present

## 2021-10-09 DIAGNOSIS — J690 Pneumonitis due to inhalation of food and vomit: Secondary | ICD-10-CM | POA: Diagnosis not present

## 2021-10-09 DIAGNOSIS — J90 Pleural effusion, not elsewhere classified: Secondary | ICD-10-CM | POA: Diagnosis not present

## 2021-10-09 DIAGNOSIS — R0689 Other abnormalities of breathing: Secondary | ICD-10-CM | POA: Diagnosis not present

## 2021-10-09 DIAGNOSIS — I255 Ischemic cardiomyopathy: Secondary | ICD-10-CM | POA: Diagnosis present

## 2021-10-09 DIAGNOSIS — R410 Disorientation, unspecified: Secondary | ICD-10-CM | POA: Diagnosis not present

## 2021-10-09 DIAGNOSIS — I21A1 Myocardial infarction type 2: Secondary | ICD-10-CM | POA: Diagnosis not present

## 2021-10-09 DIAGNOSIS — Z85828 Personal history of other malignant neoplasm of skin: Secondary | ICD-10-CM

## 2021-10-09 DIAGNOSIS — M79605 Pain in left leg: Secondary | ICD-10-CM | POA: Diagnosis not present

## 2021-10-09 DIAGNOSIS — Z515 Encounter for palliative care: Secondary | ICD-10-CM

## 2021-10-09 DIAGNOSIS — R7303 Prediabetes: Secondary | ICD-10-CM | POA: Diagnosis present

## 2021-10-09 DIAGNOSIS — I25119 Atherosclerotic heart disease of native coronary artery with unspecified angina pectoris: Secondary | ICD-10-CM | POA: Diagnosis not present

## 2021-10-09 DIAGNOSIS — R17 Unspecified jaundice: Secondary | ICD-10-CM | POA: Diagnosis not present

## 2021-10-09 DIAGNOSIS — J189 Pneumonia, unspecified organism: Secondary | ICD-10-CM | POA: Diagnosis not present

## 2021-10-09 DIAGNOSIS — I5082 Biventricular heart failure: Secondary | ICD-10-CM | POA: Diagnosis not present

## 2021-10-09 DIAGNOSIS — I214 Non-ST elevation (NSTEMI) myocardial infarction: Secondary | ICD-10-CM

## 2021-10-09 DIAGNOSIS — I5023 Acute on chronic systolic (congestive) heart failure: Secondary | ICD-10-CM | POA: Diagnosis not present

## 2021-10-09 DIAGNOSIS — R6520 Severe sepsis without septic shock: Secondary | ICD-10-CM | POA: Diagnosis not present

## 2021-10-09 DIAGNOSIS — E872 Acidosis, unspecified: Secondary | ICD-10-CM | POA: Diagnosis not present

## 2021-10-09 DIAGNOSIS — N400 Enlarged prostate without lower urinary tract symptoms: Secondary | ICD-10-CM | POA: Diagnosis present

## 2021-10-09 DIAGNOSIS — I5021 Acute systolic (congestive) heart failure: Secondary | ICD-10-CM | POA: Diagnosis not present

## 2021-10-09 DIAGNOSIS — N19 Unspecified kidney failure: Secondary | ICD-10-CM | POA: Diagnosis not present

## 2021-10-09 DIAGNOSIS — I509 Heart failure, unspecified: Secondary | ICD-10-CM | POA: Diagnosis not present

## 2021-10-09 DIAGNOSIS — R0902 Hypoxemia: Secondary | ICD-10-CM | POA: Diagnosis not present

## 2021-10-09 DIAGNOSIS — Z710 Person encountering health services to consult on behalf of another person: Secondary | ICD-10-CM | POA: Diagnosis not present

## 2021-10-09 DIAGNOSIS — R109 Unspecified abdominal pain: Secondary | ICD-10-CM | POA: Diagnosis not present

## 2021-10-09 DIAGNOSIS — I5020 Unspecified systolic (congestive) heart failure: Secondary | ICD-10-CM | POA: Diagnosis not present

## 2021-10-09 DIAGNOSIS — E785 Hyperlipidemia, unspecified: Secondary | ICD-10-CM | POA: Diagnosis not present

## 2021-10-09 DIAGNOSIS — I517 Cardiomegaly: Secondary | ICD-10-CM | POA: Diagnosis not present

## 2021-10-09 DIAGNOSIS — R748 Abnormal levels of other serum enzymes: Secondary | ICD-10-CM | POA: Diagnosis not present

## 2021-10-09 DIAGNOSIS — R570 Cardiogenic shock: Secondary | ICD-10-CM | POA: Diagnosis not present

## 2021-10-09 DIAGNOSIS — Z8673 Personal history of transient ischemic attack (TIA), and cerebral infarction without residual deficits: Secondary | ICD-10-CM

## 2021-10-09 DIAGNOSIS — Z743 Need for continuous supervision: Secondary | ICD-10-CM | POA: Diagnosis not present

## 2021-10-09 DIAGNOSIS — R079 Chest pain, unspecified: Secondary | ICD-10-CM

## 2021-10-09 DIAGNOSIS — E871 Hypo-osmolality and hyponatremia: Secondary | ICD-10-CM | POA: Diagnosis present

## 2021-10-09 DIAGNOSIS — Z79899 Other long term (current) drug therapy: Secondary | ICD-10-CM

## 2021-10-09 DIAGNOSIS — J810 Acute pulmonary edema: Secondary | ICD-10-CM | POA: Diagnosis not present

## 2021-10-09 DIAGNOSIS — R7989 Other specified abnormal findings of blood chemistry: Secondary | ICD-10-CM | POA: Diagnosis not present

## 2021-10-09 DIAGNOSIS — Z789 Other specified health status: Secondary | ICD-10-CM | POA: Diagnosis not present

## 2021-10-09 DIAGNOSIS — Z7189 Other specified counseling: Secondary | ICD-10-CM | POA: Diagnosis not present

## 2021-10-09 HISTORY — DX: Chest pain, unspecified: R07.9

## 2021-10-09 HISTORY — DX: Sepsis, unspecified organism: A41.9

## 2021-10-09 LAB — I-STAT VENOUS BLOOD GAS, ED
Acid-base deficit: 14 mmol/L — ABNORMAL HIGH (ref 0.0–2.0)
Acid-base deficit: 5 mmol/L — ABNORMAL HIGH (ref 0.0–2.0)
Bicarbonate: 15.2 mmol/L — ABNORMAL LOW (ref 20.0–28.0)
Bicarbonate: 22 mmol/L (ref 20.0–28.0)
Calcium, Ion: 1.08 mmol/L — ABNORMAL LOW (ref 1.15–1.40)
Calcium, Ion: 1.04 mmol/L — ABNORMAL LOW (ref 1.15–1.40)
HCT: 44 % (ref 39.0–52.0)
HCT: 46 % (ref 39.0–52.0)
Hemoglobin: 15 g/dL (ref 13.0–17.0)
Hemoglobin: 15.6 g/dL (ref 13.0–17.0)
O2 Saturation: 95 %
O2 Saturation: 64 %
Potassium: 4.5 mmol/L (ref 3.5–5.1)
Potassium: 4.3 mmol/L (ref 3.5–5.1)
Sodium: 139 mmol/L (ref 135–145)
Sodium: 140 mmol/L (ref 135–145)
TCO2: 17 mmol/L — ABNORMAL LOW (ref 22–32)
TCO2: 23 mmol/L (ref 22–32)
pCO2, Ven: 48.2 mmHg (ref 44.0–60.0)
pCO2, Ven: 47 mmHg (ref 44.0–60.0)
pH, Ven: 7.107 — CL (ref 7.250–7.430)
pH, Ven: 7.278 (ref 7.250–7.430)
pO2, Ven: 101 mmHg — ABNORMAL HIGH (ref 32.0–45.0)
pO2, Ven: 38 mmHg (ref 32.0–45.0)

## 2021-10-09 LAB — COMPREHENSIVE METABOLIC PANEL
ALT: 66 U/L — ABNORMAL HIGH (ref 0–44)
AST: 114 U/L — ABNORMAL HIGH (ref 15–41)
Albumin: 3.3 g/dL — ABNORMAL LOW (ref 3.5–5.0)
Alkaline Phosphatase: 70 U/L (ref 38–126)
Anion gap: 17 — ABNORMAL HIGH (ref 5–15)
BUN: 23 mg/dL (ref 8–23)
CO2: 15 mmol/L — ABNORMAL LOW (ref 22–32)
Calcium: 8.8 mg/dL — ABNORMAL LOW (ref 8.9–10.3)
Chloride: 107 mmol/L (ref 98–111)
Creatinine, Ser: 1.47 mg/dL — ABNORMAL HIGH (ref 0.61–1.24)
GFR, Estimated: 44 mL/min — ABNORMAL LOW (ref >60)
Glucose, Bld: 295 mg/dL — ABNORMAL HIGH (ref 70–99)
Potassium: 4.8 mmol/L (ref 3.5–5.1)
Sodium: 139 mmol/L (ref 135–145)
Total Bilirubin: 0.9 mg/dL (ref 0.3–1.2)
Total Protein: 6.9 g/dL (ref 6.5–8.1)

## 2021-10-09 LAB — CBC WITH DIFFERENTIAL/PLATELET
Abs Immature Granulocytes: 0.23 10*3/uL — ABNORMAL HIGH (ref 0.00–0.07)
Basophils Absolute: 0.1 10*3/uL (ref 0.0–0.1)
Basophils Relative: 1 %
Eosinophils Absolute: 0.1 10*3/uL (ref 0.0–0.5)
Eosinophils Relative: 0 %
HCT: 46 % (ref 39.0–52.0)
Hemoglobin: 14 g/dL (ref 13.0–17.0)
Immature Granulocytes: 1 %
Lymphocytes Relative: 50 %
Lymphs Abs: 10.7 10*3/uL — ABNORMAL HIGH (ref 0.7–4.0)
MCH: 31.3 pg (ref 26.0–34.0)
MCHC: 30.4 g/dL (ref 30.0–36.0)
MCV: 102.9 fL — ABNORMAL HIGH (ref 80.0–100.0)
Monocytes Absolute: 1.2 10*3/uL — ABNORMAL HIGH (ref 0.1–1.0)
Monocytes Relative: 6 %
Neutro Abs: 8.8 10*3/uL — ABNORMAL HIGH (ref 1.7–7.7)
Neutrophils Relative %: 42 %
Platelets: 318 10*3/uL (ref 150–400)
RBC: 4.47 MIL/uL (ref 4.22–5.81)
RDW: 14.1 % (ref 11.5–15.5)
WBC: 21.1 10*3/uL — ABNORMAL HIGH (ref 4.0–10.5)
nRBC: 0 % (ref 0.0–0.2)

## 2021-10-09 LAB — RESP PANEL BY RT-PCR (FLU A&B, COVID) ARPGX2
Influenza A by PCR: NEGATIVE
Influenza B by PCR: NEGATIVE
SARS Coronavirus 2 by RT PCR: NEGATIVE

## 2021-10-09 LAB — LACTIC ACID, PLASMA
Lactic Acid, Venous: 8.5 mmol/L (ref 0.5–1.9)
Lactic Acid, Venous: 3.4 mmol/L (ref 0.5–1.9)

## 2021-10-09 LAB — BRAIN NATRIURETIC PEPTIDE: B Natriuretic Peptide: 625.3 pg/mL — ABNORMAL HIGH (ref 0.0–100.0)

## 2021-10-09 LAB — GLUCOSE, CAPILLARY
Glucose-Capillary: 149 mg/dL — ABNORMAL HIGH (ref 70–99)
Glucose-Capillary: 174 mg/dL — ABNORMAL HIGH (ref 70–99)

## 2021-10-09 LAB — TROPONIN I (HIGH SENSITIVITY)
Troponin I (High Sensitivity): 155 ng/L (ref <18)
Troponin I (High Sensitivity): 1071 ng/L (ref <18)

## 2021-10-09 LAB — CBG MONITORING, ED: Glucose-Capillary: 265 mg/dL — ABNORMAL HIGH (ref 70–99)

## 2021-10-09 MED ORDER — POLYETHYLENE GLYCOL 3350 17 G PO PACK: 17.0000 g | PACK | Freq: Every day | ORAL | Status: DC | PRN

## 2021-10-09 MED ORDER — HEPARIN BOLUS VIA INFUSION
4000.0000 [IU] | Freq: Once | INTRAVENOUS | Status: AC
  Administered 2021-10-09: 4000 [IU] via INTRAVENOUS
  Filled 2021-10-09: qty 4000

## 2021-10-09 MED ORDER — SODIUM CHLORIDE 0.9 % IV SOLN
1.0000 g | Freq: Once | INTRAVENOUS | Status: AC
  Administered 2021-10-09: 1 g via INTRAVENOUS
  Filled 2021-10-09: qty 10

## 2021-10-09 MED ORDER — ASPIRIN 325 MG PO TABS: 325.0000 mg | ORAL_TABLET | Freq: Once | ORAL | Status: DC

## 2021-10-09 MED ORDER — METOPROLOL TARTRATE 25 MG PO TABS
25.0000 mg | ORAL_TABLET | Freq: Two times a day (BID) | ORAL | Status: DC
  Administered 2021-10-10 – 2021-10-11 (×3): 25 mg via ORAL
  Filled 2021-10-09 (×3): qty 1

## 2021-10-09 MED ORDER — NITROGLYCERIN IN D5W 200-5 MCG/ML-% IV SOLN
0.0000 ug/min | INTRAVENOUS | Status: DC
Start: 2021-10-09 — End: 2021-10-12
  Administered 2021-10-09: 10 ug/min via INTRAVENOUS
  Administered 2021-10-11: 5 ug/min via INTRAVENOUS
  Filled 2021-10-09 (×3): qty 250

## 2021-10-09 MED ORDER — ATORVASTATIN CALCIUM 40 MG PO TABS
40.0000 mg | ORAL_TABLET | Freq: Every day | ORAL | Status: DC
  Administered 2021-10-10 – 2021-10-18 (×9): 40 mg via ORAL
  Filled 2021-10-09 (×11): qty 1

## 2021-10-09 MED ORDER — SODIUM CHLORIDE 0.9 % IV SOLN
2.0000 g | INTRAVENOUS | Status: DC
  Administered 2021-10-09: 2 g via INTRAVENOUS
  Filled 2021-10-09 (×2): qty 2

## 2021-10-09 MED ORDER — HEPARIN (PORCINE) 25000 UT/250ML-% IV SOLN
1250.0000 [IU]/h | INTRAVENOUS | Status: DC
  Administered 2021-10-09: 800 [IU]/h via INTRAVENOUS
  Administered 2021-10-10: 1000 [IU]/h via INTRAVENOUS
  Filled 2021-10-09 (×2): qty 250

## 2021-10-09 MED ORDER — VANCOMYCIN HCL 1250 MG/250ML IV SOLN
1250.0000 mg | Freq: Once | INTRAVENOUS | Status: AC
  Administered 2021-10-09: 1250 mg via INTRAVENOUS
  Filled 2021-10-09: qty 250

## 2021-10-09 MED ORDER — ASPIRIN EC 81 MG PO TBEC
81.0000 mg | DELAYED_RELEASE_TABLET | Freq: Every day | ORAL | Status: DC
  Administered 2021-10-10 – 2021-10-18 (×9): 81 mg via ORAL
  Filled 2021-10-09 (×11): qty 1

## 2021-10-09 MED ORDER — VANCOMYCIN HCL IN DEXTROSE 1-5 GM/200ML-% IV SOLN: 1000.0000 mg | INTRAVENOUS | Status: DC

## 2021-10-09 MED ORDER — DOCUSATE SODIUM 100 MG PO CAPS
100.0000 mg | ORAL_CAPSULE | Freq: Two times a day (BID) | ORAL | Status: DC | PRN
  Administered 2021-10-16 – 2021-10-18 (×2): 100 mg via ORAL
  Filled 2021-10-09 (×2): qty 1

## 2021-10-09 MED ORDER — INSULIN ASPART 100 UNIT/ML IJ SOLN
0.0000 [IU] | INTRAMUSCULAR | Status: DC
  Administered 2021-10-10: 2 [IU] via SUBCUTANEOUS
  Administered 2021-10-10: 1 [IU] via SUBCUTANEOUS
  Administered 2021-10-10: 2 [IU] via SUBCUTANEOUS
  Administered 2021-10-11 – 2021-10-12 (×2): 1 [IU] via SUBCUTANEOUS

## 2021-10-09 MED ORDER — SODIUM CHLORIDE 0.9 % IV SOLN
500.0000 mg | Freq: Once | INTRAVENOUS | Status: AC
  Administered 2021-10-09: 500 mg via INTRAVENOUS
  Filled 2021-10-09: qty 500

## 2021-10-09 NOTE — Consult Note (Addendum)
Cardiology Consultation:   Patient ID: Stockton Nunley MRN: [id number]; DOB: March 09, 1926  Admit date: 10/09/2021 Date of Consult: 10/09/2021  PCP:  [name], MD   [facility] Providers Cardiologist:  None        Patient Profile:   Nicholas Finley is a 87 y.o. male with a hx of HTN, HLD, remoted h/o TIA who is being seen 10/09/2021 for the evaluation of chest pain, ekg changes at the request of Er Doc- Dr Maryan Rued.  History of Present Illness:   Briston Lax is a 87 y.o. male with a hx of HTN, HLD, remoted h/o TIA who is being seen 10/09/2021 for the evaluation of chest pain, ekg changes.   Patient called 911- with c/o chest pain and severe SOB, when EMS came- he was in acute respiratory distress, extremist, sating 67%-> needing bagged, rales/wheezes. Initial EKG showed sinus tachycardia, difficult to interprete but ST depressions in inferior, lateral leads. Patient GCS 6,was started on BIPAP, subsequent EKGs obtained which shows aVR STE and inferior, lateral ST depressions. Thus cards was consulted.  ER course: patient on BIPAP, initial VBG- pH 7.1, lactic acid 8.1, BP 145/83, HR 140s, RR 30s- blood c/s drawn, CXR shows b/l airspace dx more in upper lobes- infection, WBC 21 k, trop 155, bnp 655. Subsequently patient's mental status got better, he was c/o chest pain- started on Nitro gtt with lowering his BP and his chest pain got better 2/10 at the time when I saw.  I did bedside ECHO- his global LV is depressed, very limited due to thin man and respiratory distress: overall, global LV dysfunction, RV is normal in size and systolic function, trace pericardial effusion, IVC is collapsable. Aortic valve stenosis- not opening much, calcified  Patient reports that he has been getting progressively sob for  sometime now but denied any chest pain with exertion, no orthopnea, pnd.  Lives alone, got flu vaccine a month ago. Most of his Sob, chest pain just started today and  got worse. Does endorse fevers, chills and cough, no sick contacts.   Past Medical History:  Diagnosis Date   Allergy    Cataract    Chest pain 10/09/2021   Enlarged prostate    Heart murmur    Hypercholesteremia    Hypertension    Sepsis with acute hypoxic respiratory failure and septic shock ([place]) 10/09/2021    Past Surgical History:  Procedure Laterality Date   CYST EXCISION     pilonidal   SKIN CANCER EXCISION       Home Medications:  Prior to Admission medications   Medication Sig Start Date End Date Taking? Authorizing Provider  aspirin 81 MG tablet Take 81 mg by mouth daily.    [provider]  atorvastatin (LIPITOR) 10 MG tablet Take 1 tablet (10 mg total) by mouth daily. 09/07/21   [name], MD  Azelastine HCl (ASTEPRO) 0.15 % SOLN instill 1 spray into each nostril twice a day if needed 03/10/19   [name], MD  lisinopril (ZESTRIL) 10 MG tablet Take 1 tablet (10 mg total) by mouth daily. 09/07/21   [name], MD    Inpatient Medications: Scheduled Meds:  aspirin EC  81 mg Oral Daily   aspirin  325 mg Oral Once   atorvastatin  40 mg Oral Daily   metoprolol tartrate  25 mg Oral BID   Continuous Infusions:  azithromycin 500 mg (10/09/21 1925)   nitroGLYCERIN 10 mcg/min (10/09/21 1841)   [START ON  10/11/2021] vancomycin     vancomycin 1,250 mg (10/09/21 1922)   PRN Meds:   Allergies:   No Known Allergies  Social History:   Social History   Socioeconomic History   Marital status: Single    Spouse name: Not on file   Number of children: 0   Years of education: 16   Highest education level: Not on file  Occupational History    Comment: retired  Tobacco Use   Smoking status: Some Days    Types: Cigarettes, Pipe, Cigars   Smokeless tobacco: Never   Tobacco comments:    cigars   Vaping Use   Vaping Use: Never used  Substance and Sexual Activity   Alcohol use: Yes    Alcohol/week: 0.0 standard drinks    Comment:  occas beer   Drug use: No   Sexual activity: Not on file  Other Topics Concern   Not on file  Social History Narrative   Patient is right handed, resides alone, consumes caffeine twice daily   Social Determinants of Health   Financial Resource Strain: Not on file  Food Insecurity: Not on file  Transportation Needs: Not on file  Physical Activity: Not on file  Stress: Not on file  Social Connections: Not on file  Intimate Partner Violence: Not on file    Family History:    Family History  Problem Relation Age of Onset   Heart failure Father    Heart Problems Sister      ROS:  Please see the history of present illness.  All other ROS reviewed and negative.     Physical Exam/Data:   Vitals:   10/09/21 1800 10/09/21 1815 10/09/21 1830 10/09/21 1900  BP: (!) 145/83 140/83  130/82  Pulse: (!) 103 (!) 106 (!) 106 (!) 106  Resp: 20 (!) 24 20 (!) 26  Temp:      TempSrc:      SpO2: 99% 97% 95% 92%   No intake or output data in the 24 hours ending 10/09/21 1928 Last 3 Weights 09/07/2021 03/09/2021 12/06/2020  Weight (lbs) 149 lb 148 lb 152 lb  Weight (kg) 67.586 kg 67.132 kg 68.947 kg     There is no height or weight on file to calculate BMI.  General:  Well nourished, well developed, thin male, in acute respiratory distress HEENT: normal Neck: no JVD Vascular: No carotid bruits; Distal pulses 2+ bilaterally Cardiac:  tachycardic+, cannot hear any murmurs Lungs:  significant wheezes throughout the lung Abd: soft, nontender, no hepatomegaly  Ext: no edema Musculoskeletal:  No deformities, BUE and BLE strength normal and equal Skin: warm and dry  Neuro:  alert, oriented and conversive, no focal deficits Psych:  Normal affect   EKG:  The EKG was personally reviewed and demonstrates:  Sinus tachycardia, global ischemia (STE in aVR, inferior and lateral leads ST depression).  Initial isolated STE in aVL but not present in lead I so not diagnostic  Relevant CV  Studies: Echo-pending  Laboratory Data:  High Sensitivity Troponin:   Recent Labs  Lab 10/09/21 1710  TROPONINIHS 155*     Chemistry Recent Labs  Lab 10/09/21 1710 10/09/21 1720 10/09/21 1834  NA 139 139 140  K 4.8 4.5 4.3  CL 107  --   --   CO2 15*  --   --   GLUCOSE 295*  --   --   BUN 23  --   --   CREATININE 1.47*  --   --  CALCIUM 8.8*  --   --   GFRNONAA 44*  --   --   ANIONGAP 17*  --   --     Recent Labs  Lab 10/09/21 1710  PROT 6.9  ALBUMIN 3.3*  AST 114*  ALT 66*  ALKPHOS 70  BILITOT 0.9   Lipids No results for input(s): CHOL, TRIG, HDL, LABVLDL, LDLCALC, CHOLHDL in the last 168 hours.  Hematology Recent Labs  Lab 10/09/21 1710 10/09/21 1720 10/09/21 1834  WBC 21.1*  --   --   RBC 4.47  --   --   HGB 14.0 15.0 15.6  HCT 46.0 44.0 46.0  MCV 102.9*  --   --   MCH 31.3  --   --   MCHC 30.4  --   --   RDW 14.1  --   --   PLT 318  --   --    Thyroid No results for input(s): TSH, FREET4 in the last 168 hours.  BNP Recent Labs  Lab 10/09/21 1710  BNP 625.3*    DDimer No results for input(s): DDIMER in the last 168 hours.   Radiology/Studies:  DG Chest Port 1 View  Result Date: 10/09/2021 CLINICAL DATA:  Shortness of breath, respiratory distress EXAM: PORTABLE CHEST 1 VIEW COMPARISON:  05/13/2014 FINDINGS: Airspace disease throughout both lungs, most pronounced in the upper lobes bilaterally. Heart is normal size. No effusions or acute bony abnormality. IMPRESSION: Bilateral airspace disease, most pronounced in the upper lobes. Favor infection. Electronically Signed   By: Rolm Baptise M.D.   On: 10/09/2021 17:17     Assessment and Plan:   Chest pain, elevated troponin/NSTEMI (likely secondary to demand ischemia) given setting of sepsis, respiratory failure EKG: global ischemia (STE in aVR, inferior and lateral leads ST depression).  Acute hypoxic respiratory failure sec to PNA, Sepsis Lactic acidosis, ph 7.1 (acedemia) sec to above HTN,  HLD Likely has Aortic stenosis  Bedside ECHO- very limited due to thin man and respiratory distress: overall, global LV dysfunction, RV is normal in size and systolic function, trace pericardial effusion, IVC is collapsable. Aortic valve stenosis- not opening much, calcified  Plan: - Admit to ICU and continue mx of  sepsis/acute respiratory failure - in regards to his chest pain: EKG changes suggestive of global ischemia in the setting of respiratory distress, sepsis. His initial trop is 155, BNP  625, given his global LV dysfunction, agree with IV nitroglycerin to control his chest pain.  - continue IV nitro glycerin (but would keep his SBP ~ 120s, would not drop further given aortic stenosis (unclear if its severe) but be on cautious side  - ok with IVF NS for sepsis but would be cautious and gentle fluid resuscitation given LV dysfunction and probable AS. (Clinically appears dry)  - ok to start heparin gtt for 48 hours - give full dose aspirin 325mg  x1, aspirin 81mg  daily, start metoprolol tart 25mg  BID (tachycardic which is expected from sepsis), atorvastatin 40mg  daily.  Eventually needs a cath.  - sepsis/pna mx per ICU/medicine team - obtain full ECHO in am - continue to trend trops/ekg and call if worsening chest pain. - once hemodynamics better, chest pain better- wean nitro off  Discuss code status  Will follow along.  Risk Assessment/Risk Scores:     TIMI Risk Score for Unstable Angina or Non-ST Elevation MI:   The patient's TIMI risk score is 6, which indicates a 41% risk of all cause mortality, new or recurrent  myocardial infarction or need for urgent revascularization in the next 14 days.          For questions or updates, please contact [place] Please consult [email or web address] for contact info under    Signed, Renae Fickle, MD  10/09/2021 7:28 PM

## 2021-10-09 NOTE — Progress Notes (Signed)
eLink Physician-Brief Progress Note Patient Name: Nicholas Finley DOB: Jan 10, 1926 MRN: [id number]   Date of Service  10/09/2021  HPI/Events of Note  87 y.o. male with a hx of HTN, HLD, remoted h/o TIA who is being seen 10/09/2021 for the evaluation of chest pain, ekg changes. Cardiology seen already. Global ischemia on ECHO and elevated troponin on heparin gtt. NTG. Asa/statins.  AHRF on BiPAP. AGMA.  Pneumonia on abx.  AKI Elevated ast/alt from above.   Data: Elevated LA Elevated mostly reactive leukocytosis. Covid/flu neg.  Camera: Tolerating biPAP. Sats good.  VS stable. Sinus tachy 111.  SBP 140   eICU Interventions  - continue current care - on Heparin gtt - CBG goals < 180 - aspiration precautions - avoid nephrotoxic meds - watch Urine out put. -      Intervention Category Major Interventions: Other: Intermediate Interventions: Best-practice therapies (e.g. DVT, beta blocker, etc.);Diagnostic test evaluation Evaluation Type: New Patient Evaluation  Elmer Sow 10/09/2021, 9:35 PM

## 2021-10-09 NOTE — ED Notes (Signed)
Pt placed on bipap immediately after arrival to tx room.

## 2021-10-09 NOTE — ED Provider Notes (Signed)
[facility] EMERGENCY DEPARTMENT Provider Note   CSN: [id number] Arrival date & time: 10/09/21  1705     History Chief Complaint  Patient presents with   Respiratory Distress    Nicholas Finley is a 87 y.o. male.  Patient is a 87 year old male with a history of hypertension, hyperlipidemia who lives alone and is high functioning and able to care for himself who called EMS today and reported he was having chest pain and he could not breathe.  When EMS arrived patient had a GCS of 6 he was agonal he breathing and had oxygen saturation in the 60s.  He was still breathing spontaneously and when they attempted to intubate him his gag was still intact.  They bagged him in route to the emergency room with some improvement in oxygen saturation to the low 80s and patient became more responsive.  Based on chart review patient saw his doctor 1 month ago and at that time he had no specific complaints.  He did take blood pressure medications but had no other known heart issues.  Unclear if patient has not been well recently.  We will try to get collateral information  The history is provided by the patient, the EMS personnel and medical records. The history is limited by the condition of the patient.      Past Medical History:  Diagnosis Date   Allergy    Cataract    Enlarged prostate    Heart murmur    Hypercholesteremia    Hypertension     Patient Active Problem List   Diagnosis Date Noted   HTN (hypertension) 03/01/2012   Hyperlipidemia 03/01/2012   Hearing difficulty 03/01/2012   Allergic rhinitis 03/01/2012    Past Surgical History:  Procedure Laterality Date   CYST EXCISION     pilonidal   SKIN CANCER EXCISION         Family History  Problem Relation Age of Onset   Heart failure Father    Heart Problems Sister     Social History   Tobacco Use   Smoking status: Some Days    Types: Cigarettes, Pipe, Cigars   Smokeless tobacco: Never   Tobacco  comments:    cigars   Vaping Use   Vaping Use: Never used  Substance Use Topics   Alcohol use: Yes    Alcohol/week: 0.0 standard drinks    Comment: occas beer   Drug use: No    Home Medications Prior to Admission medications   Medication Sig Start Date End Date Taking? Authorizing Provider  aspirin 81 MG tablet Take 81 mg by mouth daily.    [provider]  atorvastatin (LIPITOR) 10 MG tablet Take 1 tablet (10 mg total) by mouth daily. 09/07/21   [name], MD  Azelastine HCl (ASTEPRO) 0.15 % SOLN instill 1 spray into each nostril twice a day if needed 03/10/19   [name], MD  lisinopril (ZESTRIL) 10 MG tablet Take 1 tablet (10 mg total) by mouth daily. 09/07/21   [name], MD    Allergies    Patient has no known allergies.  Review of Systems   Review of Systems  Unable to perform ROS: Acuity of condition   Physical Exam Updated Vital Signs BP 140/83   Pulse (!) 106   Temp 98.6 F (37 C) (Rectal)   Resp (!) 24   SpO2 97%   Physical Exam Vitals and nursing note reviewed.  Constitutional:  General: He is in acute distress.     Appearance: He is toxic-appearing and diaphoretic.  HENT:     Head: Normocephalic.     Mouth/Throat:     Mouth: Mucous membranes are dry.  Eyes:     Pupils: Pupils are equal, round, and reactive to light.  Cardiovascular:     Rate and Rhythm: Tachycardia present.     Pulses: Normal pulses.  Pulmonary:     Effort: Tachypnea, accessory muscle usage and respiratory distress present.     Breath sounds: Rhonchi and rales present.  Abdominal:     General: Abdomen is flat.  Musculoskeletal:        General: No swelling or tenderness.     Cervical back: Normal range of motion and neck supple.     Right lower leg: No edema.     Left lower leg: No edema.  Skin:    Coloration: Skin is pale.  Neurological:     Comments: Initially patient was minimally responsive however after BiPAP was placed patient is now  able to open his eyes and is trying to speak.  Patient noted to move all extremities  Psychiatric:     Comments: obtunded    ED Results / Procedures / Treatments   Labs (all labs ordered are listed, but only abnormal results are displayed) Labs Reviewed  CBC WITH DIFFERENTIAL/PLATELET - Abnormal; Notable for the following components:      Result Value   WBC 21.1 (*)    MCV 102.9 (*)    Neutro Abs 8.8 (*)    Lymphs Abs 10.7 (*)    Monocytes Absolute 1.2 (*)    Abs Immature Granulocytes 0.23 (*)    All other components within normal limits  BRAIN NATRIURETIC PEPTIDE - Abnormal; Notable for the following components:   B Natriuretic Peptide 625.3 (*)    All other components within normal limits  I-STAT VENOUS BLOOD GAS, ED - Abnormal; Notable for the following components:   pH, Ven 7.107 (*)    pO2, Ven 101.0 (*)    Bicarbonate 15.2 (*)    TCO2 17 (*)    Acid-base deficit 14.0 (*)    Calcium, Ion 1.08 (*)    All other components within normal limits  CBG MONITORING, ED - Abnormal; Notable for the following components:   Glucose-Capillary 265 (*)    All other components within normal limits  I-STAT VENOUS BLOOD GAS, ED - Abnormal; Notable for the following components:   Acid-base deficit 5.0 (*)    Calcium, Ion 1.04 (*)    All other components within normal limits  RESP PANEL BY RT-PCR (FLU A&B, COVID) ARPGX2  CULTURE, BLOOD (ROUTINE X 2)  CULTURE, BLOOD (ROUTINE X 2)  MRSA NEXT GEN BY PCR, NASAL  URINALYSIS, ROUTINE W REFLEX MICROSCOPIC  COMPREHENSIVE METABOLIC PANEL  LACTIC ACID, PLASMA  PATHOLOGIST SMEAR REVIEW  TROPONIN I (HIGH SENSITIVITY)    EKG EKG Interpretation  Date/Time:  Sunday October 09 2021 17:31:05 EDT Ventricular Rate:  123 PR Interval:  162 QRS Duration: 98 QT Interval:  323 QTC Calculation: 462 R Axis:   -47 Text Interpretation: Sinus tachycardia Left anterior fascicular block Abnormal R-wave progression, early transition Repolarization  abnormality, prob rate related No previous tracing Confirmed by Blanchie Dessert [id number]) on 10/09/2021 5:45:21 PM  Radiology DG Chest Port 1 View  Result Date: 10/09/2021 CLINICAL DATA:  Shortness of breath, respiratory distress EXAM: PORTABLE CHEST 1 VIEW COMPARISON:  05/13/2014 FINDINGS: Airspace disease throughout both lungs, most  pronounced in the upper lobes bilaterally. Heart is normal size. No effusions or acute bony abnormality. IMPRESSION: Bilateral airspace disease, most pronounced in the upper lobes. Favor infection. Electronically Signed   By: Rolm Baptise M.D.   On: 10/09/2021 17:17    Procedures Procedures   Medications Ordered in ED Medications  cefTRIAXone (ROCEPHIN) 1 g in sodium chloride 0.9 % 100 mL IVPB (1 g Intravenous New Bag/Given 10/09/21 1833)  azithromycin (ZITHROMAX) 500 mg in sodium chloride 0.9 % 250 mL IVPB (has no administration in time range)  vancomycin (VANCOREADY) IVPB 1250 mg/250 mL (has no administration in time range)  nitroGLYCERIN 50 mg in dextrose 5 % 250 mL (0.2 mg/mL) infusion (has no administration in time range)    ED Course  I have reviewed the triage vital signs and the nursing notes.  Pertinent labs & imaging results that were available during my care of the patient were reviewed by me and considered in my medical decision making (see chart for details).    MDM Rules/Calculators/A&P                           Elderly male presenting today with acute respiratory distress.  Only history available at this time as patient complained of chest pain and shortness of breath to 911 dispatch.  Patient was agonal he breathing and hypoxic to the 60s with GCS of 6 when EMS arrived.  Due to intact gag reflex unable to intubate and was bagged in route.  Upon arrival here patient's oxygen saturation was 79%.  He was diaphoretic and tachycardic.  An attempt to improve saturations BiPAP was placed and oxygen saturation improved significantly to 98% with  improvement of the respiratory rate to the high 20s from 40s with persistent tachycardia in the 130s.  Patient does not have distal edema or notable JVD.  Concern for flash pulmonary edema versus infectious etiology.  EKG was sinus tachycardia prolonged QT interval but no old to compare.  ST depression in V5 and 6.  Labs are pending.  Will watch patient closely.  No DNR on file or CODE STATUS.  6:38 PM Spoke with a family member Alferd Patee and she reports that he does have a well but she is not sure if it is a living well and he has never spoken with her about his CODE STATUS.  She reports he lives alone is fairly secluded.  She is his closest living relative.  At this time it was decided if patient decompensates he would want to be intubated. Patient's VBG shows a pH of 7.107 with normal CO2 and bicarb of 15.  Rectal temperature is normal.  CBC with a white count of 21,000.  On reevaluation patient does indicate he still having chest pain blood pressure remains in the 585I systolic and respiratory rate is still in the upper 20s and low 30s.  Heart rate is improving.  Repeat EKG does show ST depression anteriorly and T wave inversion laterally.  Chest x-ray with bilateral airspace disease most pronounced in the upper lobes favoring infection.  We will continue BiPAP at this time as patient is now responsive.  He was covered broadly with antibiotics including Rocephin, azithromycin and vancomycin  6:38 PM After an hour on BiPAP patient is now resting much more comfortably.  Respiratory rate is almost normal, pulse is in the low 100s.  Blood pressure remained stable in the 140s.  Patient is still complaining of chest pain  and was started on low-dose nitroglycerin drip.  Repeat EKG is now showing more concern for global ischemia.  Spoke with on-call STEMI doctor and he evaluated the EKG as well as the known lab work which was acidosis with a pH of 7.1 and leukocytosis of 21,000 and will come down and do a  bedside echo.  At this time Cath Lab was not activated.  We will repeat VBG now that he has been on BiPAP for an hour.  6:39 PM Repeat VBG now wnl.  BNP elevated at 600.  Initial trop of 155.  Initial lactate 8.5.  CMP without electrolyte abnormality and anion gap of 17.  Cards at bedside now doing [place]. Ultrasound with global decreased function.  Also concern for possible aortic stenosis.  Patient's chest pain has improved with IV nitroglycerin however will attempt to keep blood pressure greater than 120 due to concern for possible aortic stenosis.  Heparin was started.  However with elevated lactate, chest x-ray findings and concern for possible infection in addition this may be secondary ischemia.  Blood pressure remained stable.  Patient is still mentating.  COVID is still pending.  Will admit to ICU.  MDM   Amount and/or Complexity of Data Reviewed Clinical lab tests: ordered and reviewed Tests in the radiology section of CPT: ordered and reviewed Tests in the medicine section of CPT: ordered and reviewed Decide to obtain previous medical records or to obtain history from someone other than the patient: yes Obtain history from someone other than the patient: yes Review and summarize past medical records: yes Discuss the patient with other providers: yes Independent visualization of images, tracings, or specimens: yes  Risk of Complications, Morbidity, and/or Mortality Presenting problems: high Diagnostic procedures: high Management options: high  Patient Progress Patient progress: other (comment) (critical)   CRITICAL CARE Performed by: Heman Que Total critical care time: 55 minutes Critical care time was exclusive of separately billable procedures and treating other patients. Critical care was necessary to treat or prevent imminent or life-threatening deterioration. Critical care was time spent personally by me on the following activities: development of treatment plan  with patient and/or surrogate as well as nursing, discussions with consultants, evaluation of patient's response to treatment, examination of patient, obtaining history from patient or surrogate, ordering and performing treatments and interventions, ordering and review of laboratory studies, ordering and review of radiographic studies, pulse oximetry and re-evaluation of patient's condition.   Final Clinical Impression(s) / ED Diagnoses Final diagnoses:  NSTEMI (non-ST elevated myocardial infarction) ([place])  Acute respiratory failure with hypoxia ([place])  Community acquired pneumonia, unspecified laterality    Rx / DC Orders ED Discharge Orders     None        Blanchie Dessert, MD 10/09/21 Einar Crow

## 2021-10-09 NOTE — H&P (Addendum)
NAME:  Nicholas Finley, MRN:  [id number], DOB:  1926/02/24, LOS: 0 ADMISSION DATE:  10/09/2021, CONSULTATION DATE:  10/09/21 REFERRING MD:  Maryan Rued, CHIEF COMPLAINT:  Shortness of breath   History of Present Illness:  Nicholas Finley is a 87 y.o. M with PMH of HTN, HL, who presented with worsening chest pain and shortness of breath over the last two days.  He reports gradually worsening dyspnea over several weeks, but acutely worse today.  He does occasionally smoke, but does not have a history of COPD or asthma.   He denies fevers at home or known ill contacts.  No LE edema On EMS arrival, pt apparently had oxygen saturations in the 60%'s and EKG with concern for ST depressions in inferior and lateral leads.     On arrival to the ED, patient was placed on Bipap with improvement.  He was started on a Nitro gtt for chest pain and HTN.  CXR with bilateral airspace disease and labs significant for WBC of 21k, troponin 155->1071, lactic acid 8.5.  He was seen by cardiology and started on Asa and heparin gtt and PCCM consulted for admission  Pertinent  Medical History   has a past medical history of Allergy, Cataract, Chest pain (10/09/2021), Enlarged prostate, Heart murmur, Hypercholesteremia, Hypertension, and Sepsis with acute hypoxic respiratory failure and septic shock ([place]) (10/09/2021).   Significant Hospital Events: Including procedures, antibiotic start and stop dates in addition to other pertinent events   10/16 presented with CP/dyspnea, on Bipap, cards consult, likely NSTEMI, PCCM admit  Interim History / Subjective:  Pt is seen awake and resting comfortably on Bipap, oriented and able to answer questions  Objective   Blood pressure 107/75, pulse (!) 105, temperature 98.6 F (37 C), temperature source Rectal, resp. rate 16, SpO2 92 %.    Vent Mode: PCV;BIPAP FiO2 (%):  [50 %-100 %] 50 % Set Rate:  [10 bmp] 10 bmp PEEP:  [6 cmH20-7 cmH20] 6 cmH20   Intake/Output Summary (Last  24 hours) at 10/09/2021 2008 Last data filed at 10/09/2021 1920 Gross per 24 hour  Intake 100 ml  Output --  Net 100 ml   There were no vitals filed for this visit.  General:  elderly M, well nourished, awake and in no distress HEENT: MM pink/moist, bipap in place Neuro: awake, moving all extremities and answering questions appropriately  CV: s1s2 rrr, no m/r/g PULM:  decreased air movement throughout without significant rhonchi or wheezing, on Bipap 50% FiO2 12/6 GI: soft, bsx4 active  Extremities: warm/dry, no edema  Skin: no rashes or lesions   Resolved Hospital Problem list     Assessment & Plan:   Acute Hypoxic Respiratory Failure  Concern for PNA Sepsis NSTEMI vs. PNA likely both P: -Continue Bipap, patient tolerating well -Vanc/cefepime for possible sepsis -respiratory and blood cultures, extended RVP -repeat ABG in the AM -check UA -CXR in AM -appears euvolemic clinically, hold further IVF for now -Consider PE, though per cards no significant RA dilation on bedside echo, check LE dopplers      NSTEMI/Demand Ischemia Management per cardiology P: -echo, cardiology noted global LV dysfunction on POCUS -defer diuresis to cards -Asa, heparin gtt -trend troponin -closely monitor hemodynamics   Acute Kidney Injury Mild elevation in creatinine 1.4, baseline normal P: -follow renal indices, electrolytes and UOP -avoid nephrotoxins    Hyperglycemia Denies DM at baseline P: -SSI and check Hgb A1c  AGMA Likely secondary to significant lactic acidosis P: -treat primary problems and  follow repeat BMP   Elevated Transaminsases Mild, Likely secondary to shock P: -monitor  Best Practice (right click and "Reselect all SmartList Selections" daily)   Diet/type: NPO w/ oral meds DVT prophylaxis: systemic heparin GI prophylaxis: N/A Lines: N/A Foley:  N/A Code Status:  full code Last date of multidisciplinary goals of care discussion  [pending]  Labs   CBC: Recent Labs  Lab 10/09/21 1710 10/09/21 1720 10/09/21 1834  WBC 21.1*  --   --   NEUTROABS 8.8*  --   --   HGB 14.0 15.0 15.6  HCT 46.0 44.0 46.0  MCV 102.9*  --   --   PLT 318  --   --     Basic Metabolic Panel: Recent Labs  Lab 10/09/21 1710 10/09/21 1720 10/09/21 1834  NA 139 139 140  K 4.8 4.5 4.3  CL 107  --   --   CO2 15*  --   --   GLUCOSE 295*  --   --   BUN 23  --   --   CREATININE 1.47*  --   --   CALCIUM 8.8*  --   --    GFR: CrCl cannot be calculated (Unknown ideal weight.). Recent Labs  Lab 10/09/21 1710  WBC 21.1*  LATICACIDVEN 8.5*    Liver Function Tests: Recent Labs  Lab 10/09/21 1710  AST 114*  ALT 66*  ALKPHOS 70  BILITOT 0.9  PROT 6.9  ALBUMIN 3.3*   No results for input(s): LIPASE, AMYLASE in the last 168 hours. No results for input(s): AMMONIA in the last 168 hours.  ABG    Component Value Date/Time   HCO3 22.0 10/09/2021 1834   TCO2 23 10/09/2021 1834   ACIDBASEDEF 5.0 (H) 10/09/2021 1834   O2SAT 64.0 10/09/2021 1834     Coagulation Profile: No results for input(s): INR, PROTIME in the last 168 hours.  Cardiac Enzymes: No results for input(s): CKTOTAL, CKMB, CKMBINDEX, TROPONINI in the last 168 hours.  HbA1C: Hgb A1c MFr Bld  Date/Time Value Ref Range Status  09/07/2021 11:36 AM 6.0 4.6 - 6.5 % Final    Comment:    Glycemic Control Guidelines for People with Diabetes:Non Diabetic:  <6%Goal of Therapy: <7%Additional Action Suggested:  >8%   03/16/2021 09:59 AM 5.8 (H) 4.8 - 5.6 % Final    Comment:             Prediabetes: 5.7 - 6.4          Diabetes: >6.4          Glycemic control for adults with diabetes: <7.0     CBG: Recent Labs  Lab 10/09/21 1728  GLUCAP 265*    Review of Systems:   Review of Systems  Constitutional:  Positive for malaise/fatigue. Negative for chills and fever.  Respiratory:  Positive for cough, sputum production and shortness of breath. Negative for  wheezing.   Cardiovascular:  Positive for chest pain, claudication, leg swelling and PND.  Gastrointestinal:  Negative for nausea and vomiting.  Musculoskeletal:  Negative for myalgias.    Past Medical History:  He,  has a past medical history of Allergy, Cataract, Chest pain (10/09/2021), Enlarged prostate, Heart murmur, Hypercholesteremia, Hypertension, and Sepsis with acute hypoxic respiratory failure and septic shock ([place]) (10/09/2021).   Surgical History:   Past Surgical History:  Procedure Laterality Date   CYST EXCISION     pilonidal   SKIN CANCER EXCISION       Social History:  reports that he has been smoking cigarettes, pipe, and cigars. He has never used smokeless tobacco. He reports current alcohol use. He reports that he does not use drugs.   Family History:  His family history includes Heart Problems in his sister; Heart failure in his father.   Allergies No Known Allergies   Home Medications  Prior to Admission medications   Medication Sig Start Date End Date Taking? Authorizing Provider  aspirin 81 MG tablet Take 81 mg by mouth daily.    [provider]  atorvastatin (LIPITOR) 10 MG tablet Take 1 tablet (10 mg total) by mouth daily. 09/07/21   [name], MD  Azelastine HCl (ASTEPRO) 0.15 % SOLN instill 1 spray into each nostril twice a day if needed 03/10/19   [name], MD  lisinopril (ZESTRIL) 10 MG tablet Take 1 tablet (10 mg total) by mouth daily. 09/07/21   [name], MD     Critical care time: 45 minutes    CRITICAL CARE Performed by: Otilio Carpen Myshawn [place]   Total critical care time: 45 minutes  Critical care time was exclusive of separately billable procedures and treating other patients.  Critical care was necessary to treat or prevent imminent or life-threatening deterioration.  Critical care was time spent personally by me on the following activities: development of treatment plan with patient and/or surrogate as well  as nursing, discussions with consultants, evaluation of patient's response to treatment, examination of patient, obtaining history from patient or surrogate, ordering and performing treatments and interventions, ordering and review of laboratory studies, ordering and review of radiographic studies, pulse oximetry and re-evaluation of patient's condition.  Otilio Carpen Dequane Strahan, PA-C [facility] See Amion for pager If no response to pager , please call [phone] until 7pm After 7:00 pm call Elink  [phone]?[place]

## 2021-10-09 NOTE — Progress Notes (Signed)
Pharmacy Antibiotic Note  Nicholas Finley is a 87 y.o. male admitted on 10/09/2021 presenting in respiratory distress, concern for pna.  Pharmacy has been consulted for vancomycin dosing.  Ceftriaxone/azithro per MD  Plan: Vancomycin 1250 mg IV x 1, then 1000 mg q 36h (eAUC 488, Goal AUC 400-550, SCr 1.47) Add MRSA PCR Monitor renal function, Cx/PCR to narrow Vancomycin levels as needed      Temp (24hrs), Avg:98.6 F (37 C), Min:98.6 F (37 C), Max:98.6 F (37 C)  Recent Labs  Lab 10/09/21 1710  WBC 21.1*  CREATININE 1.47*  LATICACIDVEN 8.5*    CrCl cannot be calculated (Unknown ideal weight.).    No Known Allergies  Bertis Ruddy, PharmD Clinical Pharmacist ED Pharmacist Phone # [phone] 10/09/2021 7:17 PM

## 2021-10-09 NOTE — Progress Notes (Signed)
ANTICOAGULATION CONSULT NOTE - Initial Consult  Pharmacy Consult for heparin Indication: chest pain/ACS  No Known Allergies  Patient Measurements:   Heparin Dosing Weight: 70kg  Vital Signs: Temp: 98.6 F (37 C) (10/16 1738) Temp Source: Rectal (10/16 1738) BP: 130/82 (10/16 1900) Pulse Rate: 106 (10/16 1900)  Labs: Recent Labs    10/09/21 1710 10/09/21 1720 10/09/21 1834  HGB 14.0 15.0 15.6  HCT 46.0 44.0 46.0  PLT 318  --   --   CREATININE 1.47*  --   --   TROPONINIHS 155*  --   --     CrCl cannot be calculated (Unknown ideal weight.).   Medical History: Past Medical History:  Diagnosis Date   Allergy    Cataract    Chest pain 10/09/2021   Enlarged prostate    Heart murmur    Hypercholesteremia    Hypertension    Sepsis with acute hypoxic respiratory failure and septic shock ([place]) 10/09/2021    Assessment: 89 YOM presenting with respiratory distress, elevated troponin, he is not on anticoagulation PTA.  CBC wnl  Goal of Therapy:  Heparin level 0.3-0.7 units/ml Monitor platelets by anticoagulation protocol: Yes   Plan:  Heparin 4000 units IV x 1, and gtt at 800 units/hr F/u 8 hour heparin level  Bertis Ruddy, PharmD Clinical Pharmacist ED Pharmacist Phone # [phone] 10/09/2021 7:28 PM

## 2021-10-09 NOTE — ED Notes (Signed)
Dr Maryan Rued aware of Lactic and Troponin.

## 2021-10-09 NOTE — Progress Notes (Signed)
Pt transported from ED 21 to [place] 21 on bipap w/o event.

## 2021-10-09 NOTE — ED Notes (Signed)
Paged [place] for Dr. Maryan Rued at 18:00

## 2021-10-09 NOTE — ED Triage Notes (Signed)
Pt bib ems from home after pt called out for chest pain and shob. Pt gcs on scene 10. Pt with accessory muscle use, being bagged on arrival. Pt oxygen saturation 67% while being bagged. Rales noted bilaterally. 20g LFA.

## 2021-10-10 ENCOUNTER — Inpatient Hospital Stay (HOSPITAL_COMMUNITY): Payer: Medicare Other

## 2021-10-10 DIAGNOSIS — J810 Acute pulmonary edema: Secondary | ICD-10-CM

## 2021-10-10 DIAGNOSIS — D72829 Elevated white blood cell count, unspecified: Secondary | ICD-10-CM | POA: Diagnosis not present

## 2021-10-10 DIAGNOSIS — J9601 Acute respiratory failure with hypoxia: Secondary | ICD-10-CM | POA: Diagnosis not present

## 2021-10-10 DIAGNOSIS — M79604 Pain in right leg: Secondary | ICD-10-CM

## 2021-10-10 DIAGNOSIS — M79605 Pain in left leg: Secondary | ICD-10-CM

## 2021-10-10 DIAGNOSIS — I509 Heart failure, unspecified: Secondary | ICD-10-CM | POA: Diagnosis not present

## 2021-10-10 DIAGNOSIS — I214 Non-ST elevation (NSTEMI) myocardial infarction: Secondary | ICD-10-CM

## 2021-10-10 DIAGNOSIS — J189 Pneumonia, unspecified organism: Secondary | ICD-10-CM | POA: Diagnosis not present

## 2021-10-10 DIAGNOSIS — I248 Other forms of acute ischemic heart disease: Secondary | ICD-10-CM | POA: Diagnosis not present

## 2021-10-10 DIAGNOSIS — J811 Chronic pulmonary edema: Secondary | ICD-10-CM

## 2021-10-10 LAB — RESPIRATORY PANEL BY PCR

## 2021-10-10 LAB — PHOSPHORUS: Phosphorus: 4.4 mg/dL (ref 2.5–4.6)

## 2021-10-10 LAB — POCT I-STAT 7, (LYTES, BLD GAS, ICA,H+H)
Acid-base deficit: 4 mmol/L — ABNORMAL HIGH (ref 0.0–2.0)
Bicarbonate: 20.7 mmol/L (ref 20.0–28.0)
Calcium, Ion: 1.18 mmol/L (ref 1.15–1.40)
HCT: 40 % (ref 39.0–52.0)
Hemoglobin: 13.6 g/dL (ref 13.0–17.0)
O2 Saturation: 99 %
Patient temperature: 97.6
Potassium: 3.8 mmol/L (ref 3.5–5.1)
Sodium: 140 mmol/L (ref 135–145)
TCO2: 22 mmol/L (ref 22–32)
pCO2 arterial: 35.6 mmHg (ref 32.0–48.0)
pH, Arterial: 7.371 (ref 7.350–7.450)
pO2, Arterial: 123 mmHg — ABNORMAL HIGH (ref 83.0–108.0)

## 2021-10-10 LAB — BASIC METABOLIC PANEL
Anion gap: 10 (ref 5–15)
Anion gap: 12 (ref 5–15)
BUN: 28 mg/dL — ABNORMAL HIGH (ref 8–23)
BUN: 28 mg/dL — ABNORMAL HIGH (ref 8–23)
CO2: 18 mmol/L — ABNORMAL LOW (ref 22–32)
CO2: 21 mmol/L — ABNORMAL LOW (ref 22–32)
Calcium: 8.3 mg/dL — ABNORMAL LOW (ref 8.9–10.3)
Calcium: 8.6 mg/dL — ABNORMAL LOW (ref 8.9–10.3)
Chloride: 108 mmol/L (ref 98–111)
Chloride: 108 mmol/L (ref 98–111)
Creatinine, Ser: 0.93 mg/dL (ref 0.61–1.24)
Creatinine, Ser: 0.83 mg/dL (ref 0.61–1.24)
GFR, Estimated: >60 mL/min (ref >60)
GFR, Estimated: >60 mL/min (ref >60)
Glucose, Bld: 153 mg/dL — ABNORMAL HIGH (ref 70–99)
Glucose, Bld: 120 mg/dL — ABNORMAL HIGH (ref 70–99)
Potassium: 4.1 mmol/L (ref 3.5–5.1)
Potassium: 3.4 mmol/L — ABNORMAL LOW (ref 3.5–5.1)
Sodium: 136 mmol/L (ref 135–145)
Sodium: 141 mmol/L (ref 135–145)

## 2021-10-10 LAB — ECHOCARDIOGRAM COMPLETE
AR max vel: 0.79 cm2
AV Area VTI: 0.67 cm2
AV Area mean vel: 0.68 cm2
AV Mean grad: 25.3 mmHg
AV Peak grad: 40.1 mmHg
Ao pk vel: 3.17 m/s
Area-P 1/2: 3.6 cm2
S' Lateral: 4.2 cm
Weight: 2384 oz

## 2021-10-10 LAB — CBC
HCT: 43.4 % (ref 39.0–52.0)
Hemoglobin: 14.2 g/dL (ref 13.0–17.0)
MCH: 31.3 pg (ref 26.0–34.0)
MCHC: 32.7 g/dL (ref 30.0–36.0)
MCV: 95.8 fL (ref 80.0–100.0)
Platelets: 265 10*3/uL (ref 150–400)
RBC: 4.53 MIL/uL (ref 4.22–5.81)
RDW: 14.1 % (ref 11.5–15.5)
WBC: 29.5 10*3/uL — ABNORMAL HIGH (ref 4.0–10.5)
nRBC: 0 % (ref 0.0–0.2)

## 2021-10-10 LAB — BRAIN NATRIURETIC PEPTIDE: B Natriuretic Peptide: 1802.6 pg/mL — ABNORMAL HIGH (ref 0.0–100.0)

## 2021-10-10 LAB — GLUCOSE, CAPILLARY
Glucose-Capillary: 107 mg/dL — ABNORMAL HIGH (ref 70–99)
Glucose-Capillary: 108 mg/dL — ABNORMAL HIGH (ref 70–99)
Glucose-Capillary: 151 mg/dL — ABNORMAL HIGH (ref 70–99)
Glucose-Capillary: 163 mg/dL — ABNORMAL HIGH (ref 70–99)
Glucose-Capillary: 96 mg/dL (ref 70–99)
Glucose-Capillary: 99 mg/dL (ref 70–99)

## 2021-10-10 LAB — LACTIC ACID, PLASMA
Lactic Acid, Venous: 2.2 mmol/L (ref 0.5–1.9)
Lactic Acid, Venous: 1.7 mmol/L (ref 0.5–1.9)

## 2021-10-10 LAB — HEPARIN LEVEL (UNFRACTIONATED)
Heparin Unfractionated: <0.1 IU/mL — ABNORMAL LOW (ref 0.30–0.70)
Heparin Unfractionated: 0.31 IU/mL (ref 0.30–0.70)

## 2021-10-10 LAB — MAGNESIUM: Magnesium: 2.2 mg/dL (ref 1.7–2.4)

## 2021-10-10 LAB — TROPONIN I (HIGH SENSITIVITY): Troponin I (High Sensitivity): 20610 ng/L (ref <18)

## 2021-10-10 LAB — MRSA NEXT GEN BY PCR, NASAL: MRSA by PCR Next Gen: NOT DETECTED

## 2021-10-10 LAB — PROCALCITONIN: Procalcitonin: 4.07 ng/mL

## 2021-10-10 MED ORDER — NITROGLYCERIN 0.4 MG SL SUBL
0.4000 mg | SUBLINGUAL_TABLET | SUBLINGUAL | Status: DC | PRN
  Administered 2021-10-11 – 2021-10-16 (×4): 0.4 mg via SUBLINGUAL
  Filled 2021-10-10 (×2): qty 1

## 2021-10-10 MED ORDER — FUROSEMIDE 10 MG/ML IJ SOLN
40.0000 mg | Freq: Once | INTRAMUSCULAR | Status: AC
  Administered 2021-10-10: 40 mg via INTRAVENOUS
  Filled 2021-10-10: qty 4

## 2021-10-10 MED ORDER — HEPARIN BOLUS VIA INFUSION
2500.0000 [IU] | Freq: Once | INTRAVENOUS | Status: AC
  Administered 2021-10-10: 2500 [IU] via INTRAVENOUS
  Filled 2021-10-10: qty 2500

## 2021-10-10 MED ORDER — HEPARIN (PORCINE) 25000 UT/250ML-% IV SOLN: 1000.0000 [IU]/h | INTRAVENOUS | Status: DC

## 2021-10-10 MED ORDER — PERFLUTREN LIPID MICROSPHERE
1.0000 mL | INTRAVENOUS | Status: AC | PRN
  Filled 2021-10-10: qty 10

## 2021-10-10 MED ORDER — CHLORHEXIDINE GLUCONATE CLOTH 2 % EX PADS
6.0000 | MEDICATED_PAD | Freq: Every day | CUTANEOUS | Status: DC
  Administered 2021-10-10 – 2021-10-13 (×3): 6 via TOPICAL

## 2021-10-10 MED ORDER — SODIUM CHLORIDE 0.9 % IV SOLN
2.0000 g | Freq: Two times a day (BID) | INTRAVENOUS | Status: DC
  Administered 2021-10-10 – 2021-10-12 (×4): 2 g via INTRAVENOUS
  Filled 2021-10-10 (×4): qty 2

## 2021-10-10 MED ORDER — VANCOMYCIN HCL IN DEXTROSE 1-5 GM/200ML-% IV SOLN: 1000.0000 mg | INTRAVENOUS | Status: DC

## 2021-10-10 NOTE — Progress Notes (Signed)
ANTICOAGULATION CONSULT NOTE  Pharmacy Consult for heparin Indication: chest pain/ACS  No Known Allergies  Patient Measurements: Weight: 67.6 kg (149 lb) Heparin Dosing Weight: 70kg  Vital Signs: Temp: 97.6 F (36.4 C) (10/16 2300) Temp Source: Axillary (10/16 2300) BP: 122/78 (10/17 0000) Pulse Rate: 87 (10/17 0000)  Labs: Recent Labs    10/09/21 1710 10/09/21 1720 10/09/21 1834 10/09/21 1921 10/10/21 0130  HGB 14.0 15.0 15.6  --  14.2  HCT 46.0 44.0 46.0  --  43.4  PLT 318  --   --   --  265  HEPARINUNFRC  --   --   --   --  <0.10*  CREATININE 1.47*  --   --   --  0.93  TROPONINIHS 155*  --   --  1,071*  --      Estimated Creatinine Clearance: 44 mL/min (by C-G formula based on SCr of 0.93 mg/dL).   Medical History: Past Medical History:  Diagnosis Date   Allergy    Cataract    Chest pain 10/09/2021   Enlarged prostate    Heart murmur    Hypercholesteremia    Hypertension    Sepsis with acute hypoxic respiratory failure and septic shock ([place]) 10/09/2021    Assessment: 89 YOM presenting with respiratory distress, elevated troponin, he is not on anticoagulation PTA.  CBC wnl. Heparin started for ACS.  Heparin level undetectable on gtt at 800 units/hr. Noted level drawn ~5.5 hours post bolus and gtt start. No issues with line or bleeding reported per RN.  Goal of Therapy:  Heparin level 0.3-0.7 units/ml Monitor platelets by anticoagulation protocol: Yes   Plan:  Rebolus heparin IV 2500 units Increase heparin gtt to 1000 units/hr F/u 8 hour heparin level  Sherlon Handing, PharmD, BCPS Please see amion for complete clinical pharmacist phone list 10/10/2021 3:09 AM

## 2021-10-10 NOTE — Progress Notes (Addendum)
ANTICOAGULATION CONSULT NOTE  Pharmacy Consult for heparin Indication: chest pain/ACS  No Known Allergies  Patient Measurements: Weight: 67.6 kg (149 lb) Heparin Dosing Weight: 70kg  Vital Signs: Temp: 98 F (36.7 C) (10/17 1122) Temp Source: Oral (10/17 1122) BP: 130/78 (10/17 0600) Pulse Rate: 79 (10/17 1026)  Labs: Recent Labs    10/09/21 1710 10/09/21 1720 10/09/21 1834 10/09/21 1921 10/10/21 0130 10/10/21 0408 10/10/21 1051  HGB 14.0   < > 15.6  --  14.2 13.6  --   HCT 46.0   < > 46.0  --  43.4 40.0  --   PLT 318  --   --   --  265  --   --   HEPARINUNFRC  --   --   --   --  <0.10*  --  0.31  CREATININE 1.47*  --   --   --  0.Nicholas  --   --   TROPONINIHS 155*  --   --  1,071*  --   --   --    < > = values in this interval not displayed.     Estimated Creatinine Clearance: 44 mL/min (by C-G formula based on SCr of 0.Nicholas mg/dL).   Medical History: Past Medical History:  Diagnosis Date   Allergy    Cataract    Chest pain 10/09/2021   Enlarged prostate    Heart murmur    Hypercholesteremia    Hypertension    Sepsis with acute hypoxic respiratory failure and septic shock ([place]) 10/09/2021    Assessment: Nicholas Finley presenting with respiratory distress, elevated troponin, he is not on anticoagulation PTA.  CBC wnl. Heparin started for ACS.  Heparin level therapeutic at low end of goal range at 0.31 - will likely drop after bolus wears off. CBC stable, no S/Sx bleeding.  Goal of Therapy:  Heparin level 0.3-0.7 units/ml Monitor platelets by anticoagulation protocol: Yes   Plan:  Increase heparin to 1100 units/h Recheck heparin level w/ am labs  ADDENDUM: pt noted to have abrupt hematuria this afternoon. Will keep heparin at 1000 units/h for now and not increase as planned previously.   Arrie Senate, PharmD, BCPS, [facility] Clinical Pharmacist [phone] Please check AMION for all [place] numbers 10/10/2021

## 2021-10-10 NOTE — Progress Notes (Addendum)
NAME:  Nicholas Finley, MRN:  [id number], DOB:  [birth year], LOS: 1 ADMISSION DATE:  10/09/2021, CONSULTATION DATE:  10/10/21 REFERRING MD:  Maryan Rued, CHIEF COMPLAINT:  Shortness of breath   History of Present Illness:  Mr. Nicholas Finley is a 87 y.o. M with PMH of HTN, HL, who presented with worsening chest pain and shortness of breath over the last two days.  He reports gradually worsening dyspnea over several weeks, but acutely worse day of admit.  On EMS arrival, pt apparently had oxygen saturations in the 60%'s and EKG with concern for ST depressions in inferior and lateral leads.      Pertinent  Medical History   has a past medical history of Allergy, Cataract, Chest pain (10/09/2021), Enlarged prostate, Heart murmur, Hypercholesteremia, Hypertension, and Sepsis with acute hypoxic respiratory failure and septic shock ([place]) (10/09/2021). Occasional smoker. No known h/o COPD  Significant Hospital Events: Including procedures, antibiotic start and stop dates in addition to other pertinent events   10/16 presented with CP/dyspnea: likely NSTEMI, On arrival to the ED, patient was placed on Bipap with improvement.  He was started on a Nitro gtt for chest pain and HTN.  CXR with bilateral airspace disease and labs significant for WBC of 21k, troponin 155->1071, lactic acid 8.5.  He was seen by cardiology and started on Asa and heparin gtt.  Vanco and cefepime started empirically.  BNP 625. NTG stopped d/t BP 9/17 respiratory viral panel negative. IV lasix given   Interim History / Subjective:  He feels better.  Certainly feels like the BiPAP is helping.  He is anxious about having it removed and having difficulty breathing.  Still having some mild chest discomfort  Objective   Blood pressure 130/78, pulse 86, temperature (Abnormal) 96.7 F (35.9 C), temperature source Axillary, resp. rate (Abnormal) 21, weight 67.6 kg, SpO2 100 %.    Vent Mode: BIPAP FiO2 (%):  [50 %-100 %] 50 % Set Rate:  [10  bmp-15 bmp] 15 bmp PEEP:  [6 cmH20-7 cmH20] 6 cmH20   Intake/Output Summary (Last 24 hours) at 10/10/2021 0914 Last data filed at 10/10/2021 0800 Gross per 24 hour  Intake 843.64 ml  Output 450 ml  Net 393.64 ml    Filed Weights   10/09/21 2105  Weight: 67.6 kg   General this is a pleasant 87 year old nontoxic-appearing white male he is currently on noninvasive positive pressure ventilation and in no acute distress HEENT BiPAP mask in place no clear jugular venous distention Pulmonary: Faint basilar crackles, no current accessory use.  Currently pulse oximetry 100% on 50% FiO2 on noninvasive Cardiac: Faint systolic murmur, regular rhythm Abdomen: Soft nontender no organomegaly Extremities: Warm dry pulses palpable no significant edema Neuro: Awake oriented no focal deficits   Resolved Hospital Problem list     Assessment & Plan:   Acute Hypoxic Respiratory Failure secondary to diffuse pulmonary edema, +/- PNA;  but HPI not really suggestive of this.  Portable chest x-ray personally reviewed: Bilateral airspace disease, diffuse in nature, perhaps a little bit worse when comparing Plan Continuing BiPAP, can change to as needed after IV Lasix Pulse oximetry Sat goal greater than 90 IV Lasix Continue IV vancomycin and cefepime for now, check procalcitonin A.m. chest x-ray Lower extremity ultrasound ordered, however low suspicion for DVT as hypoxia fairly well explained by chest imaging  NSTEMI/Demand Ischemia, with acute systolic heart failure Aortic stenosis -echo, cardiology noted global LV dysfunction on POCUS Plan Continue telemetry  48 hours of IV heparin Continue aspirin  IV nitro for chest pain, but keeping blood pressure goal with systolic around 979 and would not drop lower given concern for aortic stenosis (on hold currently)  Low-dose Lopressor 25 mg twice daily Atorvastatin Eventually needs cardiac catheterization if we are to continue aggressive therapy;  deferring this to cardiology  Leukocytosis  -aspiration vs reactive Plan Cont to monitor   Lactic acidosis.  More inclined to think this is secondary to work of breathing and actually sepsis.  Cleared with ventilatory support via BiPAP Plan Repeat lactate  Acute Kidney Injury.  Cleared Mild elevation in creatinine 1.4, baseline normal Plan Follow-up a.m. chemistry given IV diuresis   Hyperglycemia Denies DM at baseline, hemoglobin A1c normal Plan Sliding scale insulin   Elevated Transaminsases Mild, Likely secondary hepatic congestion from heart failure Plan A.m. LFTs  Best Practice (right click and "Reselect all SmartList Selections" daily)   Diet/type: NPO w/ oral meds DVT prophylaxis: systemic heparin GI prophylaxis: N/A Lines: N/A Foley:  N/A Code Status:  full code Last date of multidisciplinary goals of care discussion [pending]  My cct 34 min Erick Colace ACNP-BC [place] Pager # [phone] OR # [phone] if no answer

## 2021-10-10 NOTE — Progress Notes (Signed)
Lower extremity venous has been completed.   Preliminary results in CV Proc.   Jana Swartzlander Aidaly Cordner 10/10/2021 2:00 PM

## 2021-10-10 NOTE — Progress Notes (Signed)
Progress Note  Patient Name: Nicholas Finley Date of Encounter: 10/10/2021  [facility] Cardiologist: None   Subjective   Mild chest discomfort noted.  Breathing better with positive pressure mask.  Inpatient Medications    Scheduled Meds:  aspirin EC  81 mg Oral Daily   aspirin  325 mg Oral Once   atorvastatin  40 mg Oral Daily   Chlorhexidine Gluconate Cloth  6 each Topical Daily   insulin aspart  0-9 Units Subcutaneous Q4H   metoprolol tartrate  25 mg Oral BID   Continuous Infusions:  ceFEPime (MAXIPIME) IV Stopped (10/09/21 2330)   heparin 1,000 Units/hr (10/10/21 0800)   nitroGLYCERIN Stopped (10/09/21 2021)   [START ON 10/11/2021] vancomycin     PRN Meds: docusate sodium, polyethylene glycol   Vital Signs    Vitals:   10/10/21 0500 10/10/21 0600 10/10/21 0742 10/10/21 0842  BP: 121/73 130/78    Pulse: 84 88  86  Resp: 17 18  (!) 21  Temp:   (!) 96.7 F (35.9 C)   TempSrc:   Axillary   SpO2: 99% 99%  100%  Weight:        Intake/Output Summary (Last 24 hours) at 10/10/2021 0902 Last data filed at 10/10/2021 0800 Gross per 24 hour  Intake 843.64 ml  Output 450 ml  Net 393.64 ml   Last 3 Weights 10/09/2021 09/07/2021 03/09/2021  Weight (lbs) 149 lb 149 lb 148 lb  Weight (kg) 67.586 kg 67.586 kg 67.132 kg      Telemetry    Normal sinus rhythm- Personally Reviewed  ECG    Normal sinus rhythm, anterior T wave inversions- Personally Reviewed  Physical Exam   GEN: No acute distress.   Neck: No JVD Cardiac: RRR, no murmurs, rubs, or gallops.  Respiratory: Clear to auscultation bilaterally. GI: Soft, nontender, non-distended  MS: No edema; No deformity. Neuro:  Nonfocal  Psych: Normal affect   Labs    High Sensitivity Troponin:   Recent Labs  Lab 10/09/21 1710 10/09/21 1921  TROPONINIHS 155* 1,071*     Chemistry Recent Labs  Lab 10/09/21 1710 10/09/21 1720 10/09/21 1834 10/10/21 0130 10/10/21 0408  NA 139   < > 140 136 140   K 4.8   < > 4.3 4.1 3.8  CL 107  --   --  108  --   CO2 15*  --   --  18*  --   GLUCOSE 295*  --   --  153*  --   BUN 23  --   --  28*  --   CREATININE 1.47*  --   --  0.93  --   CALCIUM 8.8*  --   --  8.3*  --   MG  --   --   --  2.2  --   PROT 6.9  --   --   --   --   ALBUMIN 3.3*  --   --   --   --   AST 114*  --   --   --   --   ALT 66*  --   --   --   --   ALKPHOS 70  --   --   --   --   BILITOT 0.9  --   --   --   --   GFRNONAA 44*  --   --  >60  --   ANIONGAP 17*  --   --  10  --    < > =  values in this interval not displayed.    Lipids No results for input(s): CHOL, TRIG, HDL, LABVLDL, LDLCALC, CHOLHDL in the last 168 hours.  Hematology Recent Labs  Lab 10/09/21 1710 10/09/21 1720 10/09/21 1834 10/10/21 0130 10/10/21 0408  WBC 21.1*  --   --  29.5*  --   RBC 4.47  --   --  4.53  --   HGB 14.0   < > 15.6 14.2 13.6  HCT 46.0   < > 46.0 43.4 40.0  MCV 102.9*  --   --  95.8  --   MCH 31.3  --   --  31.3  --   MCHC 30.4  --   --  32.7  --   RDW 14.1  --   --  14.1  --   PLT 318  --   --  265  --    < > = values in this interval not displayed.   Thyroid No results for input(s): TSH, FREET4 in the last 168 hours.  BNP Recent Labs  Lab 10/09/21 1710  BNP 625.3*    DDimer No results for input(s): DDIMER in the last 168 hours.   Radiology    DG Chest Port 1 View  Result Date: 10/10/2021 CLINICAL DATA:  Shortness of breath EXAM: PORTABLE CHEST 1 VIEW COMPARISON:  Chest radiograph 1 day prior FINDINGS: The cardiomediastinal silhouette is stable, with unchanged calcified atherosclerotic plaque of the aortic arch. Patchy airspace disease is again seen throughout both lungs. Opacity in the medial right base has worsened since the study from 1 day prior. Aeration of the lungs is otherwise not significantly changed. There is no significant pleural effusion. There is no pneumothorax. The bones are stable. IMPRESSION: Worsened aeration in the medial right base since the  study from 1 day prior. Patchy airspace disease throughout the remainder of the lungs is otherwise not significantly changed. Findings may reflect infection or pulmonary edema. Electronically Signed   By: Valetta Mole M.D.   On: 10/10/2021 08:24   DG Chest Port 1 View  Result Date: 10/09/2021 CLINICAL DATA:  Shortness of breath, respiratory distress EXAM: PORTABLE CHEST 1 VIEW COMPARISON:  05/13/2014 FINDINGS: Airspace disease throughout both lungs, most pronounced in the upper lobes bilaterally. Heart is normal size. No effusions or acute bony abnormality. IMPRESSION: Bilateral airspace disease, most pronounced in the upper lobes. Favor infection. Electronically Signed   By: Rolm Baptise M.D.   On: 10/09/2021 17:17    Cardiac Studies   Troponins rising  Patient Profile     87 y.o. male with pneumonia, demand ischemia  Assessment & Plan    Abnormal ECG/elevated troponin: likely demand ischemia in the setting of pneumonia. Await echo to determine significance of aortic stenosis and LV function.  ECG suggestive of LAD stenosis.  Troponin has increased significantly.  I suspect given his age that he likely has significant CAD.  Would plan for cardiac cath based on his recovery from his pneumonia and general improvement in his respiratory status.  Still requiring positive pressure via mask to help with respiratory status.  IV heparin.  ABx for pneumonia.   Hyperlipidemia: Now on statin at higher dose, increased in the setting of increased troponin.   Creatinine improved.     For questions or updates, please contact [place] Please consult [email or web address] for contact info under        Signed, Larae Grooms, MD  10/10/2021, 9:02 AM

## 2021-10-10 NOTE — Progress Notes (Signed)
Pt removed from BIPAP and placed 6L HFNC. Pt WOB WNL. RN at bedside. RT to continue to monitor.

## 2021-10-10 NOTE — Progress Notes (Signed)
Pharmacy Antibiotic Note  Nicholas Finley is a 87 y.o. male admitted on 10/09/2021 presenting in respiratory distress, concern for pna.  Pharmacy has been consulted for vancomycin and cefepime dosing. Cr has improved overnight.  Plan: Adjust vancomycin to 1000mg  IV q24h - est AUC 515 Adjust cefepime to 2g IV q12h   Weight: 67.6 kg (149 lb)  Temp (24hrs), Avg:97.8 F (36.6 C), Min:96.7 F (35.9 C), Max:98.6 F (37 C)  Recent Labs  Lab 10/09/21 1710 10/09/21 1921 10/10/21 0130  WBC 21.1*  --  29.5*  CREATININE 1.47*  --  0.93  LATICACIDVEN 8.5* 3.4*  --      Estimated Creatinine Clearance: 44 mL/min (by C-G formula based on SCr of 0.93 mg/dL).    No Known Allergies   Arrie Senate, PharmD, BCPS, [facility] Clinical Pharmacist [phone] Please check AMION for all [place] numbers 10/10/2021

## 2021-10-11 ENCOUNTER — Inpatient Hospital Stay (HOSPITAL_COMMUNITY): Payer: Medicare Other

## 2021-10-11 ENCOUNTER — Encounter (HOSPITAL_COMMUNITY)
Admission: EM | Disposition: A | Discharge status: Home Health | Payer: Self-pay | Source: Home / Self Care | Attending: Student

## 2021-10-11 ENCOUNTER — Encounter (HOSPITAL_COMMUNITY): Payer: Self-pay | Admitting: Cardiology

## 2021-10-11 DIAGNOSIS — I5021 Acute systolic (congestive) heart failure: Secondary | ICD-10-CM

## 2021-10-11 DIAGNOSIS — R739 Hyperglycemia, unspecified: Secondary | ICD-10-CM | POA: Diagnosis not present

## 2021-10-11 DIAGNOSIS — R7303 Prediabetes: Secondary | ICD-10-CM

## 2021-10-11 DIAGNOSIS — J189 Pneumonia, unspecified organism: Secondary | ICD-10-CM | POA: Diagnosis not present

## 2021-10-11 DIAGNOSIS — I350 Nonrheumatic aortic (valve) stenosis: Secondary | ICD-10-CM

## 2021-10-11 DIAGNOSIS — J9601 Acute respiratory failure with hypoxia: Secondary | ICD-10-CM | POA: Diagnosis not present

## 2021-10-11 DIAGNOSIS — J810 Acute pulmonary edema: Secondary | ICD-10-CM | POA: Diagnosis not present

## 2021-10-11 DIAGNOSIS — I214 Non-ST elevation (NSTEMI) myocardial infarction: Secondary | ICD-10-CM | POA: Diagnosis not present

## 2021-10-11 HISTORY — PX: RIGHT/LEFT HEART CATH AND CORONARY ANGIOGRAPHY: CATH118266

## 2021-10-11 LAB — BASIC METABOLIC PANEL
Anion gap: 10 (ref 5–15)
Anion gap: 10 (ref 5–15)
BUN: 32 mg/dL — ABNORMAL HIGH (ref 8–23)
BUN: 31 mg/dL — ABNORMAL HIGH (ref 8–23)
CO2: 21 mmol/L — ABNORMAL LOW (ref 22–32)
CO2: 26 mmol/L (ref 22–32)
Calcium: 8.6 mg/dL — ABNORMAL LOW (ref 8.9–10.3)
Calcium: 8.7 mg/dL — ABNORMAL LOW (ref 8.9–10.3)
Chloride: 107 mmol/L (ref 98–111)
Chloride: 102 mmol/L (ref 98–111)
Creatinine, Ser: 0.8 mg/dL (ref 0.61–1.24)
Creatinine, Ser: 0.95 mg/dL (ref 0.61–1.24)
GFR, Estimated: >60 mL/min (ref >60)
GFR, Estimated: >60 mL/min (ref >60)
Glucose, Bld: 109 mg/dL — ABNORMAL HIGH (ref 70–99)
Glucose, Bld: 156 mg/dL — ABNORMAL HIGH (ref 70–99)
Potassium: 3.4 mmol/L — ABNORMAL LOW (ref 3.5–5.1)
Potassium: 3.2 mmol/L — ABNORMAL LOW (ref 3.5–5.1)
Sodium: 138 mmol/L (ref 135–145)
Sodium: 138 mmol/L (ref 135–145)

## 2021-10-11 LAB — CBC
HCT: 37.8 % — ABNORMAL LOW (ref 39.0–52.0)
Hemoglobin: 12.7 g/dL — ABNORMAL LOW (ref 13.0–17.0)
MCH: 31.5 pg (ref 26.0–34.0)
MCHC: 33.6 g/dL (ref 30.0–36.0)
MCV: 93.8 fL (ref 80.0–100.0)
Platelets: 233 10*3/uL (ref 150–400)
RBC: 4.03 MIL/uL — ABNORMAL LOW (ref 4.22–5.81)
RDW: 14.3 % (ref 11.5–15.5)
WBC: 19.9 10*3/uL — ABNORMAL HIGH (ref 4.0–10.5)
nRBC: 0 % (ref 0.0–0.2)

## 2021-10-11 LAB — GLUCOSE, CAPILLARY
Glucose-Capillary: 102 mg/dL — ABNORMAL HIGH (ref 70–99)
Glucose-Capillary: 108 mg/dL — ABNORMAL HIGH (ref 70–99)
Glucose-Capillary: 136 mg/dL — ABNORMAL HIGH (ref 70–99)
Glucose-Capillary: 91 mg/dL (ref 70–99)
Glucose-Capillary: 95 mg/dL (ref 70–99)
Glucose-Capillary: 99 mg/dL (ref 70–99)

## 2021-10-11 LAB — POCT I-STAT EG7
Acid-Base Excess: 1 mmol/L (ref 0.0–2.0)
Acid-Base Excess: 1 mmol/L (ref 0.0–2.0)
Bicarbonate: 25.5 mmol/L (ref 20.0–28.0)
Bicarbonate: 25.7 mmol/L (ref 20.0–28.0)
Calcium, Ion: 1.16 mmol/L (ref 1.15–1.40)
Calcium, Ion: 1.16 mmol/L (ref 1.15–1.40)
HCT: 39 % (ref 39.0–52.0)
HCT: 39 % (ref 39.0–52.0)
Hemoglobin: 13.3 g/dL (ref 13.0–17.0)
Hemoglobin: 13.3 g/dL (ref 13.0–17.0)
O2 Saturation: 59 %
O2 Saturation: 62 %
Potassium: 3.5 mmol/L (ref 3.5–5.1)
Potassium: 3.5 mmol/L (ref 3.5–5.1)
Sodium: 140 mmol/L (ref 135–145)
Sodium: 140 mmol/L (ref 135–145)
TCO2: 27 mmol/L (ref 22–32)
TCO2: 27 mmol/L (ref 22–32)
pCO2, Ven: 39.8 mmHg — ABNORMAL LOW (ref 44.0–60.0)
pCO2, Ven: 39.9 mmHg — ABNORMAL LOW (ref 44.0–60.0)
pH, Ven: 7.415 (ref 7.250–7.430)
pH, Ven: 7.418 (ref 7.250–7.430)
pO2, Ven: 30 mmHg — CL (ref 32.0–45.0)
pO2, Ven: 32 mmHg (ref 32.0–45.0)

## 2021-10-11 LAB — PROCALCITONIN: Procalcitonin: 2.76 ng/mL

## 2021-10-11 LAB — POCT I-STAT 7, (LYTES, BLD GAS, ICA,H+H)
Acid-Base Excess: 1 mmol/L (ref 0.0–2.0)
Bicarbonate: 23.6 mmol/L (ref 20.0–28.0)
Calcium, Ion: 1.11 mmol/L — ABNORMAL LOW (ref 1.15–1.40)
HCT: 39 % (ref 39.0–52.0)
Hemoglobin: 13.3 g/dL (ref 13.0–17.0)
O2 Saturation: 100 %
Potassium: 3.4 mmol/L — ABNORMAL LOW (ref 3.5–5.1)
Sodium: 140 mmol/L (ref 135–145)
TCO2: 25 mmol/L (ref 22–32)
pCO2 arterial: 32.7 mmHg (ref 32.0–48.0)
pH, Arterial: 7.468 — ABNORMAL HIGH (ref 7.350–7.450)
pO2, Arterial: 199 mmHg — ABNORMAL HIGH (ref 83.0–108.0)

## 2021-10-11 LAB — PATHOLOGIST SMEAR REVIEW

## 2021-10-11 LAB — HEPARIN LEVEL (UNFRACTIONATED): Heparin Unfractionated: 0.18 IU/mL — ABNORMAL LOW (ref 0.30–0.70)

## 2021-10-11 LAB — HEMOGLOBIN A1C
Hgb A1c MFr Bld: 5.8 % — ABNORMAL HIGH (ref 4.8–5.6)
Mean Plasma Glucose: 119.76 mg/dL

## 2021-10-11 LAB — LIPID PANEL
Cholesterol: 128 mg/dL (ref 0–200)
HDL: 56 mg/dL (ref >40)
LDL Cholesterol: 55 mg/dL (ref 0–99)
Total CHOL/HDL Ratio: 2.3 RATIO
Triglycerides: 83 mg/dL (ref <150)
VLDL: 17 mg/dL (ref 0–40)

## 2021-10-11 LAB — TROPONIN I (HIGH SENSITIVITY): Troponin I (High Sensitivity): 7652 ng/L (ref <18)

## 2021-10-11 LAB — BRAIN NATRIURETIC PEPTIDE: B Natriuretic Peptide: 945.1 pg/mL — ABNORMAL HIGH (ref 0.0–100.0)

## 2021-10-11 SURGERY — RIGHT/LEFT HEART CATH AND CORONARY ANGIOGRAPHY

## 2021-10-11 MED ORDER — LIDOCAINE HCL (PF) 1 % IJ SOLN
INTRAMUSCULAR | Status: AC
  Filled 2021-10-11: qty 30

## 2021-10-11 MED ORDER — SODIUM CHLORIDE 0.9% FLUSH
3.0000 mL | Freq: Two times a day (BID) | INTRAVENOUS | Status: DC
  Administered 2021-10-11 – 2021-10-25 (×22): 3 mL via INTRAVENOUS

## 2021-10-11 MED ORDER — SODIUM CHLORIDE 0.9 % IV SOLN: 250.0000 mL | INTRAVENOUS | Status: DC | PRN

## 2021-10-11 MED ORDER — HEPARIN (PORCINE) 25000 UT/250ML-% IV SOLN
1350.0000 [IU]/h | INTRAVENOUS | Status: DC
  Administered 2021-10-11: 1250 [IU]/h via INTRAVENOUS
  Filled 2021-10-11: qty 250

## 2021-10-11 MED ORDER — SODIUM CHLORIDE 0.9% FLUSH: 3.0000 mL | INTRAVENOUS | Status: DC | PRN

## 2021-10-11 MED ORDER — SODIUM CHLORIDE 0.9 % WEIGHT BASED INFUSION: 3.0000 mL/kg/h | INTRAVENOUS | Status: DC

## 2021-10-11 MED ORDER — HEPARIN SODIUM (PORCINE) 1000 UNIT/ML IJ SOLN
INTRAMUSCULAR | Status: DC | PRN
  Administered 2021-10-11: 3000 [IU] via INTRAVENOUS

## 2021-10-11 MED ORDER — FUROSEMIDE 10 MG/ML IJ SOLN
40.0000 mg | Freq: Two times a day (BID) | INTRAMUSCULAR | Status: DC
  Administered 2021-10-11 – 2021-10-13 (×4): 40 mg via INTRAVENOUS
  Filled 2021-10-11 (×4): qty 4

## 2021-10-11 MED ORDER — CLOPIDOGREL BISULFATE 300 MG PO TABS
300.0000 mg | ORAL_TABLET | Freq: Once | ORAL | Status: AC
  Administered 2021-10-11: 300 mg via ORAL
  Filled 2021-10-11: qty 1

## 2021-10-11 MED ORDER — HEPARIN SODIUM (PORCINE) 1000 UNIT/ML IJ SOLN
INTRAMUSCULAR | Status: AC
  Filled 2021-10-11: qty 1

## 2021-10-11 MED ORDER — SODIUM CHLORIDE 0.9% FLUSH
3.0000 mL | Freq: Two times a day (BID) | INTRAVENOUS | Status: DC
  Administered 2021-10-11 – 2021-10-13 (×4): 3 mL via INTRAVENOUS

## 2021-10-11 MED ORDER — HEPARIN BOLUS VIA INFUSION
2100.0000 [IU] | Freq: Once | INTRAVENOUS | Status: AC
  Administered 2021-10-11: 2100 [IU] via INTRAVENOUS
  Filled 2021-10-11: qty 2100

## 2021-10-11 MED ORDER — FUROSEMIDE 10 MG/ML IJ SOLN
80.0000 mg | Freq: Once | INTRAMUSCULAR | Status: AC
  Administered 2021-10-11: 80 mg via INTRAVENOUS
  Filled 2021-10-11: qty 8

## 2021-10-11 MED ORDER — LIDOCAINE HCL (PF) 1 % IJ SOLN
INTRAMUSCULAR | Status: DC | PRN
  Administered 2021-10-11 (×2): 2 mL

## 2021-10-11 MED ORDER — VERAPAMIL HCL 2.5 MG/ML IV SOLN
INTRAVENOUS | Status: DC | PRN
  Administered 2021-10-11: 10 mL via INTRA_ARTERIAL

## 2021-10-11 MED ORDER — SODIUM CHLORIDE 0.9 % WEIGHT BASED INFUSION: 1.0000 mL/kg/h | INTRAVENOUS | Status: DC

## 2021-10-11 MED ORDER — HEPARIN (PORCINE) IN NACL 1000-0.9 UT/500ML-% IV SOLN
INTRAVENOUS | Status: AC
  Filled 2021-10-11: qty 1000

## 2021-10-11 MED ORDER — IOHEXOL 350 MG/ML SOLN
INTRAVENOUS | Status: DC | PRN
  Administered 2021-10-11: 60 mL via INTRA_ARTERIAL

## 2021-10-11 MED ORDER — HEPARIN (PORCINE) IN NACL 1000-0.9 UT/500ML-% IV SOLN
INTRAVENOUS | Status: DC | PRN
  Administered 2021-10-11 (×2): 500 mL

## 2021-10-11 MED ORDER — POTASSIUM CHLORIDE CRYS ER 20 MEQ PO TBCR
40.0000 meq | EXTENDED_RELEASE_TABLET | ORAL | Status: AC
Start: 2021-10-11 — End: 2021-10-11
  Administered 2021-10-11 (×2): 40 meq via ORAL
  Filled 2021-10-11 (×2): qty 2

## 2021-10-11 MED ORDER — VERAPAMIL HCL 2.5 MG/ML IV SOLN
INTRAVENOUS | Status: AC
  Filled 2021-10-11: qty 2

## 2021-10-11 MED ORDER — CLOPIDOGREL BISULFATE 75 MG PO TABS
75.0000 mg | ORAL_TABLET | Freq: Every day | ORAL | Status: DC
  Administered 2021-10-12 – 2021-10-20 (×8): 75 mg via ORAL
  Filled 2021-10-11 (×9): qty 1

## 2021-10-11 MED ORDER — MILRINONE LACTATE IN DEXTROSE 20-5 MG/100ML-% IV SOLN
0.2500 ug/kg/min | INTRAVENOUS | Status: DC
  Administered 2021-10-11 – 2021-10-12 (×2): 0.25 ug/kg/min via INTRAVENOUS
  Filled 2021-10-11 (×2): qty 100

## 2021-10-11 MED ORDER — SODIUM CHLORIDE 0.9 % IV SOLN
INTRAVENOUS | Status: DC | PRN
  Administered 2021-10-11: 10 mL/h via INTRAVENOUS

## 2021-10-11 MED ORDER — ASPIRIN 81 MG PO CHEW: 81.0000 mg | CHEWABLE_TABLET | ORAL | Status: DC

## 2021-10-11 SURGICAL SUPPLY — 14 items
CATH 5FR JL3.5 JR4 ANG PIG MP (CATHETERS) ×2 IMPLANT
CATH BALLN WEDGE 5F 110CM (CATHETERS) ×2 IMPLANT
DEVICE RAD TR BAND REGULAR (VASCULAR PRODUCTS) ×2 IMPLANT
ELECT DEFIB PAD ADLT CADENCE (PAD) ×2 IMPLANT
GLIDESHEATH SLEND SS 6F .021 (SHEATH) ×2 IMPLANT
GUIDEWIRE .025 260CM (WIRE) ×2 IMPLANT
GUIDEWIRE INQWIRE 1.5J.035X260 (WIRE) ×1 IMPLANT
INQWIRE 1.5J .035X260CM (WIRE) ×2
KIT HEART LEFT (KITS) ×2 IMPLANT
PACK CARDIAC CATHETERIZATION (CUSTOM PROCEDURE TRAY) ×2 IMPLANT
SHEATH GLIDE SLENDER 4/5FR (SHEATH) ×2 IMPLANT
SHEATH PROBE COVER 6X72 (BAG) ×2 IMPLANT
TRANSDUCER W/STOPCOCK (MISCELLANEOUS) ×2 IMPLANT
TUBING CIL FLEX 10 FLL-RA (TUBING) ×2 IMPLANT

## 2021-10-11 NOTE — Interval H&P Note (Signed)
History and Physical Interval Note:  10/11/2021 10:40 AM  Nicholas Finley  has presented today for surgery, with the diagnosis of shortness of breath.  The various methods of treatment have been discussed with the patient and family. After consideration of risks, benefits and other options for treatment, the patient has consented to  Procedure(s): RIGHT/LEFT HEART CATH AND CORONARY ANGIOGRAPHY (N/A) as a surgical intervention.  The patient's history has been reviewed, patient examined, no change in status, stable for surgery.  I have reviewed the patient's chart and labs.  Questions were answered to the patient's satisfaction.   Cath Lab Visit (complete for each Cath Lab visit)  Clinical Evaluation Leading to the Procedure:   ACS: Yes.    Non-ACS:    Anginal Classification: CCS IV  Anti-ischemic medical therapy: Maximal Therapy (2 or more classes of medications)  Non-Invasive Test Results: No non-invasive testing performed  Prior CABG: No previous CABG        Collier Salina [facility] 10/11/2021 10:40 AM

## 2021-10-11 NOTE — Progress Notes (Signed)
Pt refused to wear BiPAP stated he did not need it this moment and will call out if he is in need of it later.

## 2021-10-11 NOTE — Progress Notes (Signed)
Pt transported to cath lab and back to [place] [facility] on BIPAP. No complications noted.

## 2021-10-11 NOTE — Progress Notes (Signed)
67ml of air removed from Tr-band right radial,  w/small bleeding from puncture site noted, air returned w/hemostasis obtained.

## 2021-10-11 NOTE — Progress Notes (Signed)
[place] Progress Note Patient Name: Nicholas Finley DOB: 1926-08-21 MRN: [id number]   Date of Service  10/11/2021  HPI/Events of Note  Notified of patient request for diet order. Respiratory status is improving and patient has been on nasal cannula.   Pt is awake and alert on camera assessment.   eICU Interventions  Cardiac diet ordered.      Intervention Category Minor Interventions: Other:;Routine modifications to care plan (e.g. PRN medications for pain, fever)  Elsie Lincoln 10/11/2021, 6:12 AM

## 2021-10-11 NOTE — Progress Notes (Addendum)
Progress Note  Patient Name: Nicholas Finley Date of Encounter: 10/11/2021  [facility] Cardiologist: None   Subjective   Called urgently by CCM due to More CP with activity this AM.  Relieved with NTG. FAiled trial off of BiPap.  More Lasix given this AM.  Inpatient Medications    Scheduled Meds:  aspirin EC  81 mg Oral Daily   atorvastatin  40 mg Oral Daily   Chlorhexidine Gluconate Cloth  6 each Topical Daily   furosemide  80 mg Intravenous Once   insulin aspart  0-9 Units Subcutaneous Q4H   metoprolol tartrate  25 mg Oral BID   Continuous Infusions:  ceFEPime (MAXIPIME) IV Stopped (10/10/21 2250)   heparin 1,000 Units/hr (10/11/21 0600)   nitroGLYCERIN Stopped (10/09/21 2021)   PRN Meds: docusate sodium, nitroGLYCERIN, polyethylene glycol   Vital Signs    Vitals:   10/11/21 0806 10/11/21 0832 10/11/21 0900 10/11/21 0910  BP:   138/69 116/73  Pulse:  (!) 102 90 88  Resp:  13 17 19   Temp:      TempSrc:      SpO2: 96% 96% 98% 94%  Weight:        Intake/Output Summary (Last 24 hours) at 10/11/2021 0913 Last data filed at 10/11/2021 0657 Gross per 24 hour  Intake 409.81 ml  Output 1525 ml  Net -1115.19 ml   Last 3 Weights 10/09/2021 09/07/2021 03/09/2021  Weight (lbs) 149 lb 149 lb 148 lb  Weight (kg) 67.586 kg 67.586 kg 67.132 kg      Telemetry     NSR- Personally Reviewed  ECG    NSR, anterior T wave inversion - Personally Reviewed  Physical Exam   GEN: No acute distress.  On BiPap- comfortable. Neck: No JVD Cardiac: RRR, no murmurs, rubs, or gallops.  Respiratory: Coarse breath sounds to auscultation bilaterally. GI: Soft, nontender, non-distended  MS: No edema; No deformity. Neuro:  Nonfocal  Psych: Normal affect   Labs    High Sensitivity Troponin:   Recent Labs  Lab 10/09/21 1710 10/09/21 1921 10/10/21 0500  TROPONINIHS 155* 1,071* 20,610*     Chemistry Recent Labs  Lab 10/09/21 1710 10/09/21 1720 10/10/21 0130  10/10/21 0408 10/10/21 1412 10/11/21 0601  NA 139   < > 136 140 141 138  K 4.8   < > 4.1 3.8 3.4* 3.4*  CL 107  --  108  --  108 107  CO2 15*  --  18*  --  21* 21*  GLUCOSE 295*  --  153*  --  120* 109*  BUN 23  --  28*  --  28* 32*  CREATININE 1.47*  --  0.93  --  0.83 0.80  CALCIUM 8.8*  --  8.3*  --  8.6* 8.6*  MG  --   --  2.2  --   --   --   PROT 6.9  --   --   --   --   --   ALBUMIN 3.3*  --   --   --   --   --   AST 114*  --   --   --   --   --   ALT 66*  --   --   --   --   --   ALKPHOS 70  --   --   --   --   --   BILITOT 0.9  --   --   --   --   --  GFRNONAA 44*  --  >60  --  >60 >60  ANIONGAP 17*  --  10  --  12 10   < > = values in this interval not displayed.    Lipids No results for input(s): CHOL, TRIG, HDL, LABVLDL, LDLCALC, CHOLHDL in the last 168 hours.  Hematology Recent Labs  Lab 10/09/21 1710 10/09/21 1720 10/10/21 0130 10/10/21 0408 10/11/21 0601  WBC 21.1*  --  29.5*  --  19.9*  RBC 4.47  --  4.53  --  4.03*  HGB 14.0   < > 14.2 13.6 12.7*  HCT 46.0   < > 43.4 40.0 37.8*  MCV 102.9*  --  95.8  --  93.8  MCH 31.3  --  31.3  --  31.5  MCHC 30.4  --  32.7  --  33.6  RDW 14.1  --  14.1  --  14.3  PLT 318  --  265  --  233   < > = values in this interval not displayed.   Thyroid No results for input(s): TSH, FREET4 in the last 168 hours.  BNP Recent Labs  Lab 10/09/21 1710 10/10/21 1410 10/11/21 0601  BNP 625.3* 1,802.6* 945.1*    DDimer No results for input(s): DDIMER in the last 168 hours.   Radiology    DG Chest Port 1 View  Result Date: 10/11/2021 CLINICAL DATA:  Pulmonary edema, confusion and shortness of breath this morning EXAM: PORTABLE CHEST 1 VIEW COMPARISON:  Chest radiograph dated 1 day prior FINDINGS: The heart is enlarged, unchanged. The mediastinal contours are stable. Again seen are patchy perihilar opacities in both lungs likely reflecting moderate pulmonary interstitial edema. Aeration of the left base has worsened with  slight interval enlargement of a left pleural effusion. There is probably a trace right pleural effusion. There is no pneumothorax. There is no acute osseous abnormality. IMPRESSION: Worsening aeration of the left base with slight interval enlargement of a small left pleural effusion. Perihilar opacities are not significantly changed. Findings again likely reflect moderate pulmonary interstitial edema, though superimposed infection cannot be excluded. Electronically Signed   By: Valetta Mole M.D.   On: 10/11/2021 08:32   DG Chest Port 1 View  Result Date: 10/10/2021 CLINICAL DATA:  Shortness of breath EXAM: PORTABLE CHEST 1 VIEW COMPARISON:  Chest radiograph 1 day prior FINDINGS: The cardiomediastinal silhouette is stable, with unchanged calcified atherosclerotic plaque of the aortic arch. Patchy airspace disease is again seen throughout both lungs. Opacity in the medial right base has worsened since the study from 1 day prior. Aeration of the lungs is otherwise not significantly changed. There is no significant pleural effusion. There is no pneumothorax. The bones are stable. IMPRESSION: Worsened aeration in the medial right base since the study from 1 day prior. Patchy airspace disease throughout the remainder of the lungs is otherwise not significantly changed. Findings may reflect infection or pulmonary edema. Electronically Signed   By: Valetta Mole M.D.   On: 10/10/2021 08:24   DG Chest Port 1 View  Result Date: 10/09/2021 CLINICAL DATA:  Shortness of breath, respiratory distress EXAM: PORTABLE CHEST 1 VIEW COMPARISON:  05/13/2014 FINDINGS: Airspace disease throughout both lungs, most pronounced in the upper lobes bilaterally. Heart is normal size. No effusions or acute bony abnormality. IMPRESSION: Bilateral airspace disease, most pronounced in the upper lobes. Favor infection. Electronically Signed   By: Rolm Baptise M.D.   On: 10/09/2021 17:17   ECHOCARDIOGRAM COMPLETE  Result Date:  10/10/2021  ECHOCARDIOGRAM REPORT   Patient Name:   Nicholas Finley Date of Exam: 10/10/2021 Medical Rec #:  [id number]         Height:       65.5 in Accession #:    [id number]        Weight:       149.0 lb Date of Birth:  1926-06-10         BSA:          1.755 m Patient Age:    87 years          BP:           129/69 mmHg Patient Gender: M                 HR:           75 bpm. Exam Location:  Inpatient Procedure: 2D Echo, Cardiac Doppler, Color Doppler and Intracardiac            Opacification Agent Indications:    NSTEMI  History:        Patient has no prior history of Echocardiogram examinations.                 Signs/Symptoms:Cerebellar hemmorhage; Risk Factors:Hypertension.  Sonographer:    Merrie Roof RDCS Referring Phys: [id number] [place]  1. Left ventricular ejection fraction, by estimation, is 40 to 45%. The left ventricle has mildly decreased function. The left ventricle demonstrates regional wall motion abnormalities (see scoring diagram/findings for description). Left ventricular diastolic parameters are consistent with Grade I diastolic dysfunction (impaired relaxation).  2. Right ventricular systolic function is normal. The right ventricular size is normal.  3. The mitral valve is normal in structure. No evidence of mitral valve regurgitation. No evidence of mitral stenosis.  4. The aortic valve is calcified. There is moderate calcification of the aortic valve. There is moderate thickening of the aortic valve. Aortic valve regurgitation is moderate. Moderate aortic valve stenosis. Aortic valve mean gradient measures 25.2 mmHg. Aortic valve Vmax measures 3.16 m/s.  5. The inferior vena cava is dilated in size with <50% respiratory variability, suggesting right atrial pressure of 15 mmHg. FINDINGS  Left Ventricle: Left ventricular ejection fraction, by estimation, is 40 to 45%. The left ventricle has mildly decreased function. The left ventricle demonstrates regional wall motion  abnormalities. Definity contrast agent was given IV to delineate the left ventricular endocardial borders. The left ventricular internal cavity size was normal in size. There is no left ventricular hypertrophy. Left ventricular diastolic parameters are consistent with Grade I diastolic dysfunction (impaired relaxation).  LV Wall Scoring: The apical lateral segment, mid inferoseptal segment, apical septal segment, and apex are akinetic. Right Ventricle: The right ventricular size is normal. No increase in right ventricular wall thickness. Right ventricular systolic function is normal. Left Atrium: Left atrial size was normal in size. Right Atrium: Right atrial size was normal in size. Pericardium: There is no evidence of pericardial effusion. Mitral Valve: The mitral valve is normal in structure. No evidence of mitral valve regurgitation. No evidence of mitral valve stenosis. Tricuspid Valve: The tricuspid valve is normal in structure. Tricuspid valve regurgitation is not demonstrated. No evidence of tricuspid stenosis. Aortic Valve: The aortic valve is calcified. There is moderate calcification of the aortic valve. There is moderate thickening of the aortic valve. Aortic valve regurgitation is moderate. Moderate aortic stenosis is present. Aortic valve mean gradient measures 25.2 mmHg. Aortic valve peak gradient measures 40.1 mmHg. Aortic valve area,  by VTI measures 0.67 cm. Pulmonic Valve: The pulmonic valve was normal in structure. Pulmonic valve regurgitation is not visualized. No evidence of pulmonic stenosis. Aorta: The aortic root is normal in size and structure. Venous: The inferior vena cava is dilated in size with less than 50% respiratory variability, suggesting right atrial pressure of 15 mmHg. IAS/Shunts: No atrial level shunt detected by color flow Doppler.  LEFT VENTRICLE PLAX 2D LVIDd:         6.70 cm   Diastology LVIDs:         4.20 cm   LV e' medial:   3.26 cm/s LV PW:         1.10 cm   LV E/e'  medial: 14.7 LV IVS:        1.00 cm LVOT diam:     2.20 cm LV SV:         48 LV SV Index:   27 LVOT Area:     3.80 cm  RIGHT VENTRICLE             IVC RV Basal diam:  2.80 cm     IVC diam: 2.20 cm RV S prime:     11.40 cm/s TAPSE (M-mode): 2.0 cm LEFT ATRIUM           Index        RIGHT ATRIUM           Index LA diam:      3.80 cm 2.16 cm/m   RA Area:     13.50 cm LA Vol (A2C): 43.6 ml 24.84 ml/m  RA Volume:   33.80 ml  19.25 ml/m LA Vol (A4C): 58.1 ml 33.10 ml/m  AORTIC VALVE AV Area (Vmax):    0.79 cm AV Area (Vmean):   0.68 cm AV Area (VTI):     0.67 cm AV Vmax:           316.50 cm/s AV Vmean:          241.000 cm/s AV VTI:            0.712 m AV Peak Grad:      40.1 mmHg AV Mean Grad:      25.2 mmHg LVOT Vmax:         65.70 cm/s LVOT Vmean:        42.900 cm/s LVOT VTI:          0.125 m LVOT/AV VTI ratio: 0.18  AORTA Ao Root diam: 3.10 cm MITRAL VALVE MV Area (PHT): 3.60 cm     SHUNTS MV Decel Time: 211 msec     Systemic VTI:  0.12 m MV E velocity: 48.00 cm/s   Systemic Diam: 2.20 cm MV A velocity: 109.00 cm/s MV E/A ratio:  0.44 Candee Furbish MD Electronically signed by Candee Furbish MD Signature Date/Time: 10/10/2021/1:27:53 PM    Final    VAS [place] LOWER EXTREMITY VENOUS (DVT)  Result Date: 10/10/2021  Lower Venous DVT Study Patient Name:  UMER HARIG  Date of Exam:   10/10/2021 Medical Rec #: [id number]          Accession #:    [id number] Date of Birth: October 14, 1926          Patient Gender: M Patient Age:   72 years Exam Location:  [facility] Procedure:      VAS [place] LOWER EXTREMITY VENOUS (DVT) Referring Phys: Elease Etienne --------------------------------------------------------------------------------  Indications: Pain.  Comparison Study: no prior Performing Technologist: Archie Patten RVS  Examination Guidelines:  A complete evaluation includes B-mode imaging, spectral Doppler, color Doppler, and power Doppler as needed of all accessible portions of each vessel. Bilateral testing is  considered an integral part of a complete examination. Limited examinations for reoccurring indications may be performed as noted. The reflux portion of the exam is performed with the patient in reverse Trendelenburg.  +---------+---------------+---------+-----------+----------+--------------+ RIGHT    CompressibilityPhasicitySpontaneityPropertiesThrombus Aging +---------+---------------+---------+-----------+----------+--------------+ CFV      Full           Yes      Yes                                 +---------+---------------+---------+-----------+----------+--------------+ SFJ      Full                                                        +---------+---------------+---------+-----------+----------+--------------+ FV Prox  Full                                                        +---------+---------------+---------+-----------+----------+--------------+ FV Mid   Full                                                        +---------+---------------+---------+-----------+----------+--------------+ FV DistalFull                                                        +---------+---------------+---------+-----------+----------+--------------+ PFV      Full                                                        +---------+---------------+---------+-----------+----------+--------------+ POP      Full           Yes      Yes                                 +---------+---------------+---------+-----------+----------+--------------+ PTV      Full                                                        +---------+---------------+---------+-----------+----------+--------------+ PERO     Full                                                        +---------+---------------+---------+-----------+----------+--------------+   +---------+---------------+---------+-----------+----------+--------------+  LEFT      CompressibilityPhasicitySpontaneityPropertiesThrombus Aging +---------+---------------+---------+-----------+----------+--------------+ CFV      Full           Yes      Yes                                 +---------+---------------+---------+-----------+----------+--------------+ SFJ      Full                                                        +---------+---------------+---------+-----------+----------+--------------+ FV Prox  Full                                                        +---------+---------------+---------+-----------+----------+--------------+ FV Mid   Full                                                        +---------+---------------+---------+-----------+----------+--------------+ FV DistalFull                                                        +---------+---------------+---------+-----------+----------+--------------+ PFV      Full                                                        +---------+---------------+---------+-----------+----------+--------------+ POP      Full           Yes      Yes                                 +---------+---------------+---------+-----------+----------+--------------+ PTV      Full                                                        +---------+---------------+---------+-----------+----------+--------------+ PERO     Full                                                        +---------+---------------+---------+-----------+----------+--------------+     Summary: BILATERAL: - No evidence of deep vein thrombosis seen in the lower extremities, bilaterally. -No evidence of popliteal cyst, bilaterally.   *See table(s) above for measurements and observations. Electronically signed by Servando Snare MD on 10/10/2021 at 4:34:52 PM.  Final     Cardiac Studies   Echo with EF 40%, moderate AS  Patient Profile     87 y.o. male with NSTEMI, AS, hypoxic resp failure  Assessment & Plan     NSTEMI: Troponin significantly elevated.  LVEF decreased.  REcurrent angina with just moving in bed.  Despite age, he is quite active. Discussed with CCM.  THey feel that his sx are more cardiac than related to pneumonia.  Will plan for R/L cath later today. Renal function stable. Of note, he lives independently.  He drives and takes care of himself.  AS: moderate aortic stenosis by echo.  Will reassess by cath.    Pneumonia: being treated with Antibiotics.  Lasix for pulm edema.   He has no children.  He has an elderly cousin who cannot visit.  His cousin's daughter has visited.    The patient understands that risks include but are not limited to stroke (1 in 1000), death (1 in 26), kidney failure [usually temporary] (1 in 500), bleeding (1 in 200), allergic reaction [possibly serious] (1 in 200), and agrees to proceed.    CRITICAL CARE Performed by: Larae Grooms   Total critical care time: 40 minutes  Critical care time was exclusive of separately billable procedures and treating other patients.  Critical care was necessary to treat or prevent imminent or life-threatening deterioration.  Critical care was time spent personally by me on the following activities: development of treatment plan with patient and/or surrogate as well as nursing, discussions with consultants, evaluation of patient's response to treatment, examination of patient, obtaining history from patient or surrogate, ordering and performing treatments and interventions, ordering and review of laboratory studies, ordering and review of radiographic studies, pulse oximetry and re-evaluation of patient's condition.        For questions or updates, please contact [place] Please consult [email or web address] for contact info under        Signed, Larae Grooms, MD  10/11/2021, 9:13 AM

## 2021-10-11 NOTE — Progress Notes (Signed)
NAME:  Kebin Maye, MRN:  [id number], DOB:  02/10/1926, LOS: 2 ADMISSION DATE:  10/09/2021, CONSULTATION DATE:  10/11/21 REFERRING MD:  Maryan Rued, CHIEF COMPLAINT:  Shortness of breath   History of Present Illness:  Mr. Szatkowski is a 87 y.o. M with PMH of HTN, HL, who presented with worsening chest pain and shortness of breath over the last two days.  He reports gradually worsening dyspnea over several weeks, but acutely worse day of admit.  On EMS arrival, pt apparently had oxygen saturations in the 60%'s and EKG with concern for ST depressions in inferior and lateral leads.      Pertinent  Medical History   has a past medical history of Allergy, Cataract, Chest pain (10/09/2021), Enlarged prostate, Heart murmur, Hypercholesteremia, Hypertension, and Sepsis with acute hypoxic respiratory failure and septic shock ([place]) (10/09/2021). Occasional smoker. No known h/o COPD  Significant Hospital Events: Including procedures, antibiotic start and stop dates in addition to other pertinent events   10/16 presented with CP/dyspnea: likely NSTEMI, On arrival to the ED, patient was placed on Bipap with improvement.  He was started on a Nitro gtt for chest pain and HTN.  CXR with bilateral airspace disease and labs significant for WBC of 21k, troponin 155->1071, lactic acid 8.5.  He was seen by cardiology and started on Asa and heparin gtt.  Vanco and cefepime started empirically.  BNP 625. NTG stopped d/t BP 9/17 respiratory viral panel negative. IV lasix given  9/18: Chest pain had changed over the course of the evening to just with exertion.  When BiPAP was removed during a.m. hours had fairly acute chest discomfort.  Plan of care discussed with cardiology, he was given sublingual nitroglycerin, IV Lasix, placed back on BiPAP, and then plans made for cardiac catheterization  Interim History / Subjective:  Still having intermittent exertional chest pain.  Had chest pain which was quite significant  after BiPAP removed for breakfast  Objective   Blood pressure 116/73, pulse 88, temperature (Abnormal) 97 F (36.1 C), temperature source Axillary, resp. rate 19, weight 67.6 kg, SpO2 94 %.    Vent Mode: BIPAP;PCV FiO2 (%):  [50 %] 50 % Set Rate:  [10 bmp] 10 bmp Pressure Support:  [6 cmH20] 6 cmH20   Intake/Output Summary (Last 24 hours) at 10/11/2021 0919 Last data filed at 10/11/2021 0657 Gross per 24 hour  Intake 409.81 ml  Output 1525 ml  Net -1115.19 ml   Filed Weights   10/09/21 2105  Weight: 67.6 kg   General: Resting in bed he is in no acute distress currently, but back on BiPAP HEENT normocephalic atraumatic no jugular venous distention Pulmonary: Diminished bases no accessory use while on BiPAP. Cardiac: Regular rate and rhythm Abdomen: Soft nontender Extremities: Warm dry Neuro: Awake oriented no focal deficits GU: Clear yellow.   Resolved Hospital Problem list   Lactic acidosis cleared, this was secondary to work of breathing and hypoxia AKI resolved Assessment & Plan:   Acute Hypoxic Respiratory Failure secondary to diffuse pulmonary edema, +/- PNA;  Portable chest x-ray personally reviewed this continues to demonstrate diffuse pulmonary edema Plan Continue supplemental oxygen and BiPAP support as needed IV Lasix Day #3 of 5 cefepime Proceed with ischemia evaluation as pulmonary edema driving factor here  NSTEMI/Demand Ischemia, with acute systolic heart failure Aortic stenosis -echo, cardiology noted global LV dysfunction on POCUS Plan Continuing IV heparin Sublingual nitro Low-dose Lopressor statin Proceeding with cardiac catheterization hopefully later today per my discussion with cardiology team  Leukocytosis  -aspiration vs reactive Plan Continue to monitor   Fluid and electrolyte imbalance: Hypokalemia Plan Reports significant  Hyperglycemia Denies DM at baseline, hemoglobin A1c normal Plan Ssi    Elevated  Transaminsases Mild, Likely secondary hepatic congestion from heart failure Plan Am LFTs  Best Practice (right click and "Reselect all SmartList Selections" daily)   Diet/type: NPO w/ oral meds DVT prophylaxis: systemic heparin GI prophylaxis: N/A Lines: N/A Foley:  N/A Code Status:  full code Last date of multidisciplinary goals of care discussion [pending]  My cct 32 min   Erick Colace ACNP-BC [place] Pager # [phone] OR # [phone] if no answer

## 2021-10-11 NOTE — Progress Notes (Signed)
Pt removed from BIPAP and placed on 6L [place] due to pt wanting to eat breakfast. RT to continue to monitor.

## 2021-10-11 NOTE — Progress Notes (Signed)
[place] for heparin Indication: chest pain/ACS  No Known Allergies  Patient Measurements: Weight: 67.6 kg (149 lb) Heparin Dosing Weight: 70kg  Vital Signs: Temp: 97 F (36.1 C) (10/18 0749) Temp Source: Axillary (10/18 0749) BP: 126/69 (10/18 0615) Pulse Rate: 102 (10/18 0832)  Labs: Recent Labs    10/09/21 1710 10/09/21 1720 10/09/21 1921 10/10/21 0130 10/10/21 0408 10/10/21 0500 10/10/21 1051 10/10/21 1412 10/11/21 0601  HGB 14.0   < >  --  14.2 13.6  --   --   --  12.7*  HCT 46.0   < >  --  43.4 40.0  --   --   --  37.8*  PLT 318  --   --  265  --   --   --   --  233  HEPARINUNFRC  --   --   --  <0.10*  --   --  0.31  --  0.18*  CREATININE 1.47*  --   --  0.93  --   --   --  0.83 0.80  TROPONINIHS 155*  --  1,071*  --   --  20,610*  --   --   --    < > = values in this interval not displayed.     Estimated Creatinine Clearance: 51.2 mL/min (by C-G formula based on SCr of 0.8 mg/dL).   Medical History: Past Medical History:  Diagnosis Date   Allergy    Cataract    Chest pain 10/09/2021   Enlarged prostate    Heart murmur    Hypercholesteremia    Hypertension    Sepsis with acute hypoxic respiratory failure and septic shock ([place]) 10/09/2021    Assessment: 89 YOM presenting with respiratory distress, elevated troponin, he is not on anticoagulation PTA.  CBC wnl. Heparin started for ACS.  Heparin level subtherapeutic at 0.18 on 1000 units/hr. Hemoglobin dropped 12.6 to 12.7 and platelets stable. Patient had an episode of hematuria yesterday afternoon, however, examined foley with RN today and no blood noted. Will continue to monitor for bleeding.   Planning for cath lab this afternoon.  Goal of Therapy:  Heparin level 0.3-0.7 units/ml Monitor platelets by anticoagulation protocol: Yes   Plan:  [place] 2,100 units heparin Increase rate to 1250 units/hr Recheck heparin level in 6 hours  Cathrine Muster,  PharmD PGY2 Cardiology Pharmacy Resident Phone: [phone] 10/11/2021  9:08 AM  Please check [email or web address] for unit-specific pharmacy phone numbers.  Please check AMION for all [place] numbers 10/11/2021

## 2021-10-11 NOTE — Progress Notes (Signed)
ANTICOAGULATION CONSULT NOTE  Pharmacy Consult for heparin Indication: chest pain/ACS  No Known Allergies  Patient Measurements: Weight: 67.6 kg (149 lb) Heparin Dosing Weight: 70kg  Vital Signs: Temp: 97 F (36.1 C) (10/18 0749) Temp Source: Axillary (10/18 0749) BP: 130/67 (10/18 1415) Pulse Rate: 79 (10/18 1415)  Labs: Recent Labs    10/09/21 1710 10/09/21 1720 10/09/21 1921 10/10/21 0130 10/10/21 0408 10/10/21 0500 10/10/21 1051 10/10/21 1412 10/11/21 0601 10/11/21 1112 10/11/21 1115 10/11/21 1116  HGB 14.0   < >  --  14.2   < >  --   --   --  12.7* 13.3 13.3 13.3  HCT 46.0   < >  --  43.4   < >  --   --   --  37.8* 39.0 39.0 39.0  PLT 318  --   --  265  --   --   --   --  233  --   --   --   HEPARINUNFRC  --   --   --  <0.10*  --   --  0.31  --  0.18*  --   --   --   CREATININE 1.47*  --   --  0.93  --   --   --  0.83 0.80  --   --   --   TROPONINIHS 155*  --  1,071*  --   --  20,610*  --   --  7,652*  --   --   --    < > = values in this interval not displayed.     Estimated Creatinine Clearance: 51.2 mL/min (by C-G formula based on SCr of 0.8 mg/dL).   Medical History: Past Medical History:  Diagnosis Date   Allergy    Cataract    Chest pain 10/09/2021   Enlarged prostate    Heart murmur    Hypercholesteremia    Hypertension    Sepsis with acute hypoxic respiratory failure and septic shock ([place]) 10/09/2021    Assessment: 89 YOM presenting with respiratory distress, elevated troponin, he is not on anticoagulation PTA.  CBC wnl. Heparin started for ACS.  Went for re-look cath today. No intervention done, but may go back for a high risk PCI later in the week. Post-cath will restart heparin at prior rate and re-check levels.   Hemoglobin dropped 12.6 to 12.7 and platelets stable. Patient had an episode of hematuria yesterday afternoon, however, examined foley with RN today and no blood noted. Will continue to monitor for bleeding.    Goal of  Therapy:  Heparin level 0.3-0.7 units/ml Monitor platelets by anticoagulation protocol: Yes   Plan:  Restart heparin at 1250 units/hr at [place] heparin level in 6 hours  Cathrine Muster, PharmD PGY2 Cardiology Pharmacy Resident Phone: [phone] 10/11/2021  3:10 PM  Please check [email or web address] for unit-specific pharmacy phone numbers.  Please check AMION for all [place] numbers 10/11/2021

## 2021-10-11 NOTE — Progress Notes (Signed)
Chestpain relief after NTG.

## 2021-10-11 NOTE — Progress Notes (Signed)
Stating "something is wrong, I think it's my heart". 79min after bipap removed. Per shift report patient had been off bipap all night until 0600 this am, when he experienced SOB, labored breathing. Now patient c/o chest pressure. EKG obtained and Dr Carlis Abbott notified who is arriving at bedside. Orders for NTG obtained.

## 2021-10-11 NOTE — H&P (View-Only) (Signed)
Progress Note  Patient Name: Nicholas Finley Date of Encounter: 10/11/2021  [facility] Cardiologist: None   Subjective   Called urgently by CCM due to More CP with activity this AM.  Relieved with NTG. FAiled trial off of BiPap.  More Lasix given this AM.  Inpatient Medications    Scheduled Meds:  aspirin EC  81 mg Oral Daily   atorvastatin  40 mg Oral Daily   Chlorhexidine Gluconate Cloth  6 each Topical Daily   furosemide  80 mg Intravenous Once   insulin aspart  0-9 Units Subcutaneous Q4H   metoprolol tartrate  25 mg Oral BID   Continuous Infusions:  ceFEPime (MAXIPIME) IV Stopped (10/10/21 2250)   heparin 1,000 Units/hr (10/11/21 0600)   nitroGLYCERIN Stopped (10/09/21 2021)   PRN Meds: docusate sodium, nitroGLYCERIN, polyethylene glycol   Vital Signs    Vitals:   10/11/21 0806 10/11/21 0832 10/11/21 0900 10/11/21 0910  BP:   138/69 116/73  Pulse:  (!) 102 90 88  Resp:  13 17 19   Temp:      TempSrc:      SpO2: 96% 96% 98% 94%  Weight:        Intake/Output Summary (Last 24 hours) at 10/11/2021 0913 Last data filed at 10/11/2021 0657 Gross per 24 hour  Intake 409.81 ml  Output 1525 ml  Net -1115.19 ml   Last 3 Weights 10/09/2021 09/07/2021 03/09/2021  Weight (lbs) 149 lb 149 lb 148 lb  Weight (kg) 67.586 kg 67.586 kg 67.132 kg      Telemetry     NSR- Personally Reviewed  ECG    NSR, anterior T wave inversion - Personally Reviewed  Physical Exam   GEN: No acute distress.  On BiPap- comfortable. Neck: No JVD Cardiac: RRR, no murmurs, rubs, or gallops.  Respiratory: Coarse breath sounds to auscultation bilaterally. GI: Soft, nontender, non-distended  MS: No edema; No deformity. Neuro:  Nonfocal  Psych: Normal affect   Labs    High Sensitivity Troponin:   Recent Labs  Lab 10/09/21 1710 10/09/21 1921 10/10/21 0500  TROPONINIHS 155* 1,071* 20,610*     Chemistry Recent Labs  Lab 10/09/21 1710 10/09/21 1720 10/10/21 0130  10/10/21 0408 10/10/21 1412 10/11/21 0601  NA 139   < > 136 140 141 138  K 4.8   < > 4.1 3.8 3.4* 3.4*  CL 107  --  108  --  108 107  CO2 15*  --  18*  --  21* 21*  GLUCOSE 295*  --  153*  --  120* 109*  BUN 23  --  28*  --  28* 32*  CREATININE 1.47*  --  0.93  --  0.83 0.80  CALCIUM 8.8*  --  8.3*  --  8.6* 8.6*  MG  --   --  2.2  --   --   --   PROT 6.9  --   --   --   --   --   ALBUMIN 3.3*  --   --   --   --   --   AST 114*  --   --   --   --   --   ALT 66*  --   --   --   --   --   ALKPHOS 70  --   --   --   --   --   BILITOT 0.9  --   --   --   --   --  GFRNONAA 44*  --  >60  --  >60 >60  ANIONGAP 17*  --  10  --  12 10   < > = values in this interval not displayed.    Lipids No results for input(s): CHOL, TRIG, HDL, LABVLDL, LDLCALC, CHOLHDL in the last 168 hours.  Hematology Recent Labs  Lab 10/09/21 1710 10/09/21 1720 10/10/21 0130 10/10/21 0408 10/11/21 0601  WBC 21.1*  --  29.5*  --  19.9*  RBC 4.47  --  4.53  --  4.03*  HGB 14.0   < > 14.2 13.6 12.7*  HCT 46.0   < > 43.4 40.0 37.8*  MCV 102.9*  --  95.8  --  93.8  MCH 31.3  --  31.3  --  31.5  MCHC 30.4  --  32.7  --  33.6  RDW 14.1  --  14.1  --  14.3  PLT 318  --  265  --  233   < > = values in this interval not displayed.   Thyroid No results for input(s): TSH, FREET4 in the last 168 hours.  BNP Recent Labs  Lab 10/09/21 1710 10/10/21 1410 10/11/21 0601  BNP 625.3* 1,802.6* 945.1*    DDimer No results for input(s): DDIMER in the last 168 hours.   Radiology    DG Chest Port 1 View  Result Date: 10/11/2021 CLINICAL DATA:  Pulmonary edema, confusion and shortness of breath this morning EXAM: PORTABLE CHEST 1 VIEW COMPARISON:  Chest radiograph dated 1 day prior FINDINGS: The heart is enlarged, unchanged. The mediastinal contours are stable. Again seen are patchy perihilar opacities in both lungs likely reflecting moderate pulmonary interstitial edema. Aeration of the left base has worsened with  slight interval enlargement of a left pleural effusion. There is probably a trace right pleural effusion. There is no pneumothorax. There is no acute osseous abnormality. IMPRESSION: Worsening aeration of the left base with slight interval enlargement of a small left pleural effusion. Perihilar opacities are not significantly changed. Findings again likely reflect moderate pulmonary interstitial edema, though superimposed infection cannot be excluded. Electronically Signed   By: Valetta Mole M.D.   On: 10/11/2021 08:32   DG Chest Port 1 View  Result Date: 10/10/2021 CLINICAL DATA:  Shortness of breath EXAM: PORTABLE CHEST 1 VIEW COMPARISON:  Chest radiograph 1 day prior FINDINGS: The cardiomediastinal silhouette is stable, with unchanged calcified atherosclerotic plaque of the aortic arch. Patchy airspace disease is again seen throughout both lungs. Opacity in the medial right base has worsened since the study from 1 day prior. Aeration of the lungs is otherwise not significantly changed. There is no significant pleural effusion. There is no pneumothorax. The bones are stable. IMPRESSION: Worsened aeration in the medial right base since the study from 1 day prior. Patchy airspace disease throughout the remainder of the lungs is otherwise not significantly changed. Findings may reflect infection or pulmonary edema. Electronically Signed   By: Valetta Mole M.D.   On: 10/10/2021 08:24   DG Chest Port 1 View  Result Date: 10/09/2021 CLINICAL DATA:  Shortness of breath, respiratory distress EXAM: PORTABLE CHEST 1 VIEW COMPARISON:  05/13/2014 FINDINGS: Airspace disease throughout both lungs, most pronounced in the upper lobes bilaterally. Heart is normal size. No effusions or acute bony abnormality. IMPRESSION: Bilateral airspace disease, most pronounced in the upper lobes. Favor infection. Electronically Signed   By: Rolm Baptise M.D.   On: 10/09/2021 17:17   ECHOCARDIOGRAM COMPLETE  Result Date:  10/10/2021  ECHOCARDIOGRAM REPORT   Patient Name:   Nicholas Finley Date of Exam: 10/10/2021 Medical Rec #:  [id number]         Height:       65.5 in Accession #:    [id number]        Weight:       149.0 lb Date of Birth:  08-08-1926         BSA:          1.755 m Patient Age:    36 years          BP:           129/69 mmHg Patient Gender: M                 HR:           75 bpm. Exam Location:  Inpatient Procedure: 2D Echo, Cardiac Doppler, Color Doppler and Intracardiac            Opacification Agent Indications:    NSTEMI  History:        Patient has no prior history of Echocardiogram examinations.                 Signs/Symptoms:Cerebellar hemmorhage; Risk Factors:Hypertension.  Sonographer:    Merrie Roof RDCS Referring Phys: [id number] [place]  1. Left ventricular ejection fraction, by estimation, is 40 to 45%. The left ventricle has mildly decreased function. The left ventricle demonstrates regional wall motion abnormalities (see scoring diagram/findings for description). Left ventricular diastolic parameters are consistent with Grade I diastolic dysfunction (impaired relaxation).  2. Right ventricular systolic function is normal. The right ventricular size is normal.  3. The mitral valve is normal in structure. No evidence of mitral valve regurgitation. No evidence of mitral stenosis.  4. The aortic valve is calcified. There is moderate calcification of the aortic valve. There is moderate thickening of the aortic valve. Aortic valve regurgitation is moderate. Moderate aortic valve stenosis. Aortic valve mean gradient measures 25.2 mmHg. Aortic valve Vmax measures 3.16 m/s.  5. The inferior vena cava is dilated in size with <50% respiratory variability, suggesting right atrial pressure of 15 mmHg. FINDINGS  Left Ventricle: Left ventricular ejection fraction, by estimation, is 40 to 45%. The left ventricle has mildly decreased function. The left ventricle demonstrates regional wall motion  abnormalities. Definity contrast agent was given IV to delineate the left ventricular endocardial borders. The left ventricular internal cavity size was normal in size. There is no left ventricular hypertrophy. Left ventricular diastolic parameters are consistent with Grade I diastolic dysfunction (impaired relaxation).  LV Wall Scoring: The apical lateral segment, mid inferoseptal segment, apical septal segment, and apex are akinetic. Right Ventricle: The right ventricular size is normal. No increase in right ventricular wall thickness. Right ventricular systolic function is normal. Left Atrium: Left atrial size was normal in size. Right Atrium: Right atrial size was normal in size. Pericardium: There is no evidence of pericardial effusion. Mitral Valve: The mitral valve is normal in structure. No evidence of mitral valve regurgitation. No evidence of mitral valve stenosis. Tricuspid Valve: The tricuspid valve is normal in structure. Tricuspid valve regurgitation is not demonstrated. No evidence of tricuspid stenosis. Aortic Valve: The aortic valve is calcified. There is moderate calcification of the aortic valve. There is moderate thickening of the aortic valve. Aortic valve regurgitation is moderate. Moderate aortic stenosis is present. Aortic valve mean gradient measures 25.2 mmHg. Aortic valve peak gradient measures 40.1 mmHg. Aortic valve area,  by VTI measures 0.67 cm. Pulmonic Valve: The pulmonic valve was normal in structure. Pulmonic valve regurgitation is not visualized. No evidence of pulmonic stenosis. Aorta: The aortic root is normal in size and structure. Venous: The inferior vena cava is dilated in size with less than 50% respiratory variability, suggesting right atrial pressure of 15 mmHg. IAS/Shunts: No atrial level shunt detected by color flow Doppler.  LEFT VENTRICLE PLAX 2D LVIDd:         6.70 cm   Diastology LVIDs:         4.20 cm   LV e' medial:   3.26 cm/s LV PW:         1.10 cm   LV E/e'  medial: 14.7 LV IVS:        1.00 cm LVOT diam:     2.20 cm LV SV:         48 LV SV Index:   27 LVOT Area:     3.80 cm  RIGHT VENTRICLE             IVC RV Basal diam:  2.80 cm     IVC diam: 2.20 cm RV S prime:     11.40 cm/s TAPSE (M-mode): 2.0 cm LEFT ATRIUM           Index        RIGHT ATRIUM           Index LA diam:      3.80 cm 2.16 cm/m   RA Area:     13.50 cm LA Vol (A2C): 43.6 ml 24.84 ml/m  RA Volume:   33.80 ml  19.25 ml/m LA Vol (A4C): 58.1 ml 33.10 ml/m  AORTIC VALVE AV Area (Vmax):    0.79 cm AV Area (Vmean):   0.68 cm AV Area (VTI):     0.67 cm AV Vmax:           316.50 cm/s AV Vmean:          241.000 cm/s AV VTI:            0.712 m AV Peak Grad:      40.1 mmHg AV Mean Grad:      25.2 mmHg LVOT Vmax:         65.70 cm/s LVOT Vmean:        42.900 cm/s LVOT VTI:          0.125 m LVOT/AV VTI ratio: 0.18  AORTA Ao Root diam: 3.10 cm MITRAL VALVE MV Area (PHT): 3.60 cm     SHUNTS MV Decel Time: 211 msec     Systemic VTI:  0.12 m MV E velocity: 48.00 cm/s   Systemic Diam: 2.20 cm MV A velocity: 109.00 cm/s MV E/A ratio:  0.44 Candee Furbish MD Electronically signed by Candee Furbish MD Signature Date/Time: 10/10/2021/1:27:53 PM    Final    VAS [place] LOWER EXTREMITY VENOUS (DVT)  Result Date: 10/10/2021  Lower Venous DVT Study Patient Name:  SANI MADARIAGA  Date of Exam:   10/10/2021 Medical Rec #: [id number]          Accession #:    [id number] Date of Birth: April 20, 1926          Patient Gender: M Patient Age:   24 years Exam Location:  [facility] Procedure:      VAS [place] LOWER EXTREMITY VENOUS (DVT) Referring Phys: Elease Etienne --------------------------------------------------------------------------------  Indications: Pain.  Comparison Study: no prior Performing Technologist: Archie Patten RVS  Examination Guidelines:  A complete evaluation includes B-mode imaging, spectral Doppler, color Doppler, and power Doppler as needed of all accessible portions of each vessel. Bilateral testing is  considered an integral part of a complete examination. Limited examinations for reoccurring indications may be performed as noted. The reflux portion of the exam is performed with the patient in reverse Trendelenburg.  +---------+---------------+---------+-----------+----------+--------------+ RIGHT    CompressibilityPhasicitySpontaneityPropertiesThrombus Aging +---------+---------------+---------+-----------+----------+--------------+ CFV      Full           Yes      Yes                                 +---------+---------------+---------+-----------+----------+--------------+ SFJ      Full                                                        +---------+---------------+---------+-----------+----------+--------------+ FV Prox  Full                                                        +---------+---------------+---------+-----------+----------+--------------+ FV Mid   Full                                                        +---------+---------------+---------+-----------+----------+--------------+ FV DistalFull                                                        +---------+---------------+---------+-----------+----------+--------------+ PFV      Full                                                        +---------+---------------+---------+-----------+----------+--------------+ POP      Full           Yes      Yes                                 +---------+---------------+---------+-----------+----------+--------------+ PTV      Full                                                        +---------+---------------+---------+-----------+----------+--------------+ PERO     Full                                                        +---------+---------------+---------+-----------+----------+--------------+   +---------+---------------+---------+-----------+----------+--------------+  LEFT      CompressibilityPhasicitySpontaneityPropertiesThrombus Aging +---------+---------------+---------+-----------+----------+--------------+ CFV      Full           Yes      Yes                                 +---------+---------------+---------+-----------+----------+--------------+ SFJ      Full                                                        +---------+---------------+---------+-----------+----------+--------------+ FV Prox  Full                                                        +---------+---------------+---------+-----------+----------+--------------+ FV Mid   Full                                                        +---------+---------------+---------+-----------+----------+--------------+ FV DistalFull                                                        +---------+---------------+---------+-----------+----------+--------------+ PFV      Full                                                        +---------+---------------+---------+-----------+----------+--------------+ POP      Full           Yes      Yes                                 +---------+---------------+---------+-----------+----------+--------------+ PTV      Full                                                        +---------+---------------+---------+-----------+----------+--------------+ PERO     Full                                                        +---------+---------------+---------+-----------+----------+--------------+     Summary: BILATERAL: - No evidence of deep vein thrombosis seen in the lower extremities, bilaterally. -No evidence of popliteal cyst, bilaterally.   *See table(s) above for measurements and observations. Electronically signed by Servando Snare MD on 10/10/2021 at 4:34:52 PM.  Final     Cardiac Studies   Echo with EF 40%, moderate AS  Patient Profile     87 y.o. male with NSTEMI, AS, hypoxic resp failure  Assessment & Plan     NSTEMI: Troponin significantly elevated.  LVEF decreased.  REcurrent angina with just moving in bed.  Despite age, he is quite active. Discussed with CCM.  THey feel that his sx are more cardiac than related to pneumonia.  Will plan for R/L cath later today. Renal function stable. Of note, he lives independently.  He drives and takes care of himself.  AS: moderate aortic stenosis by echo.  Will reassess by cath.    Pneumonia: being treated with Antibiotics.  Lasix for pulm edema.   He has no children.  He has an elderly cousin who cannot visit.  His cousin's daughter has visited.    The patient understands that risks include but are not limited to stroke (1 in 1000), death (1 in 70), kidney failure [usually temporary] (1 in 500), bleeding (1 in 200), allergic reaction [possibly serious] (1 in 200), and agrees to proceed.    CRITICAL CARE Performed by: Larae Grooms   Total critical care time: 40 minutes  Critical care time was exclusive of separately billable procedures and treating other patients.  Critical care was necessary to treat or prevent imminent or life-threatening deterioration.  Critical care was time spent personally by me on the following activities: development of treatment plan with patient and/or surrogate as well as nursing, discussions with consultants, evaluation of patient's response to treatment, examination of patient, obtaining history from patient or surrogate, ordering and performing treatments and interventions, ordering and review of laboratory studies, ordering and review of radiographic studies, pulse oximetry and re-evaluation of patient's condition.        For questions or updates, please contact [place] Please consult [email or web address] for contact info under        Signed, Larae Grooms, MD  10/11/2021, 9:13 AM

## 2021-10-11 NOTE — Progress Notes (Signed)
Pt currently off BIPAP on 6 L [place]. Pt is tolerating well at this time. RT to continue to monitor.

## 2021-10-12 ENCOUNTER — Encounter (HOSPITAL_COMMUNITY): Payer: Self-pay | Admitting: Pulmonary Disease

## 2021-10-12 ENCOUNTER — Other Ambulatory Visit: Payer: Self-pay

## 2021-10-12 ENCOUNTER — Inpatient Hospital Stay (HOSPITAL_COMMUNITY): Payer: Medicare Other

## 2021-10-12 DIAGNOSIS — R570 Cardiogenic shock: Secondary | ICD-10-CM

## 2021-10-12 DIAGNOSIS — J9601 Acute respiratory failure with hypoxia: Secondary | ICD-10-CM | POA: Diagnosis not present

## 2021-10-12 DIAGNOSIS — Z710 Person encountering health services to consult on behalf of another person: Secondary | ICD-10-CM

## 2021-10-12 DIAGNOSIS — I350 Nonrheumatic aortic (valve) stenosis: Secondary | ICD-10-CM | POA: Diagnosis not present

## 2021-10-12 DIAGNOSIS — D649 Anemia, unspecified: Secondary | ICD-10-CM

## 2021-10-12 DIAGNOSIS — I214 Non-ST elevation (NSTEMI) myocardial infarction: Secondary | ICD-10-CM | POA: Diagnosis not present

## 2021-10-12 DIAGNOSIS — J189 Pneumonia, unspecified organism: Secondary | ICD-10-CM | POA: Diagnosis not present

## 2021-10-12 LAB — CBC
HCT: 37 % — ABNORMAL LOW (ref 39.0–52.0)
Hemoglobin: 12.2 g/dL — ABNORMAL LOW (ref 13.0–17.0)
MCH: 30.7 pg (ref 26.0–34.0)
MCHC: 33 g/dL (ref 30.0–36.0)
MCV: 93.2 fL (ref 80.0–100.0)
Platelets: 233 10*3/uL (ref 150–400)
RBC: 3.97 MIL/uL — ABNORMAL LOW (ref 4.22–5.81)
RDW: 14.1 % (ref 11.5–15.5)
WBC: 18.2 10*3/uL — ABNORMAL HIGH (ref 4.0–10.5)
nRBC: 0 % (ref 0.0–0.2)

## 2021-10-12 LAB — BASIC METABOLIC PANEL
Anion gap: 14 (ref 5–15)
Anion gap: 7 (ref 5–15)
Anion gap: 10 (ref 5–15)
BUN: 34 mg/dL — ABNORMAL HIGH (ref 8–23)
BUN: 33 mg/dL — ABNORMAL HIGH (ref 8–23)
BUN: 35 mg/dL — ABNORMAL HIGH (ref 8–23)
CO2: 22 mmol/L (ref 22–32)
CO2: 25 mmol/L (ref 22–32)
CO2: 23 mmol/L (ref 22–32)
Calcium: 8.4 mg/dL — ABNORMAL LOW (ref 8.9–10.3)
Calcium: 8.7 mg/dL — ABNORMAL LOW (ref 8.9–10.3)
Calcium: 9 mg/dL (ref 8.9–10.3)
Chloride: 101 mmol/L (ref 98–111)
Chloride: 102 mmol/L (ref 98–111)
Chloride: 103 mmol/L (ref 98–111)
Creatinine, Ser: 0.82 mg/dL (ref 0.61–1.24)
Creatinine, Ser: 0.87 mg/dL (ref 0.61–1.24)
Creatinine, Ser: 0.88 mg/dL (ref 0.61–1.24)
GFR, Estimated: >60 mL/min (ref >60)
GFR, Estimated: >60 mL/min (ref >60)
GFR, Estimated: >60 mL/min (ref >60)
Glucose, Bld: 110 mg/dL — ABNORMAL HIGH (ref 70–99)
Glucose, Bld: 109 mg/dL — ABNORMAL HIGH (ref 70–99)
Glucose, Bld: 99 mg/dL (ref 70–99)
Potassium: 3.5 mmol/L (ref 3.5–5.1)
Potassium: 3.7 mmol/L (ref 3.5–5.1)
Potassium: 4.6 mmol/L (ref 3.5–5.1)
Sodium: 137 mmol/L (ref 135–145)
Sodium: 134 mmol/L — ABNORMAL LOW (ref 135–145)
Sodium: 136 mmol/L (ref 135–145)

## 2021-10-12 LAB — PROCALCITONIN: Procalcitonin: 1.67 ng/mL

## 2021-10-12 LAB — HEPARIN LEVEL (UNFRACTIONATED): Heparin Unfractionated: 0.3 IU/mL (ref 0.30–0.70)

## 2021-10-12 LAB — GLUCOSE, CAPILLARY
Glucose-Capillary: 106 mg/dL — ABNORMAL HIGH (ref 70–99)
Glucose-Capillary: 112 mg/dL — ABNORMAL HIGH (ref 70–99)
Glucose-Capillary: 140 mg/dL — ABNORMAL HIGH (ref 70–99)
Glucose-Capillary: 99 mg/dL (ref 70–99)
Glucose-Capillary: 119 mg/dL — ABNORMAL HIGH (ref 70–99)
Glucose-Capillary: 106 mg/dL — ABNORMAL HIGH (ref 70–99)

## 2021-10-12 MED ORDER — AMOXICILLIN-POT CLAVULANATE 875-125 MG PO TABS
1.0000 | ORAL_TABLET | Freq: Two times a day (BID) | ORAL | Status: AC
  Administered 2021-10-12 – 2021-10-14 (×6): 1 via ORAL
  Filled 2021-10-12 (×9): qty 1

## 2021-10-12 MED ORDER — POTASSIUM CHLORIDE CRYS ER 20 MEQ PO TBCR
40.0000 meq | EXTENDED_RELEASE_TABLET | Freq: Once | ORAL | Status: AC
  Administered 2021-10-12: 40 meq via ORAL
  Filled 2021-10-12: qty 2

## 2021-10-12 MED ORDER — ENOXAPARIN SODIUM 40 MG/0.4ML IJ SOSY
40.0000 mg | PREFILLED_SYRINGE | INTRAMUSCULAR | Status: DC
  Administered 2021-10-12 – 2021-10-17 (×3): 40 mg via SUBCUTANEOUS
  Filled 2021-10-12 (×5): qty 0.4

## 2021-10-12 MED ORDER — POTASSIUM CHLORIDE CRYS ER 20 MEQ PO TBCR
40.0000 meq | EXTENDED_RELEASE_TABLET | ORAL | Status: AC
Start: 2021-10-12 — End: 2021-10-12
  Administered 2021-10-12 (×2): 40 meq via ORAL
  Filled 2021-10-12 (×2): qty 2

## 2021-10-12 NOTE — Progress Notes (Signed)
[place] for heparin Indication: chest pain/ACS  No Known Allergies  Patient Measurements: Weight: 63.6 kg (140 lb 3.4 oz) Heparin Dosing Weight: 70kg  Vital Signs: Temp: 97.8 F (36.6 C) (10/19 0300) Temp Source: Oral (10/19 0300) BP: 191/118 (10/19 0600) Pulse Rate: 67 (10/19 0600)  Labs: Recent Labs    10/09/21 1710 10/09/21 1921 10/10/21 0130 10/10/21 0408 10/10/21 0500 10/10/21 1051 10/10/21 1412 10/11/21 0601 10/11/21 1112 10/11/21 1115 10/11/21 1116 10/11/21 1649 10/12/21 0426  HGB  --   --  14.2   < >  --   --   --  12.7*   < > 13.3 13.3  --  12.2*  HCT  --   --  43.4   < >  --   --   --  37.8*   < > 39.0 39.0  --  37.0*  PLT  --   --  265  --   --   --   --  233  --   --   --   --  233  HEPARINUNFRC   < >  --  <0.10*  --   --  0.31  --  0.18*  --   --   --   --  0.30  CREATININE  --   --  0.93  --   --   --    < > 0.80  --   --   --  0.95 0.82  TROPONINIHS  --  1,071*  --   --  20,610*  --   --  7,652*  --   --   --   --   --    < > = values in this interval not displayed.     Estimated Creatinine Clearance: 49.9 mL/min (by C-G formula based on SCr of 0.82 mg/dL).   Medical History: Past Medical History:  Diagnosis Date   Allergy    Cataract    Chest pain 10/09/2021   Enlarged prostate    Heart murmur    Hypercholesteremia    Hypertension    Sepsis with acute hypoxic respiratory failure and septic shock ([place]) 10/09/2021    Assessment: 89 YOM presenting with respiratory distress, elevated troponin, he is not on anticoagulation PTA.  CBC wnl. Heparin started for ACS.  Went for re-look cath yesterday. No intervention done, but may go back for a high risk PCI later in the week. Post-cath heparin restarted at prior rate. Heparin level therapeutic this morning at 0.3. Will increase level slightly to keep therapeutic.   Hemoglobin dropped 13.3 to 12.2 and platelets stable. However, no bleeding noted per RN. Will  continue to monitor for bleeding.    Goal of Therapy:  Heparin level 0.3-0.7 units/ml Monitor platelets by anticoagulation protocol: Yes   Plan:  Increase heparin to 1350 units/hr Daily CBC and HL while on heparin  Cathrine Muster, PharmD PGY2 Cardiology Pharmacy Resident Phone: [phone] 10/12/2021  7:22 AM  Please check [email or web address] for unit-specific pharmacy phone numbers.  10/12/2021

## 2021-10-12 NOTE — Progress Notes (Signed)
Progress Note  Patient Name: Nicholas Finley Date of Encounter: 10/12/2021  [facility] Cardiologist: None Havilah Topor  Subjective   No chest pain.  Feels much better.  Has walked more in the room, getting to the chair and going to the bathroom.  He wanted to walk more but the nurse prevented him from going out into the hallway.  Breathing has improved significantly.  Inpatient Medications    Scheduled Meds:  aspirin EC  81 mg Oral Daily   atorvastatin  40 mg Oral Daily   Chlorhexidine Gluconate Cloth  6 each Topical Daily   clopidogrel  75 mg Oral Daily   furosemide  40 mg Intravenous Q12H   insulin aspart  0-9 Units Subcutaneous Q4H   potassium chloride  40 mEq Oral Once   sodium chloride flush  3 mL Intravenous Q12H   sodium chloride flush  3 mL Intravenous Q12H   Continuous Infusions:  sodium chloride     ceFEPime (MAXIPIME) IV Stopped (10/11/21 2308)   heparin 1,350 Units/hr (10/12/21 0804)   milrinone 0.25 mcg/kg/min (10/12/21 0800)   nitroGLYCERIN Stopped (10/11/21 2202)   PRN Meds: sodium chloride, docusate sodium, nitroGLYCERIN, polyethylene glycol, sodium chloride flush   Vital Signs    Vitals:   10/12/21 0400 10/12/21 0500 10/12/21 0600 10/12/21 0800  BP: (!) 135/93 136/64    Pulse: 76 79 67 96  Resp: 15 15 (!) 21 18  Temp:      TempSrc:      SpO2: 97% 99% (!) 85% 97%  Weight:   63.6 kg     Intake/Output Summary (Last 24 hours) at 10/12/2021 0829 Last data filed at 10/12/2021 0800 Gross per 24 hour  Intake 478.26 ml  Output 3900 ml  Net -3421.74 ml   Last 3 Weights 10/12/2021 10/09/2021 09/07/2021  Weight (lbs) 140 lb 3.4 oz 149 lb 149 lb  Weight (kg) 63.6 kg 67.586 kg 67.586 kg      Telemetry    Normal sinus rhythm- Personally Reviewed  ECG      Physical Exam   GEN: No acute distress.   Neck: No JVD Cardiac: RRR, no murmurs, rubs, or gallops.  Respiratory: Clear to auscultation bilaterally. GI: Soft, nontender, non-distended   MS: No edema; No deformity. Neuro:  Nonfocal  Psych: Normal affect   Labs    High Sensitivity Troponin:   Recent Labs  Lab 10/09/21 1710 10/09/21 1921 10/10/21 0500 10/11/21 0601  TROPONINIHS 155* 1,071* 20,610* 7,652*     Chemistry Recent Labs  Lab 10/09/21 1710 10/09/21 1720 10/10/21 0130 10/10/21 0408 10/11/21 0601 10/11/21 1112 10/11/21 1116 10/11/21 1649 10/12/21 0426  NA 139   < > 136   < > 138   < > 140 138 137  K 4.8   < > 4.1   < > 3.4*   < > 3.5 3.2* 3.5  CL 107  --  108   < > 107  --   --  102 101  CO2 15*  --  18*   < > 21*  --   --  26 22  GLUCOSE 295*  --  153*   < > 109*  --   --  156* 110*  BUN 23  --  28*   < > 32*  --   --  31* 34*  CREATININE 1.47*  --  0.93   < > 0.80  --   --  0.95 0.82  CALCIUM 8.8*  --  8.3*   < >  8.6*  --   --  8.7* 8.4*  MG  --   --  2.2  --   --   --   --   --   --   PROT 6.9  --   --   --   --   --   --   --   --   ALBUMIN 3.3*  --   --   --   --   --   --   --   --   AST 114*  --   --   --   --   --   --   --   --   ALT 66*  --   --   --   --   --   --   --   --   ALKPHOS 70  --   --   --   --   --   --   --   --   BILITOT 0.9  --   --   --   --   --   --   --   --   GFRNONAA 44*  --  >60   < > >60  --   --  >60 >60  ANIONGAP 17*  --  10   < > 10  --   --  10 14   < > = values in this interval not displayed.    Lipids  Recent Labs  Lab 10/11/21 0746  CHOL 128  TRIG 83  HDL 56  LDLCALC 55  CHOLHDL 2.3    Hematology Recent Labs  Lab 10/10/21 0130 10/10/21 0408 10/11/21 0601 10/11/21 1112 10/11/21 1115 10/11/21 1116 10/12/21 0426  WBC 29.5*  --  19.9*  --   --   --  18.2*  RBC 4.53  --  4.03*  --   --   --  3.97*  HGB 14.2   < > 12.7*   < > 13.3 13.3 12.2*  HCT 43.4   < > 37.8*   < > 39.0 39.0 37.0*  MCV 95.8  --  93.8  --   --   --  93.2  MCH 31.3  --  31.5  --   --   --  30.7  MCHC 32.7  --  33.6  --   --   --  33.0  RDW 14.1  --  14.3  --   --   --  14.1  PLT 265  --  233  --   --   --  233   < >  = values in this interval not displayed.   Thyroid No results for input(s): TSH, FREET4 in the last 168 hours.  BNP Recent Labs  Lab 10/09/21 1710 10/10/21 1410 10/11/21 0601  BNP 625.3* 1,802.6* 945.1*    DDimer No results for input(s): DDIMER in the last 168 hours.   Radiology    CARDIAC CATHETERIZATION  Result Date: 10/11/2021   Ost LM lesion is 95% stenosed.   Prox LAD to Mid LAD lesion is 70% stenosed.   Ost Cx to Prox Cx lesion is 80% stenosed.   LV end diastolic pressure is mildly elevated.   Hemodynamic findings consistent with mild pulmonary hypertension.   There is severe aortic valve stenosis. Critical ostial left  main stenosis - heavily calcified Moderate proximal LAD and LCx stenosis also heavily calcified Severe aortic stenosis. Mean gradient 28 mm Hg with AVA 0.72  cm squared, index 0.41 Elevated LV filling pressures - mean PCWP 24 mm Hg Mild pulmonary HTN. Mean PAP 33 mm Hg Low cardiac output 3.49 L/min with index 1.97. Plan: patient has very high risk anatomy with critical ostial Left main stenosis and severe AS. Will continue IV diuresis. Add inotropic support with IV milrinone. Will need heart team approach to review possible treatment options.   DG Chest Port 1 View  Result Date: 10/12/2021 CLINICAL DATA:  Pulmonary edema ,confusion ,hx resp distress EXAM: PORTABLE CHEST - 1 VIEW COMPARISON:  the previous day's study FINDINGS: Improving perihilar opacities with only mild interstitial residual. Some improvement in the left retrocardiac consolidation. Patchy airspace disease in the right infrahilar region has slightly increased. Heart size within normal limits. Aortic Atherosclerosis (ICD10-170.0). No pneumothorax.  Possible small pleural effusions. Visualized bones unremarkable. IMPRESSION: Partial improvement in asymmetric pulmonary edema/infiltrates. Suspect small pleural effusions. Electronically Signed   By: Lucrezia Europe M.D.   On: 10/12/2021 06:38   DG Chest Port 1  View  Result Date: 10/11/2021 CLINICAL DATA:  Pulmonary edema, confusion and shortness of breath this morning EXAM: PORTABLE CHEST 1 VIEW COMPARISON:  Chest radiograph dated 1 day prior FINDINGS: The heart is enlarged, unchanged. The mediastinal contours are stable. Again seen are patchy perihilar opacities in both lungs likely reflecting moderate pulmonary interstitial edema. Aeration of the left base has worsened with slight interval enlargement of a left pleural effusion. There is probably a trace right pleural effusion. There is no pneumothorax. There is no acute osseous abnormality. IMPRESSION: Worsening aeration of the left base with slight interval enlargement of a small left pleural effusion. Perihilar opacities are not significantly changed. Findings again likely reflect moderate pulmonary interstitial edema, though superimposed infection cannot be excluded. Electronically Signed   By: Valetta Mole M.D.   On: 10/11/2021 08:32   ECHOCARDIOGRAM COMPLETE  Result Date: 10/10/2021    ECHOCARDIOGRAM REPORT   Patient Name:   DIJON COSENS Date of Exam: 10/10/2021 Medical Rec #:  [id number]         Height:       65.5 in Accession #:    [id number]        Weight:       149.0 lb Date of Birth:  19-Mar-1926         BSA:          1.755 m Patient Age:    57 years          BP:           129/69 mmHg Patient Gender: M                 HR:           75 bpm. Exam Location:  Inpatient Procedure: 2D Echo, Cardiac Doppler, Color Doppler and Intracardiac            Opacification Agent Indications:    NSTEMI  History:        Patient has no prior history of Echocardiogram examinations.                 Signs/Symptoms:Cerebellar hemmorhage; Risk Factors:Hypertension.  Sonographer:    Merrie Roof RDCS Referring Phys: [id number] [place]  1. Left ventricular ejection fraction, by estimation, is 40 to 45%. The left ventricle has mildly decreased function. The left ventricle demonstrates regional wall motion  abnormalities (see scoring diagram/findings for description). Left ventricular diastolic parameters are consistent with Grade I diastolic dysfunction (impaired relaxation).  2. Right ventricular systolic function is normal. The right ventricular size is normal.  3. The mitral valve is normal in structure. No evidence of mitral valve regurgitation. No evidence of mitral stenosis.  4. The aortic valve is calcified. There is moderate calcification of the aortic valve. There is moderate thickening of the aortic valve. Aortic valve regurgitation is moderate. Moderate aortic valve stenosis. Aortic valve mean gradient measures 25.2 mmHg. Aortic valve Vmax measures 3.16 m/s.  5. The inferior vena cava is dilated in size with <50% respiratory variability, suggesting right atrial pressure of 15 mmHg. FINDINGS  Left Ventricle: Left ventricular ejection fraction, by estimation, is 40 to 45%. The left ventricle has mildly decreased function. The left ventricle demonstrates regional wall motion abnormalities. Definity contrast agent was given IV to delineate the left ventricular endocardial borders. The left ventricular internal cavity size was normal in size. There is no left ventricular hypertrophy. Left ventricular diastolic parameters are consistent with Grade I diastolic dysfunction (impaired relaxation).  LV Wall Scoring: The apical lateral segment, mid inferoseptal segment, apical septal segment, and apex are akinetic. Right Ventricle: The right ventricular size is normal. No increase in right ventricular wall thickness. Right ventricular systolic function is normal. Left Atrium: Left atrial size was normal in size. Right Atrium: Right atrial size was normal in size. Pericardium: There is no evidence of pericardial effusion. Mitral Valve: The mitral valve is normal in structure. No evidence of mitral valve regurgitation. No evidence of mitral valve stenosis. Tricuspid Valve: The tricuspid valve is normal in structure.  Tricuspid valve regurgitation is not demonstrated. No evidence of tricuspid stenosis. Aortic Valve: The aortic valve is calcified. There is moderate calcification of the aortic valve. There is moderate thickening of the aortic valve. Aortic valve regurgitation is moderate. Moderate aortic stenosis is present. Aortic valve mean gradient measures 25.2 mmHg. Aortic valve peak gradient measures 40.1 mmHg. Aortic valve area, by VTI measures 0.67 cm. Pulmonic Valve: The pulmonic valve was normal in structure. Pulmonic valve regurgitation is not visualized. No evidence of pulmonic stenosis. Aorta: The aortic root is normal in size and structure. Venous: The inferior vena cava is dilated in size with less than 50% respiratory variability, suggesting right atrial pressure of 15 mmHg. IAS/Shunts: No atrial level shunt detected by color flow Doppler.  LEFT VENTRICLE PLAX 2D LVIDd:         6.70 cm   Diastology LVIDs:         4.20 cm   LV e' medial:   3.26 cm/s LV PW:         1.10 cm   LV E/e' medial: 14.7 LV IVS:        1.00 cm LVOT diam:     2.20 cm LV SV:         48 LV SV Index:   27 LVOT Area:     3.80 cm  RIGHT VENTRICLE             IVC RV Basal diam:  2.80 cm     IVC diam: 2.20 cm RV S prime:     11.40 cm/s TAPSE (M-mode): 2.0 cm LEFT ATRIUM           Index        RIGHT ATRIUM           Index LA diam:      3.80 cm 2.16 cm/m   RA Area:     13.50 cm LA Vol (A2C): 43.6 ml 24.84 ml/m  RA Volume:   33.80 ml  19.25 ml/m LA Vol (A4C): 58.1 ml 33.10 ml/m  AORTIC VALVE AV Area (Vmax):    0.79 cm AV Area (Vmean):   0.68 cm AV Area (VTI):     0.67 cm AV Vmax:           316.50 cm/s AV Vmean:          241.000 cm/s AV VTI:            0.712 m AV Peak Grad:      40.1 mmHg AV Mean Grad:      25.2 mmHg LVOT Vmax:         65.70 cm/s LVOT Vmean:        42.900 cm/s LVOT VTI:          0.125 m LVOT/AV VTI ratio: 0.18  AORTA Ao Root diam: 3.10 cm MITRAL VALVE MV Area (PHT): 3.60 cm     SHUNTS MV Decel Time: 211 msec     Systemic VTI:   0.12 m MV E velocity: 48.00 cm/s   Systemic Diam: 2.20 cm MV A velocity: 109.00 cm/s MV E/A ratio:  0.44 Candee Furbish MD Electronically signed by Candee Furbish MD Signature Date/Time: 10/10/2021/1:27:53 PM    Final    VAS [place] LOWER EXTREMITY VENOUS (DVT)  Result Date: 10/10/2021  Lower Venous DVT Study Patient Name:  TURON KILMER  Date of Exam:   10/10/2021 Medical Rec #: [id number]          Accession #:    [id number] Date of Birth: 07/18/1926          Patient Gender: M Patient Age:   72 years Exam Location:  [facility] Procedure:      VAS [place] LOWER EXTREMITY VENOUS (DVT) Referring Phys: Elease Etienne --------------------------------------------------------------------------------  Indications: Pain.  Comparison Study: no prior Performing Technologist: Archie Patten RVS  Examination Guidelines: A complete evaluation includes B-mode imaging, spectral Doppler, color Doppler, and power Doppler as needed of all accessible portions of each vessel. Bilateral testing is considered an integral part of a complete examination. Limited examinations for reoccurring indications may be performed as noted. The reflux portion of the exam is performed with the patient in reverse Trendelenburg.  +---------+---------------+---------+-----------+----------+--------------+ RIGHT    CompressibilityPhasicitySpontaneityPropertiesThrombus Aging +---------+---------------+---------+-----------+----------+--------------+ CFV      Full           Yes      Yes                                 +---------+---------------+---------+-----------+----------+--------------+ SFJ      Full                                                        +---------+---------------+---------+-----------+----------+--------------+ FV Prox  Full                                                        +---------+---------------+---------+-----------+----------+--------------+ FV Mid   Full                                                         +---------+---------------+---------+-----------+----------+--------------+  FV DistalFull                                                        +---------+---------------+---------+-----------+----------+--------------+ PFV      Full                                                        +---------+---------------+---------+-----------+----------+--------------+ POP      Full           Yes      Yes                                 +---------+---------------+---------+-----------+----------+--------------+ PTV      Full                                                        +---------+---------------+---------+-----------+----------+--------------+ PERO     Full                                                        +---------+---------------+---------+-----------+----------+--------------+   +---------+---------------+---------+-----------+----------+--------------+ LEFT     CompressibilityPhasicitySpontaneityPropertiesThrombus Aging +---------+---------------+---------+-----------+----------+--------------+ CFV      Full           Yes      Yes                                 +---------+---------------+---------+-----------+----------+--------------+ SFJ      Full                                                        +---------+---------------+---------+-----------+----------+--------------+ FV Prox  Full                                                        +---------+---------------+---------+-----------+----------+--------------+ FV Mid   Full                                                        +---------+---------------+---------+-----------+----------+--------------+ FV DistalFull                                                        +---------+---------------+---------+-----------+----------+--------------+   PFV      Full                                                         +---------+---------------+---------+-----------+----------+--------------+ POP      Full           Yes      Yes                                 +---------+---------------+---------+-----------+----------+--------------+ PTV      Full                                                        +---------+---------------+---------+-----------+----------+--------------+ PERO     Full                                                        +---------+---------------+---------+-----------+----------+--------------+     Summary: BILATERAL: - No evidence of deep vein thrombosis seen in the lower extremities, bilaterally. -No evidence of popliteal cyst, bilaterally.   *See table(s) above for measurements and observations. Electronically signed by Servando Snare MD on 10/10/2021 at 4:34:52 PM.    Final     Cardiac Studies   Cath films reviewed  Patient Profile     87 y.o. male with severe left main and proximal LAD disease.  He also has moderate to severe aortic stenosis.  Assessment & Plan    NSTEMI: I have reviewed the angiograms with several of the interventionalists.  Any type of intervention would be high risk and would require protected PCI with Impella due to location of lesions.  He would need atherectomy.  Risks would include bleeding, periprocedural MI, stroke and potentially death during the procedure.  Renal function has been stable.  In addition, we will have to decide whether management of his aortic stenosis will be needed with either balloon aortic valvuloplasty or down the road, TAVR.  I discussed all of this with the patient at length.  He is not interested in high risk procedures.  He is feeling much better.  He prefers trying just medical therapy.  We will have palliative care evaluate him as well.  Discussed with critical care medicine.  I think hospice is also a reasonable consideration as his 21-month mortality with severe left main disease and significant aortic stenosis is  quite high.  Patient was loaded with clopidogrel.  Continue IV heparin for now.  Continue diuresis as renal function allows.  Optimizing him from a heart failure standpoint will be beneficial as well.  He has diuresed well.  We will stop milrinone.  Will continue to use furosemide.  He does not have a lot of close family nearby.  He has an elderly cousin.  The cousin's daughter also keeps in touch with him.  We will try to include these people in the discussion with him to discuss benefits and risks.  I was able to speak to [name],  his cousin's daughter.  She is the emergency contact.  She states that she found his advanced directives which were done about 10 years ago in which he stated he did not want aggressive, invasive procedures done.  She is in agreement with the current plan for palliative care.  She is most concerned about how he will manage at home.  We will try to obtain as many resources for him as we can.     For questions or updates, please contact [place] Please consult [email or web address] for contact info under        Signed, Larae Grooms, MD  10/12/2021, 8:29 AM

## 2021-10-12 NOTE — Discharge Instructions (Signed)

## 2021-10-12 NOTE — Progress Notes (Addendum)
NAME:  Nicholas Finley, MRN:  [id number], DOB:  December 01, 1926, LOS: 3 ADMISSION DATE:  10/09/2021, CONSULTATION DATE:  10/12/21 REFERRING MD:  Maryan Rued, CHIEF COMPLAINT:  Shortness of breath   History of Present Illness:  Nicholas Finley is a 87 y.o. M with PMH of HTN, HL, who presented with worsening chest pain and shortness of breath over the last two days.  He reports gradually worsening dyspnea over several weeks, but acutely worse day of admit.  On EMS arrival, pt apparently had oxygen saturations in the 60%'s and EKG with concern for ST depressions in inferior and lateral leads.      Pertinent  Medical History   has a past medical history of Allergy, Cataract, Chest pain (10/09/2021), Enlarged prostate, Heart murmur, Hypercholesteremia, Hypertension, and Sepsis with acute hypoxic respiratory failure and septic shock ([place]) (10/09/2021). Occasional smoker. No known h/o COPD  Significant Hospital Events: Including procedures, antibiotic start and stop dates in addition to other pertinent events   10/16 presented with CP/dyspnea: likely NSTEMI, On arrival to the ED, patient was placed on Bipap with improvement.  He was started on a Nitro gtt for chest pain and HTN.  CXR with bilateral airspace disease and labs significant for WBC of 21k, troponin 155->1071, lactic acid 8.5.  He was seen by cardiology and started on Asa and heparin gtt.  Vanco and cefepime started empirically.  BNP 625. NTG stopped d/t BP 9/17 respiratory viral panel negative. IV lasix given  9/18: Chest pain had changed over the course of the evening to just with exertion.  When BiPAP was removed during a.m. hours had fairly acute chest discomfort.  Plan of care discussed with cardiology, he was given sublingual nitroglycerin, IV Lasix, placed back on BiPAP, and then plans made for cardiac catheterization. Left heart catheterization with severely diseased coronary arteries specifically: Critical ostial left main stenosis which was  heavily calcified, moderate proximal LAD and circumflex stenosis which was also heavily calcified.  Severe aortic stenosis.  Right heart cath showing elevated pulmonary capillary wedge pressure at 24 cardiac index 1.97 No distress.  Now on supplemental oxygen off BiPAP.  No chest pain.  Mobilizing.  Still on milrinone; change to Augmentin.  Goals of care discussion had at bedside with patient.  Patient sharing he does not want to proceed with advanced cardiac procedures however he has not talked to cardiology as of yet.  We did talk about goals of care in general.  He is now a full DO NOT RESUSCITATE.  He would however want medical management including potential hospital admission however he would not want intubation nor would he want CPR.  Palliative care consulted to review MOST form with him  Interim History / Subjective:  Anxious to go home reports he does not want to proceed with further cardiac procedures  Objective   Blood pressure 136/64, pulse 96, temperature 97.8 F (36.6 C), temperature source Oral, resp. rate 18, weight 63.6 kg, SpO2 97 %.    Vent Mode: BIPAP;PCV FiO2 (%):  [40 %] 40 % Set Rate:  [10 bmp] 10 bmp PEEP:  [6 cmH20] 6 cmH20   Intake/Output Summary (Last 24 hours) at 10/12/2021 0941 Last data filed at 10/12/2021 0800 Gross per 24 hour  Intake 478.26 ml  Output 3900 ml  Net -3421.74 ml   Filed Weights   10/09/21 2105 10/12/21 0600  Weight: 67.6 kg 63.6 kg   General-year-old this is a pleasant 87 year old male he sitting in bed and he is in no  acute distress today HEENT normocephalic atraumatic no jugular venous distention appreciated Pulmonary fine crackles bases currently on nasal cannula support no accessory use Cardiac murmur consistent with AS regular rate and rhythm Abdomen soft not tender Extremities warm dry with brisk capillary refill Neuro awake oriented but impulsive   Resolved Hospital Problem list   Lactic acidosis cleared, this was secondary to  work of breathing and hypoxia AKI resolved Hyperglycemia resolved Assessment & Plan:   Acute Hypoxic Respiratory Failure secondary to diffuse pulmonary edema, +/- PNA;  BiPAP breathing easily Plan Continue supplemental oxygen  Change BiPAP to as needed  Continuing IV Lasix  Antibiotic day #4 of 5  Incentive spirometry Mobilize  NSTEMI/Demand Ischemia, with acute systolic heart failure Aortic stenosis Plan Continue telemetry monitoring Continue aspirin and statin Continue Plavix Continue to push diuresis IV heparin to continue for now until determined by heart team Currently on milrinone will defer this to cardiology The patient is leaning against further cardiac intervention  Leukocytosis  -aspiration vs reactive Plan Continue to monitor   Fluid and electrolyte imbalance: Hypokalemia Plan Replace and recheck as indicated   Elevated Transaminsases Mild, Likely secondary hepatic congestion from heart failure Plan A.m. LFTs Best Practice (right click and "Reselect all SmartList Selections" daily)   Diet/type: Regular consistency (see orders) DVT prophylaxis: systemic heparin GI prophylaxis: N/A Lines: N/A Foley:  N/A Code Status:  DNR Last date of multidisciplinary goals of care discussion [pending] My cct 38 min    Erick Colace ACNP-BC [place] Pager # [phone] OR # [phone] if no answer   Attending attestation: Nicholas Finley is a 87 y/o gentleman admitted with NSTEMI who has severe CAD, including severe L main disease and severe AS. He denies CP today. He is not interested in high-risk invasive intervention and favors med management.  BP 97/64 (BP Location: Left Arm)   Pulse (!) 104   Temp 98.8 F (37.1 C) (Oral)   Resp 15   Wt 63.6 kg   SpO2 95%   BMI 22.98 kg/m  Elderly man sitting up in bed in [place]/AT, eyes anicteric Breathing comfortably on RA, CTAB, no conversational dyspnea. S1S2, RRR Abd soft, NT No LE edema,  no cyanosis Awake and alert, moving all extremities. Answering questions appropriately.  Appropriate insight. Cooperative with exam. Skin warm, dry  BUN 35 Cr 0.88  Assessment & plan: Acute cardiogenic shock NSTEMI CAD w/ Critical L main disease, severe Lcx, LAD Severe AS -Wean off milrinone; hold Bblocker currently. -ASA & plavix daily -statin NTG PRN -discussions regarding preferences with possible invasive interventions-- he does not favor this due to his age and he is aware that his remaining time may be short, but would be fine with being comfortable at home with help. -if recurrent symptoms off milrinone, would consider withdrawal of care in the ICU.  Acute respiratory failure with hypoxia- resolved Possible CAP -agree with switching to augmentin, complete 7 days antibiotics -bipap PRN if recurrent acute pulmonary edema  Acute anemia -transfuse for Hb<8 or hemodynamically significant bleeding  Once off milrinone ok to leave ICU  This patient is critically ill with multiple organ system failure which requires frequent high complexity decision making, assessment, support, evaluation, and titration of therapies. This was completed through the application of advanced monitoring technologies and extensive interpretation of multiple databases. During this encounter critical care time was devoted to patient care services described in this note for 34 minutes.  Julian Hy, DO 10/12/21 7:26 PM [place]  Pulmonary & Critical Care

## 2021-10-12 NOTE — Progress Notes (Signed)
[facility] ADULT ICU REPLACEMENT PROTOCOL   The patient does apply for the [facility] Adult ICU Electrolyte Replacment Protocol based on the criteria listed below:   1.Exclusion criteria: TCTS patients, ECMO patients, and Dialysis patients 2. Is GFR >/= 30 ml/min? Yes.    Patient's GFR today is >60 3. Is SCr </= 2? No. Patient's SCr is 0.82 mg/dL 4. Did SCr increase >/= 0.5 in 24 hours? No. 5.Pt's weight >40kg  Yes.   6. Abnormal electrolyte(s): K 3.5  7. Electrolytes replaced per protocol   Christeen Douglas 10/12/2021 6:10 AM

## 2021-10-13 ENCOUNTER — Encounter (HOSPITAL_COMMUNITY)
Admission: EM | Disposition: A | Discharge status: Home Health | Payer: Self-pay | Source: Home / Self Care | Attending: Student

## 2021-10-13 DIAGNOSIS — I10 Essential (primary) hypertension: Secondary | ICD-10-CM | POA: Diagnosis not present

## 2021-10-13 DIAGNOSIS — Z7189 Other specified counseling: Secondary | ICD-10-CM | POA: Diagnosis not present

## 2021-10-13 DIAGNOSIS — I350 Nonrheumatic aortic (valve) stenosis: Secondary | ICD-10-CM | POA: Diagnosis not present

## 2021-10-13 DIAGNOSIS — I214 Non-ST elevation (NSTEMI) myocardial infarction: Secondary | ICD-10-CM | POA: Diagnosis not present

## 2021-10-13 DIAGNOSIS — Z515 Encounter for palliative care: Secondary | ICD-10-CM | POA: Diagnosis not present

## 2021-10-13 LAB — BASIC METABOLIC PANEL
Anion gap: 11 (ref 5–15)
BUN: 34 mg/dL — ABNORMAL HIGH (ref 8–23)
CO2: 24 mmol/L (ref 22–32)
Calcium: 8.9 mg/dL (ref 8.9–10.3)
Chloride: 102 mmol/L (ref 98–111)
Creatinine, Ser: 0.94 mg/dL (ref 0.61–1.24)
GFR, Estimated: >60 mL/min (ref >60)
Glucose, Bld: 106 mg/dL — ABNORMAL HIGH (ref 70–99)
Potassium: 4 mmol/L (ref 3.5–5.1)
Sodium: 137 mmol/L (ref 135–145)

## 2021-10-13 LAB — GLUCOSE, CAPILLARY
Glucose-Capillary: 108 mg/dL — ABNORMAL HIGH (ref 70–99)
Glucose-Capillary: 100 mg/dL — ABNORMAL HIGH (ref 70–99)
Glucose-Capillary: 127 mg/dL — ABNORMAL HIGH (ref 70–99)
Glucose-Capillary: 138 mg/dL — ABNORMAL HIGH (ref 70–99)

## 2021-10-13 LAB — CBC
HCT: 37.7 % — ABNORMAL LOW (ref 39.0–52.0)
Hemoglobin: 12.5 g/dL — ABNORMAL LOW (ref 13.0–17.0)
MCH: 30.7 pg (ref 26.0–34.0)
MCHC: 33.2 g/dL (ref 30.0–36.0)
MCV: 92.6 fL (ref 80.0–100.0)
Platelets: 255 10*3/uL (ref 150–400)
RBC: 4.07 MIL/uL — ABNORMAL LOW (ref 4.22–5.81)
RDW: 14 % (ref 11.5–15.5)
WBC: 14.1 10*3/uL — ABNORMAL HIGH (ref 4.0–10.5)
nRBC: 0 % (ref 0.0–0.2)

## 2021-10-13 LAB — MAGNESIUM: Magnesium: 2 mg/dL (ref 1.7–2.4)

## 2021-10-13 SURGERY — CORONARY STENT INTERVENTION
Anesthesia: LOCAL

## 2021-10-13 MED ORDER — POTASSIUM CHLORIDE CRYS ER 20 MEQ PO TBCR
40.0000 meq | EXTENDED_RELEASE_TABLET | Freq: Once | ORAL | Status: AC
  Administered 2021-10-13: 40 meq via ORAL
  Filled 2021-10-13: qty 2

## 2021-10-13 MED ORDER — FUROSEMIDE 40 MG PO TABS
40.0000 mg | ORAL_TABLET | Freq: Two times a day (BID) | ORAL | Status: DC
  Administered 2021-10-13 – 2021-10-16 (×8): 40 mg via ORAL
  Filled 2021-10-13 (×9): qty 1

## 2021-10-13 NOTE — Evaluation (Signed)
Physical Therapy Evaluation Patient Details Name: Nicholas Finley MRN: [id number] DOB: 02/17/1926 Today's Date: 10/13/2021  History of Present Illness  87 y.o. M with PMH of Allergy, Cataract, Chest pain (10/09/2021), Enlarged prostate, Heart murmur, Hypercholesteremia, Hypertension, and Sepsis with acute hypoxic respiratory failure, who presented with worsening chest pain and shortness of breath, gradually worsening dyspnea over several weeks.  Clinical Impression  Pt presents to PT with deficits in strength, power, endurance, balance, gait. Pt is able to ambulate, transfer, and negotiate steps without physical assistance at this time. Pt reports generalized weakness due to immobility during admission. Pt will benefit from continued aggressive mobilization to improve activity tolerance and to restore independence. Pt expresses no desire to pursue ALF placement, preferring to look into hiring care in the home. PT recommends discharge home with HHPT at this time. Pt will benefit from assistance from friend initially, with transition to PRN hired care.       Recommendations for follow up therapy are one component of a multi-disciplinary discharge planning process, led by the attending physician.  Recommendations may be updated based on patient status, additional functional criteria and insurance authorization.  Follow Up Recommendations Home health PT;Supervision - Intermittent    Equipment Recommendations  None recommended by PT    Recommendations for Other Services       Precautions / Restrictions Precautions Precautions: Fall Precaution Comments: watch HR Restrictions Weight Bearing Restrictions: No      Mobility  Bed Mobility Overal bed mobility: Needs Assistance Bed Mobility: Supine to Sit;Sit to Supine     Supine to sit: Supervision Sit to supine: Modified independent (Device/Increase time)   General bed mobility comments: increased difficulty with sitting up and scooting  to EOB    Transfers Overall transfer level: Needs assistance Equipment used: Straight cane Transfers: Sit to/from [employer] Transfers Sit to Stand: Min guard Stand pivot transfers: Min guard          Ambulation/Gait Ambulation/Gait assistance: Supervision Gait Distance (Feet): 400 Feet Assistive device: Straight cane Gait Pattern/deviations: Step-through pattern Gait velocity: reduced Gait velocity interpretation: <1.8 ft/sec, indicate of risk for recurrent falls General Gait Details: pt with slowed step-through gait, reduced stride length initially which improves with continued ambulation  Stairs Stairs: Yes Stairs assistance: Min guard Stair Management: One rail Left;Step to pattern Number of Stairs: 4    Wheelchair Mobility    Modified Rankin (Stroke Patients Only)       Balance Overall balance assessment: Needs assistance Sitting-balance support: Feet supported Sitting balance-Leahy Scale: Good     Standing balance support: Single extremity supported Standing balance-Leahy Scale: Poor Standing balance comment: needing cane and min guard... unsteady with shuffling steps and WBOS                             Pertinent Vitals/Pain Pain Assessment: No/denies pain    Home Living Family/patient expects to be discharged to:: Private residence Living Arrangements: Alone Available Help at Discharge: Family;Available PRN/intermittently Type of Home: House Home Access: Stairs to enter Entrance Stairs-Rails: Can reach both Entrance Stairs-Number of Steps: s sets of stairs from basement garage:  first set of 8 without HR to cement landing, then 2nd set of stairs with HR's on both sides.  Patient lives on a sloped lot. Home Layout: Laundry or work area in basement;Able to live on main level with bedroom/bathroom Home Equipment: Kasandra Knudsen - single point      Prior Function Level of Independence:  Independent with assistive device(s)          Comments: Walks with a cane.  Drives locally, completes all his onw meal prep and cleaning/home management.  Completes all meds and bill payment.  Perfroms sink baths standing.     Hand Dominance   Dominant Hand: Right    Extremity/Trunk Assessment   Upper Extremity Assessment Upper Extremity Assessment: Overall WFL for tasks assessed    Lower Extremity Assessment Lower Extremity Assessment: Defer to PT evaluation    Cervical / Trunk Assessment Cervical / Trunk Assessment: Kyphotic  Communication   Communication: No difficulties  Cognition Arousal/Alertness: Awake/alert Behavior During Therapy: WFL for tasks assessed/performed Overall Cognitive Status: Within Functional Limits for tasks assessed                                        General Comments General comments (skin integrity, edema, etc.): tachy into 120s with mobility, SpO2 stable, pt denies SOB    Exercises     Assessment/Plan    PT Assessment Patient needs continued PT services  PT Problem List Decreased strength;Decreased activity tolerance;Decreased balance;Decreased mobility;Cardiopulmonary status limiting activity       PT Treatment Interventions DME instruction;Gait training;Stair training;Functional mobility training;Therapeutic activities;Therapeutic exercise;Balance training;Neuromuscular re-education;Patient/family education    PT Goals (Current goals can be found in the Care Plan section)  Acute Rehab PT Goals Patient Stated Goal: I'm going home PT Goal Formulation: With patient Time For Goal Achievement: 11/13/2021 Potential to Achieve Goals: Fair Additional Goals Additional Goal #1: Pt will score >19/24 on DGI to indicate a reduced risk for falls    Frequency Min 3X/week   Barriers to discharge        Co-evaluation               AM-PAC PT "6 Clicks" Mobility  Outcome Measure Help needed turning from your back to your side while in a flat bed without using  bedrails?: None Help needed moving from lying on your back to sitting on the side of a flat bed without using bedrails?: None Help needed moving to and from a bed to a chair (including a wheelchair)?: A Little Help needed standing up from a chair using your arms (e.g., wheelchair or bedside chair)?: A Little Help needed to walk in hospital room?: A Little Help needed climbing 3-5 steps with a railing? : A Little 6 Click Score: 20    End of Session   Activity Tolerance: Patient tolerated treatment well Patient left: in bed;with call bell/phone within reach Nurse Communication: Mobility status PT Visit Diagnosis: Other abnormalities of gait and mobility (R26.89);Muscle weakness (generalized) (M62.81)    Time: 5366-4403 PT Time Calculation (min) (ACUTE ONLY): 46 min   Charges:   PT Evaluation $PT Eval Low Complexity: [place], PT, DPT Acute Rehabilitation Pager: [phone] Office [phone]   Zenaida Niece 10/13/2021, 5:51 PM

## 2021-10-13 NOTE — Plan of Care (Signed)

## 2021-10-13 NOTE — Evaluation (Signed)
Occupational Therapy Evaluation Patient Details Name: Nicholas Finley MRN: [id number] DOB: 13-Feb-1926 Today's Date: 10/13/2021   History of Present Illness 87 y.o. M with PMH of Allergy, Cataract, Chest pain (10/09/2021), Enlarged prostate, Heart murmur, Hypercholesteremia, Hypertension, and Sepsis with acute hypoxic respiratory failure, who presented with worsening chest pain and shortness of breath, gradually worsening dyspnea over several weeks.   Clinical Impression   Patient admitted for the above diagnosis.  PTA he lives alone and was able to care for himself without support.  He walks with a cane, and only completed sink baths, but otherwise cared for his home, meds, bills, and meals.  He continues to drive locally.  Primary deficits are generalized weakness and unsteadiness.   The patient is not considering ALF post acute, and plans on returning home.  He is open to [facility] OT post acute for a few weeks, and would benefit from increased oversight, visits from local family.  OT will follow in the acute setting.      Recommendations for follow up therapy are one component of a multi-disciplinary discharge planning process, led by the attending physician.  Recommendations may be updated based on patient status, additional functional criteria and insurance authorization.   Follow Up Recommendations  Home health OT;Supervision - Intermittent    Equipment Recommendations  None recommended by OT    Recommendations for Other Services       Precautions / Restrictions Precautions Precautions: Fall Precaution Comments: watch HR Restrictions Weight Bearing Restrictions: No      Mobility Bed Mobility Overal bed mobility: Needs Assistance Bed Mobility: Supine to Sit;Sit to Supine     Supine to sit: Supervision Sit to supine: Modified independent (Device/Increase time)   General bed mobility comments: increased difficulty with sitting up and scooting to EOB    Transfers Overall  transfer level: Needs assistance Equipment used: Straight cane Transfers: Sit to/from Stand;Stand Pivot Transfers Sit to Stand: Min guard Stand pivot transfers: Min guard            Balance Overall balance assessment: Needs assistance Sitting-balance support: Feet supported Sitting balance-Leahy Scale: Good     Standing balance support: Single extremity supported Standing balance-Leahy Scale: Poor Standing balance comment: needing cane and min guard... unsteady with shuffling steps and WBOS                           ADL either performed or assessed with clinical judgement   ADL       Grooming: Wash/dry hands;Standing;Supervision/safety       Lower Body Bathing: Min guard;Sit to/from stand       Lower Body Dressing: Min guard;Sit to/from stand   Toilet Transfer: Nature conservation officer;Ambulation   Toileting- Clothing Manipulation and Hygiene: Supervision/safety;Sit to/from stand               Vision Baseline Vision/History: 1 Wears glasses Patient Visual Report: No change from baseline       Perception     Praxis      Pertinent Vitals/Pain Pain Assessment: No/denies pain     Hand Dominance Right   Extremity/Trunk Assessment Upper Extremity Assessment Upper Extremity Assessment: Overall WFL for tasks assessed   Lower Extremity Assessment Lower Extremity Assessment: Defer to PT evaluation   Cervical / Trunk Assessment Cervical / Trunk Assessment: Kyphotic   Communication Communication Communication: No difficulties   Cognition Arousal/Alertness: Awake/alert Behavior During Therapy: WFL for tasks assessed/performed Overall Cognitive Status: Within Functional Limits for tasks  assessed                                     General Comments   HR to 103 with mobility.    Exercises     Shoulder Instructions      Home Living Family/patient expects to be discharged to:: Private residence Living Arrangements:  Alone Available Help at Discharge: Family;Available PRN/intermittently Type of Home: House Home Access: Stairs to enter [employer] of Steps: s sets of stairs from basement garage:  first set of 8 without HR to cement landing, then 2nd set of stairs with HR's on both sides.  Patient lives on a sloped lot.   Home Layout: Laundry or work area in basement;Able to live on main level with bedroom/bathroom     Bathroom Shower/Tub: Teacher, early years/pre: Standard Bathroom Accessibility: Yes How Accessible: Accessible via walker Home Equipment: Kasandra Knudsen - single point          Prior Functioning/Environment Level of Independence: Independent with assistive device(s)        Comments: Walks with a cane.  Drives locally, completes all his onw meal prep and cleaning/home management.  Completes all meds and bill payment.  Perfroms sink baths standing.        OT Problem List: Decreased strength;Impaired balance (sitting and/or standing)      OT Treatment/Interventions: Self-care/ADL training;Therapeutic exercise;Therapeutic activities;Balance training;Patient/family education    OT Goals(Current goals can be found in the care plan section) Acute Rehab OT Goals Patient Stated Goal: I'm going home OT Goal Formulation: With patient Time For Goal Achievement: 11/06/2021 Potential to Achieve Goals: Good ADL Goals Pt Will Perform Grooming: with modified independence;standing Pt Will Perform Lower Body Bathing: with modified independence;sit to/from stand Pt Will Perform Lower Body Dressing: with modified independence;sit to/from stand Pt Will Transfer to Toilet: with modified independence;regular height toilet;ambulating Pt Will Perform Toileting - Clothing Manipulation and hygiene: with modified independence;sit to/from stand Pt/caregiver will Perform Home Exercise Program: Increased strength;Both right and left upper extremity;With theraband;With written HEP provided  OT  Frequency: Min 2X/week   Barriers to D/C:    none noted       Co-evaluation              AM-PAC OT "6 Clicks" Daily Activity     Outcome Measure Help from another person eating meals?: None Help from another person taking care of personal grooming?: A Little Help from another person toileting, which includes using toliet, bedpan, or urinal?: A Little Help from another person bathing (including washing, rinsing, drying)?: A Little Help from another person to put on and taking off regular upper body clothing?: None Help from another person to put on and taking off regular lower body clothing?: A Little 6 Click Score: 20   End of Session Equipment Utilized During Treatment: Gait belt Nurse Communication: Mobility status  Activity Tolerance: Patient tolerated treatment well Patient left: in bed;with call bell/phone within reach;with bed alarm set  OT Visit Diagnosis: Unsteadiness on feet (R26.81)                Time: 3254-9826 OT Time Calculation (min): 25 min Charges:  OT General Charges $OT Visit: 1 Visit OT Evaluation $OT Eval Moderate Complexity: 1 Mod OT Treatments $Self Care/Home Management : 8-22 mins  10/13/2021  RP, OTR/L  Acute Rehabilitation Services  Office:  [phone]   Metta Clines 10/13/2021,  4:03 PM

## 2021-10-13 NOTE — Progress Notes (Signed)
Progress Note  Patient Name: Nicholas Finley Date of Encounter: 10/13/2021  [facility] Cardiologist: None Nicholas Finley  Subjective   Feels better.  Breathing well.   Inpatient Medications    Scheduled Meds:  amoxicillin-clavulanate  1 tablet Oral Q12H   aspirin EC  81 mg Oral Daily   atorvastatin  40 mg Oral Daily   Chlorhexidine Gluconate Cloth  6 each Topical Daily   clopidogrel  75 mg Oral Daily   enoxaparin (LOVENOX) injection  40 mg Subcutaneous Q24H   furosemide  40 mg Intravenous Q12H   sodium chloride flush  3 mL Intravenous Q12H   sodium chloride flush  3 mL Intravenous Q12H   Continuous Infusions:  PRN Meds: docusate sodium, nitroGLYCERIN, sodium chloride flush   Vital Signs    Vitals:   10/13/21 0300 10/13/21 0400 10/13/21 0500 10/13/21 0600  BP:      Pulse:      Resp: 20 20 (!) 22 16  Temp:      TempSrc:      SpO2:      Weight:   61.9 kg     Intake/Output Summary (Last 24 hours) at 10/13/2021 0815 Last data filed at 10/12/2021 1800 Gross per 24 hour  Intake 410.51 ml  Output 675 ml  Net -264.49 ml   Last 3 Weights 10/13/2021 10/12/2021 10/09/2021  Weight (lbs) 136 lb 7.4 oz 140 lb 3.4 oz 149 lb  Weight (kg) 61.9 kg 63.6 kg 67.586 kg      Telemetry    Sinus tach - Personally Reviewed  ECG      Physical Exam   GEN: No acute distress.   Neck: No JVD Cardiac: RRR, no murmurs, rubs, or gallops.  Respiratory: Clear to auscultation bilaterally. GI: Soft, nontender, non-distended  MS: No edema; No deformity. Neuro:  Nonfocal  Psych: Normal affect   Labs    High Sensitivity Troponin:   Recent Labs  Lab 10/09/21 1710 10/09/21 1921 10/10/21 0500 10/11/21 0601  TROPONINIHS 155* 1,071* 20,610* 7,652*     Chemistry Recent Labs  Lab 10/09/21 1710 10/09/21 1720 10/10/21 0130 10/10/21 0408 10/12/21 0426 10/12/21 1111 10/12/21 1641  NA 139   < > 136   < > 137 134* 136  K 4.8   < > 4.1   < > 3.5 3.7 4.6  CL 107  --  108    < > 101 102 103  CO2 15*  --  18*   < > 22 25 23   GLUCOSE 295*  --  153*   < > 110* 109* 99  BUN 23  --  28*   < > 34* 33* 35*  CREATININE 1.47*  --  0.93   < > 0.82 0.87 0.88  CALCIUM 8.8*  --  8.3*   < > 8.4* 8.7* 9.0  MG  --   --  2.2  --   --   --   --   PROT 6.9  --   --   --   --   --   --   ALBUMIN 3.3*  --   --   --   --   --   --   AST 114*  --   --   --   --   --   --   ALT 66*  --   --   --   --   --   --   ALKPHOS 70  --   --   --   --   --   --  BILITOT 0.9  --   --   --   --   --   --   GFRNONAA 44*  --  >60   < > >60 >60 >60  ANIONGAP 17*  --  10   < > 14 7 10    < > = values in this interval not displayed.    Lipids  Recent Labs  Lab 10/11/21 0746  CHOL 128  TRIG 83  HDL 56  LDLCALC 55  CHOLHDL 2.3    Hematology Recent Labs  Lab 10/11/21 0601 10/11/21 1112 10/11/21 1116 10/12/21 0426 10/13/21 0409  WBC 19.9*  --   --  18.2* 14.1*  RBC 4.03*  --   --  3.97* 4.07*  HGB 12.7*   < > 13.3 12.2* 12.5*  HCT 37.8*   < > 39.0 37.0* 37.7*  MCV 93.8  --   --  93.2 92.6  MCH 31.5  --   --  30.7 30.7  MCHC 33.6  --   --  33.0 33.2  RDW 14.3  --   --  14.1 14.0  PLT 233  --   --  233 255   < > = values in this interval not displayed.   Thyroid No results for input(s): TSH, FREET4 in the last 168 hours.  BNP Recent Labs  Lab 10/09/21 1710 10/10/21 1410 10/11/21 0601  BNP 625.3* 1,802.6* 945.1*    DDimer No results for input(s): DDIMER in the last 168 hours.   Radiology    CARDIAC CATHETERIZATION  Result Date: 10/11/2021   Ost LM lesion is 95% stenosed.   Prox LAD to Mid LAD lesion is 70% stenosed.   Ost Cx to Prox Cx lesion is 80% stenosed.   LV end diastolic pressure is mildly elevated.   Hemodynamic findings consistent with mild pulmonary hypertension.   There is severe aortic valve stenosis. Critical ostial left  main stenosis - heavily calcified Moderate proximal LAD and LCx stenosis also heavily calcified Severe aortic stenosis. Mean gradient 28 mm  Hg with AVA 0.72 cm squared, index 0.41 Elevated LV filling pressures - mean PCWP 24 mm Hg Mild pulmonary HTN. Mean PAP 33 mm Hg Low cardiac output 3.49 L/min with index 1.97. Plan: patient has very high risk anatomy with critical ostial Left main stenosis and severe AS. Will continue IV diuresis. Add inotropic support with IV milrinone. Will need heart team approach to review possible treatment options.   DG Chest Port 1 View  Result Date: 10/12/2021 CLINICAL DATA:  Pulmonary edema ,confusion ,hx resp distress EXAM: PORTABLE CHEST - 1 VIEW COMPARISON:  the previous day's study FINDINGS: Improving perihilar opacities with only mild interstitial residual. Some improvement in the left retrocardiac consolidation. Patchy airspace disease in the right infrahilar region has slightly increased. Heart size within normal limits. Aortic Atherosclerosis (ICD10-170.0). No pneumothorax.  Possible small pleural effusions. Visualized bones unremarkable. IMPRESSION: Partial improvement in asymmetric pulmonary edema/infiltrates. Suspect small pleural effusions. Electronically Signed   By: Lucrezia Europe M.D.   On: 10/12/2021 06:38    Cardiac Studies   Cath showed severe ostial left main disease, proximal circumflex disease and proximal to mid LAD disease  Patient Profile     87 y.o. male with non-STEMI, CAD, aortic stenosis  Assessment & Plan    CAD/aortic stenosis/non-STEMI: Appreciate palliative care input.  He has high risk coronary anatomy.  Not a candidate for bypass surgery.  He would be at high risk for periprocedural complication and death if  percutaneous intervention was attempted.  I agree with the decision for medical therapy and palliative care.  Continue Plavix and statin.  Acute systolic heart failure: Ejection fraction in the 40% range.  He diuresed well while on milrinone.  Milrinone was stopped.  Can continue IV Lasix and switch to p.o. when more stable.  Was on lisinopril at home for hypertension.   Will add back as needed.  BP controlled.  Want to avoid hypotension in the setting of aortic stenosis.  He again reaffirms his decision for palliative care.      For questions or updates, please contact [place] Please consult [email or web address] for contact info under        Signed, Larae Grooms, MD  10/13/2021, 8:15 AM

## 2021-10-13 NOTE — Progress Notes (Signed)
PROGRESS NOTE    Nicholas Finley  RCV:[id number] DOB: 06-29-1926 DOA: 10/09/2021 PCP: [name], MD   Brief Narrative: 87 year old with past medical history significant for hypertension, HLD, who presented with worsening chest pain and shortness of breath over the last 2 days prior to admission.  He also reported worsening dyspnea over several weeks.  On EMS arrival patient was found hypoxic oxygen saturation in the 60 and EKG with concern for ST depression in the inferior and lateral leads.  Patient was admitted with non-STEMI, he was also placed on BiPAP with some improvement of his respiratory distress, he was a started on nitroglycerin drip, subsequently nitroglycerin is stopped due to low blood pressure.  Patient was also started on a heparin drip.  Patient underwent heart cath which showed critical ostial left main stenosis which was heavily calcified, moderate proximal LAD and circumflex stenosis.  Severe aortic stenosis.  Patient does not wish to proceed with high risk procedure.  He was made DNR.  Palliative care has been consulted for further goals of care and help with disposition.   Assessment & Plan:   Active Problems:   Chest pain   Sepsis with acute hypoxic respiratory failure and septic shock (Nicholas Finley)   Community acquired pneumonia   Respiratory failure (Nicholas Finley)   Pulmonary edema   Nonrheumatic aortic valve stenosis   1-Acute hypoxic respiratory failure secondary to diffuse pulmonary edema and or pneumonia: Patient improved with BiPAP.  Currently off of BiPAP. He received IV lasix.  Plan to complete  oral course of antibiotics with Augmentin.  -Order Lasix for  2-NSTEMI, Acute systolic heart failure exacerbation, Aortic stenosis  -Cath: which showed critical ostial left main stenosis which was heavily calcified, moderate proximal LAD and circumflex stenosis.  Severe aortic stenosis. -Patient now wishes to proceed to high risk procedure -No management.  He received  IV heparin drip, Plavix, statins.  Leukocytosis: Related to aspiration versus reactive: On antibiotics follow trend.  Hypokalemia: Replete  orally. Transaminases: Likely hepatic congestion from heart failure    Estimated body mass index is 22.36 kg/m as calculated from the following:   Height as of 09/07/21: 5' 5.5" (1.664 m).   Weight as of this encounter: 61.9 kg.   DVT prophylaxis: Lovenox Code Status: DNR Family Communication: Disposition Plan:  Status is: Inpatient  Remains inpatient appropriate because: Admitted with non-STEMI, medication titration, palliative care evaluation and PT eval        Consultants:  Cardiology   Procedures:  Cath  Antimicrobials:  Augmentin   Subjective: He is alert and conversant, he denies chest pain.  He lives at home alone.  Objective: Vitals:   10/13/21 0400 10/13/21 0500 10/13/21 0600 10/13/21 0700  BP:      Pulse:      Resp: 20 (!) 22 16   Temp:    98.6 F (37 C)  TempSrc:      SpO2:      Weight:  61.9 kg      Intake/Output Summary (Last 24 hours) at 10/13/2021 0908 Last data filed at 10/12/2021 1800 Gross per 24 hour  Intake 410.51 ml  Output 675 ml  Net -264.49 ml   Filed Weights   10/09/21 2105 10/12/21 0600 10/13/21 0500  Weight: 67.6 kg 63.6 kg 61.9 kg    Examination:  General exam: Appears calm and comfortable  Respiratory system: Clear to auscultation. Respiratory effort normal. Cardiovascular system: S1 & S2 heard, RRR.  Gastrointestinal system: Abdomen is nondistended, soft and nontender. No  organomegaly or masses felt. Normal bowel sounds heard. Central nervous system: Alert and oriented.  Extremities: Symmetric 5 x 5 power.    Data Reviewed: I have personally reviewed following labs and imaging studies  CBC: Recent Labs  Lab 10/09/21 1710 10/09/21 1720 10/10/21 0130 10/10/21 0408 10/11/21 0601 10/11/21 1112 10/11/21 1115 10/11/21 1116 10/12/21 0426 10/13/21 0409  WBC 21.1*  --   29.5*  --  19.9*  --   --   --  18.2* 14.1*  NEUTROABS 8.8*  --   --   --   --   --   --   --   --   --   HGB 14.0   < > 14.2   < > 12.7* 13.3 13.3 13.3 12.2* 12.5*  HCT 46.0   < > 43.4   < > 37.8* 39.0 39.0 39.0 37.0* 37.7*  MCV 102.9*  --  95.8  --  93.8  --   --   --  93.2 92.6  PLT 318  --  265  --  233  --   --   --  233 255   < > = values in this interval not displayed.   Basic Metabolic Panel: Recent Labs  Lab 10/10/21 0130 10/10/21 0408 10/11/21 1649 10/12/21 0426 10/12/21 1111 10/12/21 1641 10/13/21 0409  NA 136   < > 138 137 134* 136 137  K 4.1   < > 3.2* 3.5 3.7 4.6 4.0  CL 108   < > 102 101 102 103 102  CO2 18*   < > 26 22 25 23 24   GLUCOSE 153*   < > 156* 110* 109* 99 106*  BUN 28*   < > 31* 34* 33* 35* 34*  CREATININE 0.93   < > 0.95 0.82 0.87 0.88 0.94  CALCIUM 8.3*   < > 8.7* 8.4* 8.7* 9.0 8.9  MG 2.2  --   --   --   --   --  2.0  PHOS 4.4  --   --   --   --   --   --    < > = values in this interval not displayed.   GFR: Estimated Creatinine Clearance: 43 mL/min (by C-G formula based on SCr of 0.94 mg/dL). Liver Function Tests: Recent Labs  Lab 10/09/21 1710  AST 114*  ALT 66*  ALKPHOS 70  BILITOT 0.9  PROT 6.9  ALBUMIN 3.3*   No results for input(s): LIPASE, AMYLASE in the last 168 hours. No results for input(s): AMMONIA in the last 168 hours. Coagulation Profile: No results for input(s): INR, PROTIME in the last 168 hours. Cardiac Enzymes: No results for input(s): CKTOTAL, CKMB, CKMBINDEX, TROPONINI in the last 168 hours. BNP (last 3 results) No results for input(s): PROBNP in the last 8760 hours. HbA1C: Recent Labs    10/11/21 0936  HGBA1C 5.8*   CBG: Recent Labs  Lab 10/12/21 1601 10/12/21 1957 10/12/21 2322 10/13/21 0321 10/13/21 0646  GLUCAP 99 119* 106* 108* 100*   Lipid Profile: Recent Labs    10/11/21 0746  CHOL 128  HDL 56  LDLCALC 55  TRIG 83  CHOLHDL 2.3   Thyroid Function Tests: No results for input(s): TSH,  T4TOTAL, FREET4, T3FREE, THYROIDAB in the last 72 hours. Anemia Panel: No results for input(s): VITAMINB12, FOLATE, FERRITIN, TIBC, IRON, RETICCTPCT in the last 72 hours. Sepsis Labs: Recent Labs  Lab 10/09/21 1710 10/09/21 1921 10/10/21 1051 10/10/21 1410 10/11/21 0601 10/12/21 5643  PROCALCITON  --   --   --  4.07 2.76 1.67  LATICACIDVEN 8.5* 3.4* 2.2* 1.7  --   --     Recent Results (from the past 240 hour(s))  Blood culture (routine x 2)     Status: None (Preliminary result)   Collection Time: 10/09/21  5:10 PM   Specimen: BLOOD  Result Value Ref Range Status   Specimen Description BLOOD SITE NOT SPECIFIED  Final   Special Requests   Final    BOTTLES DRAWN AEROBIC ONLY Blood Culture results may not be optimal due to an inadequate volume of blood received in culture bottles   Culture   Final    NO GROWTH 3 DAYS Performed at [facility], [employer] [address], [place], [place] [address]    Report Status PENDING  Incomplete  Resp Panel by RT-PCR (Flu A&B, Covid) Nasopharyngeal Swab     Status: None   Collection Time: 10/09/21  7:09 PM   Specimen: Nasopharyngeal Swab; Nasopharyngeal(NP) swabs in vial transport medium  Result Value Ref Range Status   SARS Coronavirus 2 by RT PCR NEGATIVE NEGATIVE Final    Comment: (NOTE) SARS-CoV-2 target nucleic acids are NOT DETECTED.  The SARS-CoV-2 RNA is generally detectable in upper respiratory specimens during the acute phase of infection. The lowest concentration of SARS-CoV-2 viral copies this assay can detect is 138 copies/mL. A negative result does not preclude SARS-Cov-2 infection and should not be used as the sole basis for treatment or other patient management decisions. A negative result may occur with  improper specimen collection/handling, submission of specimen other than nasopharyngeal swab, presence of viral mutation(s) within the areas targeted by this assay, and inadequate number of viral copies(<138 copies/mL). A  negative result must be combined with clinical observations, patient history, and epidemiological information. The expected result is Negative.  Fact Sheet for Patients:  EntrepreneurPulse.com.au  Fact Sheet for Healthcare Providers:  IncredibleEmployment.be  This test is no t yet approved or cleared by the [place] FDA and  has been authorized for detection and/or diagnosis of SARS-CoV-2 by FDA under an Emergency Use Authorization (EUA). This EUA will remain  in effect (meaning this test can be used) for the duration of the COVID-19 declaration under Section 564(b)(1) of the Act, 21 U.S.C.section 360bbb-3(b)(1), unless the authorization is terminated  or revoked sooner.       Influenza A by PCR NEGATIVE NEGATIVE Final   Influenza B by PCR NEGATIVE NEGATIVE Final    Comment: (NOTE) The Xpert Xpress SARS-CoV-2/FLU/RSV plus assay is intended as an aid in the diagnosis of influenza from Nasopharyngeal swab specimens and should not be used as a sole basis for treatment. Nasal washings and aspirates are unacceptable for Xpert Xpress SARS-CoV-2/FLU/RSV testing.  Fact Sheet for Patients: EntrepreneurPulse.com.au  Fact Sheet for Healthcare Providers: IncredibleEmployment.be  This test is not yet approved or cleared by the [place] FDA and has been authorized for detection and/or diagnosis of SARS-CoV-2 by FDA under an Emergency Use Authorization (EUA). This EUA will remain in effect (meaning this test can be used) for the duration of the COVID-19 declaration under Section 564(b)(1) of the Act, 21 U.S.C. section 360bbb-3(b)(1), unless the authorization is terminated or revoked.  Performed at [facility], [place] [address], [place], [place] [address]   Blood culture (routine x 2)     Status: None (Preliminary result)   Collection Time: 10/09/21  9:25 PM   Specimen: BLOOD  Result Value Ref Range  Status   Specimen Description BLOOD RIGHT ANTECUBITAL  Final   Special Requests   Final    BOTTLES DRAWN AEROBIC AND ANAEROBIC Blood Culture adequate volume   Culture   Final    NO GROWTH 3 DAYS Performed at [facility], [address] [place], [place] [address]    Report Status PENDING  Incomplete  MRSA Next Gen by PCR, Nasal     Status: None   Collection Time: 10/10/21  5:27 AM   Specimen: Nasal Mucosa; Nasal Swab  Result Value Ref Range Status   MRSA by PCR Next Gen NOT DETECTED NOT DETECTED Final    Comment: (NOTE) The GeneXpert MRSA Assay (FDA approved for NASAL specimens only), is one component of a comprehensive MRSA colonization surveillance program. It is not intended to diagnose MRSA infection nor to guide or monitor treatment for MRSA infections. Test performance is not FDA approved in patients less than 45 years old. Performed at [facility], [place] [address] [place], [place] [address]   Respiratory (~20 pathogens) panel by PCR     Status: None   Collection Time: 10/10/21  5:27 AM   Specimen: Nasopharyngeal Swab; Respiratory  Result Value Ref Range Status   Adenovirus NOT DETECTED NOT DETECTED Final   Coronavirus 229E NOT DETECTED NOT DETECTED Final    Comment: (NOTE) The Coronavirus on the Respiratory Panel, DOES NOT test for the novel  Coronavirus (2019 nCoV)    Coronavirus HKU1 NOT DETECTED NOT DETECTED Final   Coronavirus NL63 NOT DETECTED NOT DETECTED Final   Coronavirus OC43 NOT DETECTED NOT DETECTED Final   Metapneumovirus NOT DETECTED NOT DETECTED Final   Rhinovirus / Enterovirus NOT DETECTED NOT DETECTED Final   Influenza A NOT DETECTED NOT DETECTED Final   Influenza B NOT DETECTED NOT DETECTED Final   Parainfluenza Virus 1 NOT DETECTED NOT DETECTED Final   Parainfluenza Virus 2 NOT DETECTED NOT DETECTED Final   Parainfluenza Virus 3 NOT DETECTED NOT DETECTED Final   Parainfluenza Virus 4 NOT DETECTED NOT DETECTED Final   Respiratory  Syncytial Virus NOT DETECTED NOT DETECTED Final   Bordetella pertussis NOT DETECTED NOT DETECTED Final   Bordetella Parapertussis NOT DETECTED NOT DETECTED Final   Chlamydophila pneumoniae NOT DETECTED NOT DETECTED Final   Mycoplasma pneumoniae NOT DETECTED NOT DETECTED Final    Comment: Performed at [facility], [place]. [address] [place], [place] [address]         Radiology Studies: CARDIAC CATHETERIZATION  Result Date: 10/11/2021   Ost LM lesion is 95% stenosed.   Prox LAD to Mid LAD lesion is 70% stenosed.   Ost Cx to Prox Cx lesion is 80% stenosed.   LV end diastolic pressure is mildly elevated.   Hemodynamic findings consistent with mild pulmonary hypertension.   There is severe aortic valve stenosis. Critical ostial left  main stenosis - heavily calcified Moderate proximal LAD and LCx stenosis also heavily calcified Severe aortic stenosis. Mean gradient 28 mm Hg with AVA 0.72 cm squared, index 0.41 Elevated LV filling pressures - mean PCWP 24 mm Hg Mild pulmonary HTN. Mean PAP 33 mm Hg Low cardiac output 3.49 L/min with index 1.97. Plan: patient has very high risk anatomy with critical ostial Left main stenosis and severe AS. Will continue IV diuresis. Add inotropic support with IV milrinone. Will need heart team approach to review possible treatment options.   DG Chest Port 1 View  Result Date: 10/12/2021 CLINICAL DATA:  Pulmonary edema ,  confusion ,hx resp distress EXAM: PORTABLE CHEST - 1 VIEW COMPARISON:  the previous day's study FINDINGS: Improving perihilar opacities with only mild interstitial residual. Some improvement in the left retrocardiac consolidation. Patchy airspace disease in the right infrahilar region has slightly increased. Heart size within normal limits. Aortic Atherosclerosis (ICD10-170.0). No pneumothorax.  Possible small pleural effusions. Visualized bones unremarkable. IMPRESSION: Partial improvement in asymmetric pulmonary edema/infiltrates. Suspect small  pleural effusions. Electronically Signed   By: Lucrezia Europe M.D.   On: 10/12/2021 06:38        Scheduled Meds:  amoxicillin-clavulanate  1 tablet Oral Q12H   aspirin EC  81 mg Oral Daily   atorvastatin  40 mg Oral Daily   Chlorhexidine Gluconate Cloth  6 each Topical Daily   clopidogrel  75 mg Oral Daily   enoxaparin (LOVENOX) injection  40 mg Subcutaneous Q24H   furosemide  40 mg Oral BID   potassium chloride  40 mEq Oral Once   sodium chloride flush  3 mL Intravenous Q12H   sodium chloride flush  3 mL Intravenous Q12H   Continuous Infusions:   LOS: 4 days    Time spent: 35 minutes    Starlee Corralejo A Yussef Jorge, MD [facility]   If 7PM-7AM, please contact night-coverage [email or web address]  10/13/2021, 9:08 AM

## 2021-10-13 NOTE — Consult Note (Signed)
Consultation Note Date: 10/13/2021   Patient Name: Nicholas Finley  DOB: 1926-08-02  MRN: [id number]  Age / Sex: 87 y.o., male  PCP: [name], MD Referring Physician: Elmarie Shiley, MD  Reason for Consultation: Establishing goals of care  HPI/Patient Profile: 87 y.o. male  with past medical history of HTN, HLD admitted on 10/09/2021 with chest pain and shortness of breath and found to have NSTEMI, severe aortic stenosis, EF 40%. He has opted for conservative medical management.   Clinical Assessment and Goals of Care: I met today with Nicholas Finley. Very nice gentleman who is resting comfortably in bed. He has some understanding of his condition which I further explained about the challenges moving forward with a condition expected to worsen and cause him more problems with time. He expresses understanding. He confirms desire for DNR and also confirms desire for no invasive work up or procedures - "I'm ready to go."   He talks of his desire to return home. We discussed concern for safety and if he were to fall or have chest pain and he says that he would just call for help like he did this time. He does acknowledge that he will be unlikely to drive anymore. Attempted to ask about his support and he has some cousins and a niece? Unclear how able/willing they are to assist Ms. Gwyndolyn Saxon. He does report that he uses cane and has had some dizziness in the past.   He shares with me more about his mother and especially his father. He talks of having to decided DNR for his father and how difficult this was for him although he knew it was best for his father. We discussed his wishes moving forward and he would desire to be rehospitalized but would not want invasive/aggressive measures but open to labs and to see if there are any medication adjustments that could be made to help him to feel better. We discussed  hospice and he recognizes this is something he will inevitably need in the future. We discuss feeding tubes and he talks about his father having PEG and he is unsure how he feels about this. I expressed that my concern is that feeding tubes can potentially prolong life when often there is very poor quality and I see how much he values his independence and I do not believe this is something that would align with his expressed goals.   All questions/concerns addressed. Emotional support provided.   Primary Decision Maker PATIENT    SUMMARY OF RECOMMENDATIONS   - DNR confirmed - Okay with rehospitalization but very clear NO desire for aggressive/invasive interventions/testing - He wishes to return to his home (therapy evaluation pending) - Plan to continue conversation and complete MOST tomorrow  Code Status/Advance Care Planning: DNR   Symptom Management:  Denies pain, chest pain, any discomfort  Palliative Prophylaxis:  Delirium Protocol and Frequent Pain Assessment  Prognosis:  Overall prognosis poor with severe aortic stenosis, advanced age, and focus on conservative management.   Discharge Planning: To  Be Determined      Primary Diagnoses: Present on Admission:  Respiratory failure ([place])   I have reviewed the medical record, interviewed the patient and family, and examined the patient. The following aspects are pertinent.  Past Medical History:  Diagnosis Date   Allergy    Cataract    Chest pain 10/09/2021   Enlarged prostate    Heart murmur    Hypercholesteremia    Hypertension    Sepsis with acute hypoxic respiratory failure and septic shock ([place]) 10/09/2021   Social History   Socioeconomic History   Marital status: Single    Spouse name: Not on file   Number of children: 0   Years of education: 16   Highest education level: Not on file  Occupational History    Comment: retired  Tobacco Use   Smoking status: Some Days    Types: Cigarettes, Pipe, Cigars    Smokeless tobacco: Never   Tobacco comments:    cigars   Vaping Use   Vaping Use: Never used  Substance and Sexual Activity   Alcohol use: Yes    Alcohol/week: 0.0 standard drinks    Comment: occas beer   Drug use: No   Sexual activity: Not on file  Other Topics Concern   Not on file  Social History Narrative   Patient is right handed, resides alone, consumes caffeine twice daily   Social Determinants of Health   Financial Resource Strain: Not on file  Food Insecurity: Not on file  Transportation Needs: Not on file  Physical Activity: Not on file  Stress: Not on file  Social Connections: Not on file   Family History  Problem Relation Age of Onset   Heart failure Father    Heart Problems Sister    Scheduled Meds:  amoxicillin-clavulanate  1 tablet Oral Q12H   aspirin EC  81 mg Oral Daily   atorvastatin  40 mg Oral Daily   Chlorhexidine Gluconate Cloth  6 each Topical Daily   clopidogrel  75 mg Oral Daily   enoxaparin (LOVENOX) injection  40 mg Subcutaneous Q24H   furosemide  40 mg Oral BID   sodium chloride flush  3 mL Intravenous Q12H   sodium chloride flush  3 mL Intravenous Q12H   Continuous Infusions: PRN Meds:.docusate sodium, nitroGLYCERIN, sodium chloride flush No Known Allergies Review of Systems  Constitutional:  Positive for activity change and fatigue.  Respiratory:  Negative for cough and shortness of breath.   Cardiovascular:  Negative for chest pain.  Neurological:  Positive for weakness.   Physical Exam Vitals and nursing note reviewed.  Cardiovascular:     Rate and Rhythm: Normal rate.  Pulmonary:     Effort: No tachypnea, accessory muscle usage or respiratory distress.  Abdominal:     General: Abdomen is flat.  Neurological:     Mental Status: He is alert and oriented to person, place, and time.    Vital Signs: BP 100/84   Pulse 96   Temp 98.2 F (36.8 C) (Oral)   Resp 19   Wt 61.9 kg   SpO2 97%   BMI 22.36 kg/m  Pain Scale:  0-10 POSS *See Group Information*: 1-Acceptable,Awake and alert Pain Score: 0-No pain   SpO2: SpO2: 97 % O2 Device:SpO2: 97 % O2 Flow Rate: .O2 Flow Rate (L/min): 3 L/min  IO: Intake/output summary:  Intake/Output Summary (Last 24 hours) at 10/13/2021 1526 Last data filed at 10/13/2021 1151 Gross per 24 hour  Intake 240 ml  Output 975 ml  Net -735 ml    LBM: Last BM Date: 10/12/21 Baseline Weight: Weight: 67.6 kg Most recent weight: Weight: 61.9 kg     Palliative Assessment/Data:    Time Total: 70 min  Greater than 50%  of this time was spent counseling and coordinating care related to the above assessment and plan.  Signed by: Vinie Sill, NP Palliative Medicine Team Pager # [phone] (M-F 8a-5p) Team Phone # [phone] (Nights/Weekends)

## 2021-10-14 DIAGNOSIS — I214 Non-ST elevation (NSTEMI) myocardial infarction: Secondary | ICD-10-CM | POA: Diagnosis not present

## 2021-10-14 DIAGNOSIS — I350 Nonrheumatic aortic (valve) stenosis: Secondary | ICD-10-CM | POA: Diagnosis not present

## 2021-10-14 DIAGNOSIS — Z515 Encounter for palliative care: Secondary | ICD-10-CM | POA: Diagnosis not present

## 2021-10-14 DIAGNOSIS — Z7189 Other specified counseling: Secondary | ICD-10-CM | POA: Diagnosis not present

## 2021-10-14 LAB — CBC
HCT: 39.4 % (ref 39.0–52.0)
Hemoglobin: 12.9 g/dL — ABNORMAL LOW (ref 13.0–17.0)
MCH: 30.9 pg (ref 26.0–34.0)
MCHC: 32.7 g/dL (ref 30.0–36.0)
MCV: 94.3 fL (ref 80.0–100.0)
Platelets: 251 10*3/uL (ref 150–400)
RBC: 4.18 MIL/uL — ABNORMAL LOW (ref 4.22–5.81)
RDW: 14.1 % (ref 11.5–15.5)
WBC: 12.2 10*3/uL — ABNORMAL HIGH (ref 4.0–10.5)
nRBC: 0 % (ref 0.0–0.2)

## 2021-10-14 LAB — HEPATIC FUNCTION PANEL
ALT: 35 U/L (ref 0–44)
AST: 34 U/L (ref 15–41)
Albumin: 3.1 g/dL — ABNORMAL LOW (ref 3.5–5.0)
Alkaline Phosphatase: 53 U/L (ref 38–126)
Bilirubin, Direct: 0.3 mg/dL — ABNORMAL HIGH (ref 0.0–0.2)
Indirect Bilirubin: 1.9 mg/dL — ABNORMAL HIGH (ref 0.3–0.9)
Total Bilirubin: 2.2 mg/dL — ABNORMAL HIGH (ref 0.3–1.2)
Total Protein: 6.7 g/dL (ref 6.5–8.1)

## 2021-10-14 LAB — CULTURE, BLOOD (ROUTINE X 2)
Culture: NO GROWTH
Culture: NO GROWTH
Special Requests: ADEQUATE

## 2021-10-14 LAB — BASIC METABOLIC PANEL
Anion gap: 10 (ref 5–15)
BUN: 31 mg/dL — ABNORMAL HIGH (ref 8–23)
CO2: 25 mmol/L (ref 22–32)
Calcium: 8.9 mg/dL (ref 8.9–10.3)
Chloride: 101 mmol/L (ref 98–111)
Creatinine, Ser: 0.81 mg/dL (ref 0.61–1.24)
GFR, Estimated: >60 mL/min (ref >60)
Glucose, Bld: 105 mg/dL — ABNORMAL HIGH (ref 70–99)
Potassium: 3.9 mmol/L (ref 3.5–5.1)
Sodium: 136 mmol/L (ref 135–145)

## 2021-10-14 LAB — GLUCOSE, CAPILLARY: Glucose-Capillary: 99 mg/dL (ref 70–99)

## 2021-10-14 MED ORDER — SENNA 8.6 MG PO TABS
1.0000 | ORAL_TABLET | Freq: Every day | ORAL | Status: DC
  Administered 2021-10-14 – 2021-10-18 (×5): 8.6 mg via ORAL
  Filled 2021-10-14 (×5): qty 1

## 2021-10-14 NOTE — Progress Notes (Signed)
Physical Therapy Treatment Patient Details Name: Nicholas Finley MRN: [id number] DOB: 1926/03/20 Today's Date: 10/14/2021   History of Present Illness Pt is a 87 y.o. male admitted 10/09/21 with worsening chest pain and SOB. Workup for NSTEMI, acute respiratory failure. PMH includes HTN, heart murmur, sepsis, cataract.   PT Comments    Pt progressing with mobility. Today's session focused on continued transfer and gait training with SPC, pt moving well with supervision for safety. Pt does not demonstrate LOB with various higher level balance tasks, although gait speed of 1.2 ft/sec does indicate increased risk for falls. Pt motivated to participate, reporting, "I'll do whatever you want me to do because it gets me one step closer to going home." Per MD, family with safety concerns regarding pt's return home, therefore, pt may be good candidate for LTC/ALF to allow for increased supervision. Will continue to follow acutely to address established goals.    Recommendations for follow up therapy are one component of a multi-disciplinary discharge planning process, led by the attending physician.  Recommendations may be updated based on patient status, additional functional criteria and insurance authorization.  Follow Up Recommendations  Home health PT;Supervision - Intermittent     Equipment Recommendations  None recommended by PT    Recommendations for Other Services       Precautions / Restrictions Precautions Precautions: Fall Restrictions Weight Bearing Restrictions: No     Mobility  Bed Mobility Overal bed mobility: Modified Independent Bed Mobility: Supine to Sit;Sit to Supine                Transfers Overall transfer level: Needs assistance Equipment used: Straight cane Transfers: Sit to/from Stand Sit to Stand: Supervision            Ambulation/Gait Ambulation/Gait assistance: Min guard;Supervision Gait Distance (Feet): 450 Feet Assistive device:  Straight cane Gait Pattern/deviations: Step-through pattern;Decreased stride length;Wide base of support Gait velocity: 1.2 ft/sec Gait velocity interpretation: <1.31 ft/sec, indicative of household ambulator General Gait Details: Slow, mostly steady gait with SPC that pt does not consistently use; initial min guard for balance progressing to supervision; no overt instability or LOB with direction changes, stopping, head turns   Chief Strategy Officer    Modified Rankin (Stroke Patients Only)       Balance Overall balance assessment: Needs assistance Sitting-balance support: Feet supported Sitting balance-Leahy Scale: Good       Standing balance-Leahy Scale: Fair Standing balance comment: Can static stand and walk without UE support; static and dynamic stability improved with cane             High level balance activites: Side stepping;Direction changes;Turns;Sudden stops;Head turns High Level Balance Comments: No LOB with higher level balance tasks while ambulating with cane            Cognition Arousal/Alertness: Awake/alert Behavior During Therapy: WFL for tasks assessed/performed Overall Cognitive Status: Within Functional Limits for tasks assessed                                 General Comments: WFL for simple tasks, not formally assessed. Pt expressing desire to return home; states he plans to give up driver's license next year      Exercises      General Comments General comments (skin integrity, edema, etc.): Pt states, "I'll do whatever you want me to do because it gets  me one step closer to going home"      Pertinent Vitals/Pain Pain Assessment: No/denies pain    Home Living                      Prior Function            PT Goals (current goals can now be found in the care plan section) Progress towards PT goals: Progressing toward goals    Frequency    Min 3X/week      PT Plan Current  plan remains appropriate    Co-evaluation              AM-PAC PT "6 Clicks" Mobility   Outcome Measure  Help needed turning from your back to your side while in a flat bed without using bedrails?: None Help needed moving from lying on your back to sitting on the side of a flat bed without using bedrails?: None Help needed moving to and from a bed to a chair (including a wheelchair)?: A Little Help needed standing up from a chair using your arms (e.g., wheelchair or bedside chair)?: A Little Help needed to walk in hospital room?: A Little Help needed climbing 3-5 steps with a railing? : A Little 6 Click Score: 20    End of Session Equipment Utilized During Treatment: Gait belt Activity Tolerance: Patient tolerated treatment well Patient left: in bed;with call bell/phone within reach;with bed alarm set Nurse Communication: Mobility status PT Visit Diagnosis: Other abnormalities of gait and mobility (R26.89);Muscle weakness (generalized) (M62.81)     Time: 8159-4707 PT Time Calculation (min) (ACUTE ONLY): 25 min  Charges:  $Gait Training: 8-22 mins $Therapeutic Activity: 8-22 mins                     Mabeline Caras, PT, DPT Acute Rehabilitation Services  Pager [phone] Office [place] 10/14/2021, 5:22 PM

## 2021-10-14 NOTE — Progress Notes (Signed)
PROGRESS NOTE    Nicholas Finley  LFY:[id number] DOB: Sep 11, 1926 DOA: 10/09/2021 PCP: [name], MD   Brief Narrative: 87 year old with past medical history significant for hypertension, HLD, who presented with worsening chest pain and shortness of breath over the last 2 days prior to admission.  He also reported worsening dyspnea over several weeks.  On EMS arrival patient was found hypoxic oxygen saturation in the 60 and EKG with concern for ST depression in the inferior and lateral leads.  Patient was admitted with non-STEMI, he was also placed on BiPAP with some improvement of his respiratory distress, he was a started on nitroglycerin drip, subsequently nitroglycerin is stopped due to low blood pressure.  Patient was also started on a heparin drip.  Patient underwent heart cath which showed critical ostial left main stenosis which was heavily calcified, moderate proximal LAD and circumflex stenosis.  Severe aortic stenosis.  Patient does not wish to proceed with high risk procedure.  He was made DNR.  Palliative care has been consulted for further goals of care and help with disposition.   Assessment & Plan:   Active Problems:   Chest pain   Sepsis with acute hypoxic respiratory failure and septic shock ([place])   Community acquired pneumonia   Respiratory failure ([place])   Pulmonary edema   Nonrheumatic aortic valve stenosis   1-Acute hypoxic respiratory failure secondary to diffuse pulmonary edema and or pneumonia: Patient improved with BiPAP.  Currently off of BiPAP. He received IV lasix.  Plan to complete  oral course of antibiotics with Augmentin.  -On oral  lasix.   2-NSTEMI, Acute systolic heart failure exacerbation, Aortic stenosis  -Cath: which showed critical ostial left main stenosis which was heavily calcified, moderate proximal LAD and circumflex stenosis.  Severe aortic stenosis. -Patient does not  wishes to proceed to high risk procedure. -Medical management.   He received IV heparin drip, Plavix, statins.  Leukocytosis: Related to aspiration versus reactive: On antibiotics follow trend.  Hypokalemia: Replaced.  Transaminases: Likely hepatic congestion from heart failure.     Estimated body mass index is 22.37 kg/m as calculated from the following:   Height as of 09/07/21: 5' 5.5" (1.664 m).   Weight as of this encounter: 61.9 kg.   DVT prophylaxis: Lovenox Code Status: DNR Family Communication: Disposition Plan:  Status is: Inpatient  Remains inpatient appropriate because: awaiting safe discharge plan.         Consultants:  Cardiology   Procedures:  Cath  Antimicrobials:  Augmentin   Subjective: He is alert, denies chest pain. He would not want to go to rehab.   Objective: Vitals:   10/14/21 0001 10/14/21 0347 10/14/21 0753 10/14/21 1151  BP:  123/71 109/68 117/71  Pulse:  88 89 93  Resp:  18    Temp:  97.9 F (36.6 C) 98.3 F (36.8 C)   TempSrc:  Oral Oral   SpO2:  99% 99% 96%  Weight: 61.9 kg       Intake/Output Summary (Last 24 hours) at 10/14/2021 1545 Last data filed at 10/14/2021 1431 Gross per 24 hour  Intake 1140 ml  Output 975 ml  Net 165 ml    Filed Weights   10/12/21 0600 10/13/21 0500 10/14/21 0001  Weight: 63.6 kg 61.9 kg 61.9 kg    Examination:  General exam: NAD Respiratory system: CTA Cardiovascular system: S 1, S 2 RRR Gastrointestinal system: BS present, soft, nt Central nervous system: Alert  Extremities: Symmetric power.  Data Reviewed: I have personally reviewed following labs and imaging studies  CBC: Recent Labs  Lab 10/09/21 1710 10/09/21 1720 10/10/21 0130 10/10/21 0408 10/11/21 0601 10/11/21 1112 10/11/21 1115 10/11/21 1116 10/12/21 0426 10/13/21 0409 10/14/21 0332  WBC 21.1*  --  29.5*  --  19.9*  --   --   --  18.2* 14.1* 12.2*  NEUTROABS 8.8*  --   --   --   --   --   --   --   --   --   --   HGB 14.0   < > 14.2   < > 12.7*   < > 13.3 13.3 12.2*  12.5* 12.9*  HCT 46.0   < > 43.4   < > 37.8*   < > 39.0 39.0 37.0* 37.7* 39.4  MCV 102.9*  --  95.8  --  93.8  --   --   --  93.2 92.6 94.3  PLT 318  --  265  --  233  --   --   --  233 255 251   < > = values in this interval not displayed.    Basic Metabolic Panel: Recent Labs  Lab 10/10/21 0130 10/10/21 0408 10/12/21 0426 10/12/21 1111 10/12/21 1641 10/13/21 0409 10/14/21 0650  NA 136   < > 137 134* 136 137 136  K 4.1   < > 3.5 3.7 4.6 4.0 3.9  CL 108   < > 101 102 103 102 101  CO2 18*   < > 22 25 23 24 25   GLUCOSE 153*   < > 110* 109* 99 106* 105*  BUN 28*   < > 34* 33* 35* 34* 31*  CREATININE 0.93   < > 0.82 0.87 0.88 0.94 0.81  CALCIUM 8.3*   < > 8.4* 8.7* 9.0 8.9 8.9  MG 2.2  --   --   --   --  2.0  --   PHOS 4.4  --   --   --   --   --   --    < > = values in this interval not displayed.    GFR: Estimated Creatinine Clearance: 49.9 mL/min (by C-G formula based on SCr of 0.81 mg/dL). Liver Function Tests: Recent Labs  Lab 10/09/21 1710 10/14/21 0650  AST 114* 34  ALT 66* 35  ALKPHOS 70 53  BILITOT 0.9 2.2*  PROT 6.9 6.7  ALBUMIN 3.3* 3.1*    No results for input(s): LIPASE, AMYLASE in the last 168 hours. No results for input(s): AMMONIA in the last 168 hours. Coagulation Profile: No results for input(s): INR, PROTIME in the last 168 hours. Cardiac Enzymes: No results for input(s): CKTOTAL, CKMB, CKMBINDEX, TROPONINI in the last 168 hours. BNP (last 3 results) No results for input(s): PROBNP in the last 8760 hours. HbA1C: No results for input(s): HGBA1C in the last 72 hours.  CBG: Recent Labs  Lab 10/13/21 0321 10/13/21 0646 10/13/21 0834 10/13/21 1147 10/13/21 1543  GLUCAP 108* 100* 127* 138* 99    Lipid Profile: No results for input(s): CHOL, HDL, LDLCALC, TRIG, CHOLHDL, LDLDIRECT in the last 72 hours.  Thyroid Function Tests: No results for input(s): TSH, T4TOTAL, FREET4, T3FREE, THYROIDAB in the last 72 hours. Anemia Panel: No results  for input(s): VITAMINB12, FOLATE, FERRITIN, TIBC, IRON, RETICCTPCT in the last 72 hours. Sepsis Labs: Recent Labs  Lab 10/09/21 1710 10/09/21 1921 10/10/21 1051 10/10/21 1410 10/11/21 0601 10/12/21 0426  PROCALCITON  --   --   --  4.07 2.76 1.67  LATICACIDVEN 8.5* 3.4* 2.2* 1.7  --   --      Recent Results (from the past 240 hour(s))  Blood culture (routine x 2)     Status: None   Collection Time: 10/09/21  5:10 PM   Specimen: BLOOD  Result Value Ref Range Status   Specimen Description BLOOD SITE NOT SPECIFIED  Final   Special Requests   Final    BOTTLES DRAWN AEROBIC ONLY Blood Culture results may not be optimal due to an inadequate volume of blood received in culture bottles   Culture   Final    NO GROWTH 5 DAYS Performed at [facility], [place] [address], [place], [place] [address]    Report Status 10/14/2021 FINAL  Final  Resp Panel by RT-PCR (Flu A&B, Covid) Nasopharyngeal Swab     Status: None   Collection Time: 10/09/21  7:09 PM   Specimen: Nasopharyngeal Swab; Nasopharyngeal(NP) swabs in vial transport medium  Result Value Ref Range Status   SARS Coronavirus 2 by RT PCR NEGATIVE NEGATIVE Final    Comment: (NOTE) SARS-CoV-2 target nucleic acids are NOT DETECTED.  The SARS-CoV-2 RNA is generally detectable in upper respiratory specimens during the acute phase of infection. The lowest concentration of SARS-CoV-2 viral copies this assay can detect is 138 copies/mL. A negative result does not preclude SARS-Cov-2 infection and should not be used as the sole basis for treatment or other patient management decisions. A negative result may occur with  improper specimen collection/handling, submission of specimen other than nasopharyngeal swab, presence of viral mutation(s) within the areas targeted by this assay, and inadequate number of viral copies(<138 copies/mL). A negative result must be combined with clinical observations, patient history, and  epidemiological information. The expected result is Negative.  Fact Sheet for Patients:  EntrepreneurPulse.com.au  Fact Sheet for Healthcare Providers:  IncredibleEmployment.be  This test is no t yet approved or cleared by the [place] FDA and  has been authorized for detection and/or diagnosis of SARS-CoV-2 by FDA under an Emergency Use Authorization (EUA). This EUA will remain  in effect (meaning this test can be used) for the duration of the COVID-19 declaration under Section 564(b)(1) of the Act, 21 U.S.C.section 360bbb-3(b)(1), unless the authorization is terminated  or revoked sooner.       Influenza A by PCR NEGATIVE NEGATIVE Final   Influenza B by PCR NEGATIVE NEGATIVE Final    Comment: (NOTE) The Xpert Xpress SARS-CoV-2/FLU/RSV plus assay is intended as an aid in the diagnosis of influenza from Nasopharyngeal swab specimens and should not be used as a sole basis for treatment. Nasal washings and aspirates are unacceptable for Xpert Xpress SARS-CoV-2/FLU/RSV testing.  Fact Sheet for Patients: EntrepreneurPulse.com.au  Fact Sheet for Healthcare Providers: IncredibleEmployment.be  This test is not yet approved or cleared by the [place] FDA and has been authorized for detection and/or diagnosis of SARS-CoV-2 by FDA under an Emergency Use Authorization (EUA). This EUA will remain in effect (meaning this test can be used) for the duration of the COVID-19 declaration under Section 564(b)(1) of the Act, 21 U.S.C. section 360bbb-3(b)(1), unless the authorization is terminated or revoked.  Performed at [facility], [place] [address] [place], [place] [address]   Blood culture (routine x 2)     Status: None   Collection Time: 10/09/21  9:25 PM   Specimen: BLOOD  Result Value Ref Range Status   Specimen Description BLOOD RIGHT ANTECUBITAL  Final  Special Requests   Final    BOTTLES  DRAWN AEROBIC AND ANAEROBIC Blood Culture adequate volume   Culture   Final    NO GROWTH 5 DAYS Performed at [facility], [place] [address], [place], [place] [address]    Report Status 10/14/2021 FINAL  Final  MRSA Next Gen by PCR, Nasal     Status: None   Collection Time: 10/10/21  5:27 AM   Specimen: Nasal Mucosa; Nasal Swab  Result Value Ref Range Status   MRSA by PCR Next Gen NOT DETECTED NOT DETECTED Final    Comment: (NOTE) The GeneXpert MRSA Assay (FDA approved for NASAL specimens only), is one component of a comprehensive MRSA colonization surveillance program. It is not intended to diagnose MRSA infection nor to guide or monitor treatment for MRSA infections. Test performance is not FDA approved in patients less than 71 years old. Performed at [facility], [place] [address], [place], [place] [address]   Respiratory (~20 pathogens) panel by PCR     Status: None   Collection Time: 10/10/21  5:27 AM   Specimen: Nasopharyngeal Swab; Respiratory  Result Value Ref Range Status   Adenovirus NOT DETECTED NOT DETECTED Final   Coronavirus 229E NOT DETECTED NOT DETECTED Final    Comment: (NOTE) The Coronavirus on the Respiratory Panel, DOES NOT test for the novel  Coronavirus (2019 nCoV)    Coronavirus HKU1 NOT DETECTED NOT DETECTED Final   Coronavirus NL63 NOT DETECTED NOT DETECTED Final   Coronavirus OC43 NOT DETECTED NOT DETECTED Final   Metapneumovirus NOT DETECTED NOT DETECTED Final   Rhinovirus / Enterovirus NOT DETECTED NOT DETECTED Final   Influenza A NOT DETECTED NOT DETECTED Final   Influenza B NOT DETECTED NOT DETECTED Final   Parainfluenza Virus 1 NOT DETECTED NOT DETECTED Final   Parainfluenza Virus 2 NOT DETECTED NOT DETECTED Final   Parainfluenza Virus 3 NOT DETECTED NOT DETECTED Final   Parainfluenza Virus 4 NOT DETECTED NOT DETECTED Final   Respiratory Syncytial Virus NOT DETECTED NOT DETECTED Final   Bordetella pertussis NOT DETECTED NOT DETECTED  Final   Bordetella Parapertussis NOT DETECTED NOT DETECTED Final   Chlamydophila pneumoniae NOT DETECTED NOT DETECTED Final   Mycoplasma pneumoniae NOT DETECTED NOT DETECTED Final    Comment: Performed at [facility], [place]. [address] [place], [place] [address]          Radiology Studies: No results found.      Scheduled Meds:  amoxicillin-clavulanate  1 tablet Oral Q12H   aspirin EC  81 mg Oral Daily   atorvastatin  40 mg Oral Daily   clopidogrel  75 mg Oral Daily   enoxaparin (LOVENOX) injection  40 mg Subcutaneous Q24H   furosemide  40 mg Oral BID   sodium chloride flush  3 mL Intravenous Q12H   Continuous Infusions:   LOS: 5 days    Time spent: 35 minutes    Anysia Choi A Haru Shaff, MD [facility]   If 7PM-7AM, please contact night-coverage [email or web address]  10/14/2021, 3:45 PM

## 2021-10-14 NOTE — Progress Notes (Signed)
Palliative:  HPI: 87 y.o. male  with past medical history of HTN, HLD admitted on 10/09/2021 with chest pain and shortness of breath and found to have NSTEMI, severe aortic stenosis, EF 40%. He has opted for conservative medical management.   I met today at Nicholas Finley bedside along with his friend, Pilar Plate, and joined by his cousin, [name]. We reviewed our conversation reviewing his changes in his heart conditions and how this could impact his daily life and lead to further symptoms and challenges moving forward. Reviewed with them goals and MOST form. Mr. Nicholas Finley confirms DNR status and shares about making this decision for his father. He also shares that he would want to return to hospital and see if any other options or medication changes to help him feel better. We also discussed that if there are no other good options how hospice can be helpful to manage symptoms and optimize quality of life. He is not interested in invasive/aggressive interventions but maintaining what he can. Friends/family at bedside express concern with home and steps and how well he can manage as he is home alone. We discussed First Alert type system and consideration of hiring some help at home. There was talk of rehab stay and Nicholas Finley does report that he would be open to brief stay. There is nobody that will be able to stay with him. He would want antibiotics if indicated benefits vs risks, trial of IV fluids, NO feeding tube.   Living Will on file and consistent with stated wishes. Discussed further with friend/family concerns for returning home and that we can encourage assistance and hired help at home and even help from hospice at some point. [name] seems willing to assist with helping him to find assistance. However, as long as he is alert and oriented if he wishes to return home we cannot stop him. I did speak with Nicholas Finley about considering ALF or at least hiring aides and help at home and he is open to hiring  assistance and says he is open ALF if needed in the future. He does express openness to rehab stay as his friend recommends this at bedside.   All questions/concerns addressed. Emotional support provided. Updated Dr. Tyrell Antonio, TOC, PT.   Exam: Alert, oriented. No distress. Sitting up on side of bed. Breathing regular, unlabored. Abd flat. Moves all extremities.   Plan: - MOST complete: DNR, limited interventions with return to hospital but no invasive/aggressive interventions desired, antibiotics as indicated, trial of IVF, NO feeding tube.  - PT reassess consideration/benefit of SNF rehab stay.   [place], NP Palliative Medicine Team Pager [phone] (Please see [email or web address] for schedule) Team Phone [phone]    Greater than 50%  of this time was spent counseling and coordinating care related to the above assessment and plan

## 2021-10-14 NOTE — Plan of Care (Signed)
  Problem: Education: Goal: Knowledge of General Education information will improve Description Including pain rating scale, medication(s)/side effects and non-pharmacologic comfort measures Outcome: Progressing   Problem: Health Behavior/Discharge Planning: Goal: Ability to manage health-related needs will improve Outcome: Progressing   

## 2021-10-14 NOTE — Plan of Care (Signed)
  Problem: Clinical Measurements: Goal: Ability to maintain clinical measurements within normal limits will improve Outcome: Progressing   Problem: Clinical Measurements: Goal: Cardiovascular complication will be avoided Outcome: Progressing   

## 2021-10-14 NOTE — Care Management Important Message (Signed)
Important Message  Patient Details  Name: Nicholas Finley MRN: [id number] Date of Birth: [birth year]   Medicare Important Message Given:  Yes     Shelda Altes 10/14/2021, 10:37 AM

## 2021-10-14 NOTE — Progress Notes (Addendum)
Progress Note  Patient Name: Nicholas Finley Date of Encounter: 10/14/2021  [facility] Cardiologist: Dr. Irish Lack  Subjective   No acute overnight events. No chest pain or shortness of breath. No orthopnea or PND overnight. He feels weak but has been able to ambulate with PT.  Inpatient Medications    Scheduled Meds:  amoxicillin-clavulanate  1 tablet Oral Q12H   aspirin EC  81 mg Oral Daily   atorvastatin  40 mg Oral Daily   clopidogrel  75 mg Oral Daily   enoxaparin (LOVENOX) injection  40 mg Subcutaneous Q24H   furosemide  40 mg Oral BID   sodium chloride flush  3 mL Intravenous Q12H   Continuous Infusions:  PRN Meds: docusate sodium, nitroGLYCERIN   Vital Signs    Vitals:   10/13/21 2348 10/14/21 0001 10/14/21 0347 10/14/21 0753  BP: 115/74  123/71 109/68  Pulse: 90  88 89  Resp: 18  18   Temp: (!) 97.5 F (36.4 C)  97.9 F (36.6 C) 98.3 F (36.8 C)  TempSrc: Oral  Oral Oral  SpO2: 98%  99% 99%  Weight:  61.9 kg      Intake/Output Summary (Last 24 hours) at 10/14/2021 1123 Last data filed at 10/14/2021 1048 Gross per 24 hour  Intake 900 ml  Output 950 ml  Net -50 ml   Last 3 Weights 10/14/2021 10/13/2021 10/12/2021  Weight (lbs) 136 lb 8 oz 136 lb 7.4 oz 140 lb 3.4 oz  Weight (kg) 61.916 kg 61.9 kg 63.6 kg      Telemetry    Sinus rhythm with rates in the 80s to 90s. - Personally Reviewed  ECG    No new ECG tracing today. - Personally Reviewed  Physical Exam   GEN: No acute distress.   Neck: No JVD. Cardiac: RRR. III/VI systolic murmur. No rubs or gallops.  Respiratory: Decreased breath sounds in bases but no wheezes, rhonchi, or rales appreciated. GI: Soft, non-distended, and non-tender. MS: No lower extremity edema. No deformity. Skin: Lower extremities slightly cool to the touch but dry. Neuro:  No focal deficits. Psych: Normal affect. Responds appropriately.  Labs    High Sensitivity Troponin:   Recent Labs  Lab  10/09/21 1710 10/09/21 1921 10/10/21 0500 10/11/21 0601  TROPONINIHS 155* 1,071* 20,610* 7,652*     Chemistry Recent Labs  Lab 10/09/21 1710 10/09/21 1720 10/10/21 0130 10/10/21 0408 10/12/21 1641 10/13/21 0409 10/14/21 0650  NA 139   < > 136   < > 136 137 136  K 4.8   < > 4.1   < > 4.6 4.0 3.9  CL 107  --  108   < > 103 102 101  CO2 15*  --  18*   < > 23 24 25   GLUCOSE 295*  --  153*   < > 99 106* 105*  BUN 23  --  28*   < > 35* 34* 31*  CREATININE 1.47*  --  0.93   < > 0.88 0.94 0.81  CALCIUM 8.8*  --  8.3*   < > 9.0 8.9 8.9  MG  --   --  2.2  --   --  2.0  --   PROT 6.9  --   --   --   --   --  6.7  ALBUMIN 3.3*  --   --   --   --   --  3.1*  AST 114*  --   --   --   --   --  34  ALT 66*  --   --   --   --   --  35  ALKPHOS 70  --   --   --   --   --  53  BILITOT 0.9  --   --   --   --   --  2.2*  GFRNONAA 44*  --  >60   < > >60 >60 >60  ANIONGAP 17*  --  10   < > 10 11 10    < > = values in this interval not displayed.    Lipids  Recent Labs  Lab 10/11/21 0746  CHOL 128  TRIG 83  HDL 56  LDLCALC 55  CHOLHDL 2.3    Hematology Recent Labs  Lab 10/12/21 0426 10/13/21 0409 10/14/21 0332  WBC 18.2* 14.1* 12.2*  RBC 3.97* 4.07* 4.18*  HGB 12.2* 12.5* 12.9*  HCT 37.0* 37.7* 39.4  MCV 93.2 92.6 94.3  MCH 30.7 30.7 30.9  MCHC 33.0 33.2 32.7  RDW 14.1 14.0 14.1  PLT 233 255 251   Thyroid No results for input(s): TSH, FREET4 in the last 168 hours.  BNP Recent Labs  Lab 10/09/21 1710 10/10/21 1410 10/11/21 0601  BNP 625.3* 1,802.6* 945.1*    DDimer No results for input(s): DDIMER in the last 168 hours.   Radiology    No results found.  Cardiac Studies   Echocardiogram 10/10/2021: Impressions:  1. Left ventricular ejection fraction, by estimation, is 40 to 45%. The  left ventricle has mildly decreased function. The left ventricle  demonstrates regional wall motion abnormalities (see scoring  diagram/findings for description). Left ventricular   diastolic parameters are consistent with Grade I diastolic dysfunction  (impaired relaxation).   2. Right ventricular systolic function is normal. The right ventricular  size is normal.   3. The mitral valve is normal in structure. No evidence of mitral valve  regurgitation. No evidence of mitral stenosis.   4. The aortic valve is calcified. There is moderate calcification of the  aortic valve. There is moderate thickening of the aortic valve. Aortic  valve regurgitation is moderate. Moderate aortic valve stenosis. Aortic  valve mean gradient measures 25.2  mmHg. Aortic valve Vmax measures 3.16 m/s.   5. The inferior vena cava is dilated in size with <50% respiratory  variability, suggesting right atrial pressure of 15 mmHg. _______________  Lower Extremity Dopplers 10/10/2021: Summary: - No evidence of deep vein thrombosis seen in the lower extremities,  bilaterally.  -No evidence of popliteal cyst, bilaterally.  _______________  Right/Left Cardiac Catheterization 10/11/2021:   Colon Flattery LM lesion is 95% stenosed.   Prox LAD to Mid LAD lesion is 70% stenosed.   Ost Cx to Prox Cx lesion is 80% stenosed.   LV end diastolic pressure is mildly elevated.   Hemodynamic findings consistent with mild pulmonary hypertension.   There is severe aortic valve stenosis.   Critical ostial left  main stenosis - heavily calcified Moderate proximal LAD and LCx stenosis also heavily calcified Severe aortic stenosis. Mean gradient 28 mm Hg with AVA 0.72 cm squared, index 0.41 Elevated LV filling pressures - mean PCWP 24 mm Hg Mild pulmonary HTN. Mean PAP 33 mm Hg Low cardiac output 3.49 L/min with index 1.97.   Plan: patient has very high risk anatomy with critical ostial Left main stenosis and severe AS. Will continue IV diuresis. Add inotropic support with IV milrinone. Will need heart team approach to review possible treatment options.   Diagnostic Dominance:  Right    Patient Profile     87  y.o. male with a history of TIA, hypertension, and hyperlipidemia who was admitted with NSTEMI and acute hypoxic respiratory failure on 10/09/2021 after presenting with chest pain and severe shortness of breath. Work-up showed critical ostial left main stenosis and severe aortic stenosis. He is felt ot be high risk for any percutaneous intervention and not a candidate for surgical management. Therefore, plan is for conservative management. Palliative Care following.  Assessment & Plan    NSTEMI Severe Aortic Stenosis  - High-sensitivity troponin peaked at 20,610.  - Echo showed LVEF of 40-45% with akinesis of the apical lateral segment, mid inferoseptal segment, apical septal segment, and apex as well as grade 1 diastolic dysfunction and moderate AS. - R/LHC on 10/18 showed critical left main heavily calcified stenosis, 70% stenosis of the proximal to mid LAD, and 80% stenosis of the ostial to proximal LCX, severe aortic stenosis, and low cardiac output of 3.49 L/min.  - Not a candidate for bypass surgery/AVR and would be at high risk for periprocedural complication and death if percutaneous intervention was attempted. Therefore, medical therapy and palliative care recommended. Palliative care is following. - He denies any recurrent chest pain. - Continue DAPT with Aspirin and Plavix. - Continue high-intensity statin.  Acute Systolic CHF Ischemic Cardiomyopathy Low Output Cardiomyopathy - BNP 625 >> 1,802 >> 945. - Echo as above.  - R/LHC showed elevated LV filling pressures with mean PCWP of 24 mmHg and low cardiac output of 3.49 L/min with index of 1.97. - Started on Milrinone which was able to be stopped on 10/19. Initially diuresed with IV Lasix but has been switched to PO starting today. Net negative 4.2 L this admission. Weight 136 lbs today, down from 149 lbs on admission. Renal function stable. - BP soft but stable. Lower extremities mildly cool to the touch. Will hold off on starting  beta-blocker for now and will discuss with MD. - Continue to monitor volume status closely.   Hypertension - History of hypertension but BP has been soft this admission.  - Initially required Milrinone but this was able to be stopped on 10/19. - Home Lisinopril has been stopped. Will continue to hold this.  Hyperlipidemia - LDL 69 on 09/07/2021. - Continue Lipitor 40mg  daily.  Otherwise, per primary team.  For questions or updates, please contact [place] Please consult [email or web address] for contact info under        Signed, Darreld Mclean, PA-C  10/14/2021, 11:23 AM    I have examined the patient and reviewed assessment and plan and discussed with patient.  Agree with above as stated.    Doing well with medical therapy for CAD.  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  Not interested in invasive therapy.  Agree with home health/palliative care/hospice options with medical therapy.    He should go home on some oral Lasix to prevent volume overload.  Larae Grooms

## 2021-10-14 NOTE — Progress Notes (Signed)
Patient has urinated twice and blood was in urinal /toilet. Please assess as needed.

## 2021-10-15 DIAGNOSIS — I214 Non-ST elevation (NSTEMI) myocardial infarction: Secondary | ICD-10-CM | POA: Diagnosis not present

## 2021-10-15 DIAGNOSIS — R072 Precordial pain: Secondary | ICD-10-CM

## 2021-10-15 LAB — BASIC METABOLIC PANEL
Anion gap: 9 (ref 5–15)
BUN: 30 mg/dL — ABNORMAL HIGH (ref 8–23)
CO2: 23 mmol/L (ref 22–32)
Calcium: 8.7 mg/dL — ABNORMAL LOW (ref 8.9–10.3)
Chloride: 103 mmol/L (ref 98–111)
Creatinine, Ser: 0.83 mg/dL (ref 0.61–1.24)
GFR, Estimated: >60 mL/min (ref >60)
Glucose, Bld: 109 mg/dL — ABNORMAL HIGH (ref 70–99)
Potassium: 3.8 mmol/L (ref 3.5–5.1)
Sodium: 135 mmol/L (ref 135–145)

## 2021-10-15 LAB — CBC
HCT: 37.2 % — ABNORMAL LOW (ref 39.0–52.0)
Hemoglobin: 12.4 g/dL — ABNORMAL LOW (ref 13.0–17.0)
MCH: 30.9 pg (ref 26.0–34.0)
MCHC: 33.3 g/dL (ref 30.0–36.0)
MCV: 92.8 fL (ref 80.0–100.0)
Platelets: 274 10*3/uL (ref 150–400)
RBC: 4.01 MIL/uL — ABNORMAL LOW (ref 4.22–5.81)
RDW: 13.8 % (ref 11.5–15.5)
WBC: 12.9 10*3/uL — ABNORMAL HIGH (ref 4.0–10.5)
nRBC: 0 % (ref 0.0–0.2)

## 2021-10-15 NOTE — TOC Initial Note (Signed)
Transition of Care [facility]) - Initial/Assessment Note    Patient Details  Name: Nicholas Finley MRN: [id number] Date of Birth: 04-04-1926  [facility] of [facility]) CM/SW Contact:    Maebelle Munroe, RN Phone Number: 10/15/2021, 5:47 PM  Clinical Narrative:   [facility] team for discharge planning. Patient shares that he is in needs of resources to assist him at home. He shares that he lives alone and needs assistance with errands, housekeeping and being taken to appointments. He shares that he has steps to get to the inside of the home that has a handrail. He shares that the house is on one level except for the sunken livingroom that has 2 steps down. Pt shares that he has friends for support. Pt walks with a cane that is at the bedside. Call to [name] to discuss patient's need for assistance. Left a message for a return call. Pt share [name] is currently out of town. Provided patient with a resource to [employer] [facility] the [place] and referred him to [employer] work for [employer]. Pt. accepted resources and is agreement with the current plan. Will continue to monitor for discharge planning.               Expected Discharge Plan: Home/Self Care Barriers to Discharge: No Barriers Identified   Patient Goals and CMS Choice Patient states their goals for this hospitalization and ongoing recovery are:: Wants to return home to his [place].   Choice offered to / list presented to : Patient  Expected Discharge Plan and Services Expected Discharge Plan: Home/Self Care   Discharge Planning Services: CM Consult   Living arrangements for the past 2 months: Single Family Home                 DME Arranged:  (Patient already has a cane.)                    Prior Living Arrangements/Services Living arrangements for the past 2 months: Single Family Home Lives with:: Self Patient language and need for interpreter reviewed:: No Do you feel  safe going back to the place where you live?: Yes      Need for Family Participation in Patient Care: Yes (Comment) Care giver support system in place?: Yes (comment) (Friend- Frankie and [name].)   Criminal Activity/Legal Involvement Pertinent to Current Situation/Hospitalization: No - Comment as needed  Activities of Daily Living Home Assistive Devices/Equipment: Cane (specify quad or straight) ADL Screening (condition at time of admission) Patient's cognitive ability adequate to safely complete daily activities?: Yes Is the patient deaf or have difficulty hearing?: No Does the patient have difficulty seeing, even when wearing glasses/contacts?: No Does the patient have difficulty concentrating, remembering, or making decisions?: Yes Patient able to express need for assistance with ADLs?: No Does the patient have difficulty dressing or bathing?: No Independently performs ADLs?: Yes (appropriate for developmental age) Does the patient have difficulty walking or climbing stairs?: No Weakness of Legs: None Weakness of Arms/Hands: None  Permission Sought/Granted                  Emotional Assessment Appearance:: Appears younger than stated age Attitude/Demeanor/Rapport: Engaged, Self-Confident, Gracious Affect (typically observed): Pleasant Orientation: : Oriented to Situation, Oriented to  Time, Oriented to Place, Oriented to Self   Psych Involvement: No (comment)  Admission diagnosis:  Respiratory failure ([place]) [J96.90] NSTEMI (non-ST elevated myocardial infarction) ([place]) [I21.4] Acute respiratory failure with hypoxia ([place]) [  J96.01] Community acquired pneumonia, unspecified laterality [J18.9] Patient Active Problem List   Diagnosis Date Noted   Nonrheumatic aortic valve stenosis    Pulmonary edema    Chest pain 10/09/2021   Sepsis with acute hypoxic respiratory failure and septic shock ([place]) 10/09/2021   Community acquired pneumonia 10/09/2021   Respiratory failure ([place])  10/09/2021   NSTEMI (non-ST elevated myocardial infarction) ([place])    Acute respiratory failure with hypoxia ([place])    AKI (acute kidney injury) ([place])    HTN (hypertension) 03/01/2012   Hyperlipidemia 03/01/2012   Hearing difficulty 03/01/2012   Allergic rhinitis 03/01/2012   PCP:  [name], MD Pharmacy:   [facility] [id number] [place], [place] - [address] [place] [address] Phone: [phone] Fax: [phone]  [facility] [address] [place] [address] Phone: [phone] Fax: [phone]     Social Determinants of Health (SDOH) Interventions    Readmission Risk Interventions No flowsheet data found.

## 2021-10-15 NOTE — Progress Notes (Signed)
PROGRESS NOTE    Nicholas Finley  XMI:[id number] DOB: Feb 17, 1926 DOA: 10/09/2021 PCP: [name], MD   Brief Narrative: 87 year old with past medical history significant for hypertension, HLD, who presented with worsening chest pain and shortness of breath over the last 2 days prior to admission.  He also reported worsening dyspnea over several weeks.  On EMS arrival patient was found hypoxic oxygen saturation in the 60 and EKG with concern for ST depression in the inferior and lateral leads.  Patient was admitted with non-STEMI, he was also placed on BiPAP with some improvement of his respiratory distress, he was a started on nitroglycerin drip, subsequently nitroglycerin is stopped due to low blood pressure.  Patient was also started on a heparin drip.  Patient underwent heart cath which showed critical ostial left main stenosis which was heavily calcified, moderate proximal LAD and circumflex stenosis.  Severe aortic stenosis.  Patient does not wish to proceed with high risk procedure.  He was made DNR.  Palliative care has been consulted for further goals of care and help with disposition.   Assessment & Plan:   Active Problems:   Chest pain   Sepsis with acute hypoxic respiratory failure and septic shock ([place])   Community acquired pneumonia   Respiratory failure ([place])   Pulmonary edema   Nonrheumatic aortic valve stenosis   1-Acute hypoxic respiratory failure secondary to diffuse pulmonary edema and or pneumonia: Patient improved with BiPAP.  Currently off of BiPAP. He received IV lasix.  Completed antibiotics.  -On oral  lasix.   2-NSTEMI, Acute systolic heart failure exacerbation, Aortic stenosis  -Cath: which showed critical ostial left main stenosis which was heavily calcified, moderate proximal LAD and circumflex stenosis.  Severe aortic stenosis. -Patient does not  wishes to proceed to high risk procedure. -Medical management.  He received IV heparin drip, Plavix,  statins. Stable.   Leukocytosis: Related to aspiration versus reactive: On antibiotics follow trend.  Hypokalemia: Replaced.  Transaminases: Likely hepatic congestion from heart failure.     Estimated body mass index is 22.11 kg/m as calculated from the following:   Height as of 09/07/21: 5' 5.5" (1.664 m).   Weight as of this encounter: 61.2 kg.   DVT prophylaxis: Lovenox Code Status: DNR Disposition Plan:  Status is: Inpatient  Remains inpatient appropriate because: awaiting safe discharge plan.         Consultants:  Cardiology   Procedures:  Cath  Antimicrobials:  Augmentin   Subjective: He is alert , conversant. Denies chest pain. He does not wants to go to rehab,  Objective: Vitals:   10/15/21 0622 10/15/21 0811 10/15/21 0830 10/15/21 1130  BP: 92/60 102/63 102/61 103/62  Pulse: 85 89  83  Resp:  18  18  Temp:  98.5 F (36.9 C)  98.8 F (37.1 C)  TempSrc:  Oral  Oral  SpO2:  95%  96%  Weight:        Intake/Output Summary (Last 24 hours) at 10/15/2021 1612 Last data filed at 10/15/2021 1313 Gross per 24 hour  Intake 1300 ml  Output 1675 ml  Net -375 ml    Filed Weights   10/13/21 0500 10/14/21 0001 10/15/21 0130  Weight: 61.9 kg 61.9 kg 61.2 kg    Examination:  General exam: NAD Respiratory system: CTA Cardiovascular system: S 1, S 2 RRR Gastrointestinal system: BS present, soft, nt Central nervous system: alert  Extremities: Symmetric power.     Data Reviewed: I have personally reviewed following labs  and imaging studies  CBC: Recent Labs  Lab 10/09/21 1710 10/09/21 1720 10/11/21 0601 10/11/21 1112 10/11/21 1116 10/12/21 0426 10/13/21 0409 10/14/21 0332 10/15/21 0243  WBC 21.1*   < > 19.9*  --   --  18.2* 14.1* 12.2* 12.9*  NEUTROABS 8.8*  --   --   --   --   --   --   --   --   HGB 14.0   < > 12.7*   < > 13.3 12.2* 12.5* 12.9* 12.4*  HCT 46.0   < > 37.8*   < > 39.0 37.0* 37.7* 39.4 37.2*  MCV 102.9*   < > 93.8  --    --  93.2 92.6 94.3 92.8  PLT 318   < > 233  --   --  233 255 251 274   < > = values in this interval not displayed.    Basic Metabolic Panel: Recent Labs  Lab 10/10/21 0130 10/10/21 0408 10/12/21 1111 10/12/21 1641 10/13/21 0409 10/14/21 0650 10/15/21 0243  NA 136   < > 134* 136 137 136 135  K 4.1   < > 3.7 4.6 4.0 3.9 3.8  CL 108   < > 102 103 102 101 103  CO2 18*   < > 25 23 24 25 23   GLUCOSE 153*   < > 109* 99 106* 105* 109*  BUN 28*   < > 33* 35* 34* 31* 30*  CREATININE 0.93   < > 0.87 0.88 0.94 0.81 0.83  CALCIUM 8.3*   < > 8.7* 9.0 8.9 8.9 8.7*  MG 2.2  --   --   --  2.0  --   --   PHOS 4.4  --   --   --   --   --   --    < > = values in this interval not displayed.    GFR: Estimated Creatinine Clearance: 48.1 mL/min (by C-G formula based on SCr of 0.83 mg/dL). Liver Function Tests: Recent Labs  Lab 10/09/21 1710 10/14/21 0650  AST 114* 34  ALT 66* 35  ALKPHOS 70 53  BILITOT 0.9 2.2*  PROT 6.9 6.7  ALBUMIN 3.3* 3.1*    No results for input(s): LIPASE, AMYLASE in the last 168 hours. No results for input(s): AMMONIA in the last 168 hours. Coagulation Profile: No results for input(s): INR, PROTIME in the last 168 hours. Cardiac Enzymes: No results for input(s): CKTOTAL, CKMB, CKMBINDEX, TROPONINI in the last 168 hours. BNP (last 3 results) No results for input(s): PROBNP in the last 8760 hours. HbA1C: No results for input(s): HGBA1C in the last 72 hours.  CBG: Recent Labs  Lab 10/13/21 0321 10/13/21 0646 10/13/21 0834 10/13/21 1147 10/13/21 1543  GLUCAP 108* 100* 127* 138* 99    Lipid Profile: No results for input(s): CHOL, HDL, LDLCALC, TRIG, CHOLHDL, LDLDIRECT in the last 72 hours.  Thyroid Function Tests: No results for input(s): TSH, T4TOTAL, FREET4, T3FREE, THYROIDAB in the last 72 hours. Anemia Panel: No results for input(s): VITAMINB12, FOLATE, FERRITIN, TIBC, IRON, RETICCTPCT in the last 72 hours. Sepsis Labs: Recent Labs  Lab  10/09/21 1710 10/09/21 1921 10/10/21 1051 10/10/21 1410 10/11/21 0601 10/12/21 0426  PROCALCITON  --   --   --  4.07 2.76 1.67  LATICACIDVEN 8.5* 3.4* 2.2* 1.7  --   --      Recent Results (from the past 240 hour(s))  Blood culture (routine x 2)     Status:  None   Collection Time: 10/09/21  5:10 PM   Specimen: BLOOD  Result Value Ref Range Status   Specimen Description BLOOD SITE NOT SPECIFIED  Final   Special Requests   Final    BOTTLES DRAWN AEROBIC ONLY Blood Culture results may not be optimal due to an inadequate volume of blood received in culture bottles   Culture   Final    NO GROWTH 5 DAYS Performed at [facility], [place] [address] [place], [place] [address]    Report Status 10/14/2021 FINAL  Final  Resp Panel by RT-PCR (Flu A&B, Covid) Nasopharyngeal Swab     Status: None   Collection Time: 10/09/21  7:09 PM   Specimen: Nasopharyngeal Swab; Nasopharyngeal(NP) swabs in vial transport medium  Result Value Ref Range Status   SARS Coronavirus 2 by RT PCR NEGATIVE NEGATIVE Final    Comment: (NOTE) SARS-CoV-2 target nucleic acids are NOT DETECTED.  The SARS-CoV-2 RNA is generally detectable in upper respiratory specimens during the acute phase of infection. The lowest concentration of SARS-CoV-2 viral copies this assay can detect is 138 copies/mL. A negative result does not preclude SARS-Cov-2 infection and should not be used as the sole basis for treatment or other patient management decisions. A negative result may occur with  improper specimen collection/handling, submission of specimen other than nasopharyngeal swab, presence of viral mutation(s) within the areas targeted by this assay, and inadequate number of viral copies(<138 copies/mL). A negative result must be combined with clinical observations, patient history, and epidemiological information. The expected result is Negative.  Fact Sheet for Patients:   EntrepreneurPulse.com.au  Fact Sheet for Healthcare Providers:  IncredibleEmployment.be  This test is no t yet approved or cleared by the [place] FDA and  has been authorized for detection and/or diagnosis of SARS-CoV-2 by FDA under an Emergency Use Authorization (EUA). This EUA will remain  in effect (meaning this test can be used) for the duration of the COVID-19 declaration under Section 564(b)(1) of the Act, 21 U.S.C.section 360bbb-3(b)(1), unless the authorization is terminated  or revoked sooner.       Influenza A by PCR NEGATIVE NEGATIVE Final   Influenza B by PCR NEGATIVE NEGATIVE Final    Comment: (NOTE) The Xpert Xpress SARS-CoV-2/FLU/RSV plus assay is intended as an aid in the diagnosis of influenza from Nasopharyngeal swab specimens and should not be used as a sole basis for treatment. Nasal washings and aspirates are unacceptable for Xpert Xpress SARS-CoV-2/FLU/RSV testing.  Fact Sheet for Patients: EntrepreneurPulse.com.au  Fact Sheet for Healthcare Providers: IncredibleEmployment.be  This test is not yet approved or cleared by the [place] FDA and has been authorized for detection and/or diagnosis of SARS-CoV-2 by FDA under an Emergency Use Authorization (EUA). This EUA will remain in effect (meaning this test can be used) for the duration of the COVID-19 declaration under Section 564(b)(1) of the Act, 21 U.S.C. section 360bbb-3(b)(1), unless the authorization is terminated or revoked.  Performed at [facility], [place] [address], [place], [place] [address]   Blood culture (routine x 2)     Status: None   Collection Time: 10/09/21  9:25 PM   Specimen: BLOOD  Result Value Ref Range Status   Specimen Description BLOOD RIGHT ANTECUBITAL  Final   Special Requests   Final    BOTTLES DRAWN AEROBIC AND ANAEROBIC Blood Culture adequate volume   Culture   Final    NO GROWTH 5  DAYS Performed at [facility],  [address] [place], [place] [address]    Report Status 10/14/2021 FINAL  Final  MRSA Next Gen by PCR, Nasal     Status: None   Collection Time: 10/10/21  5:27 AM   Specimen: Nasal Mucosa; Nasal Swab  Result Value Ref Range Status   MRSA by PCR Next Gen NOT DETECTED NOT DETECTED Final    Comment: (NOTE) The GeneXpert MRSA Assay (FDA approved for NASAL specimens only), is one component of a comprehensive MRSA colonization surveillance program. It is not intended to diagnose MRSA infection nor to guide or monitor treatment for MRSA infections. Test performance is not FDA approved in patients less than 43 years old. Performed at [facility], [place] [address] [place], [place] [address]   Respiratory (~20 pathogens) panel by PCR     Status: None   Collection Time: 10/10/21  5:27 AM   Specimen: Nasopharyngeal Swab; Respiratory  Result Value Ref Range Status   Adenovirus NOT DETECTED NOT DETECTED Final   Coronavirus 229E NOT DETECTED NOT DETECTED Final    Comment: (NOTE) The Coronavirus on the Respiratory Panel, DOES NOT test for the novel  Coronavirus (2019 nCoV)    Coronavirus HKU1 NOT DETECTED NOT DETECTED Final   Coronavirus NL63 NOT DETECTED NOT DETECTED Final   Coronavirus OC43 NOT DETECTED NOT DETECTED Final   Metapneumovirus NOT DETECTED NOT DETECTED Final   Rhinovirus / Enterovirus NOT DETECTED NOT DETECTED Final   Influenza A NOT DETECTED NOT DETECTED Final   Influenza B NOT DETECTED NOT DETECTED Final   Parainfluenza Virus 1 NOT DETECTED NOT DETECTED Final   Parainfluenza Virus 2 NOT DETECTED NOT DETECTED Final   Parainfluenza Virus 3 NOT DETECTED NOT DETECTED Final   Parainfluenza Virus 4 NOT DETECTED NOT DETECTED Final   Respiratory Syncytial Virus NOT DETECTED NOT DETECTED Final   Bordetella pertussis NOT DETECTED NOT DETECTED Final   Bordetella Parapertussis NOT DETECTED NOT DETECTED Final   Chlamydophila pneumoniae NOT  DETECTED NOT DETECTED Final   Mycoplasma pneumoniae NOT DETECTED NOT DETECTED Final    Comment: Performed at [facility], [place]. [address], [place], [place] [address]          Radiology Studies: No results found.      Scheduled Meds:  aspirin EC  81 mg Oral Daily   atorvastatin  40 mg Oral Daily   clopidogrel  75 mg Oral Daily   enoxaparin (LOVENOX) injection  40 mg Subcutaneous Q24H   furosemide  40 mg Oral BID   senna  1 tablet Oral Daily   sodium chloride flush  3 mL Intravenous Q12H   Continuous Infusions:   LOS: 6 days    Time spent: 35 minutes    Orlene Salmons A Telina Kleckley, MD [facility]   If 7PM-7AM, please contact night-coverage [email or web address]  10/15/2021, 4:12 PM

## 2021-10-15 NOTE — Progress Notes (Signed)
   No new cardiac recommendations at this time.  Agree with palliative care note.  Most recent cardiology note reviewed.  Consider Lasix perhaps 40 mg once a day at home on discharge.  Signing off.  Please let us know if we can be of further assistance.  Candee Furbish, MD

## 2021-10-15 NOTE — Plan of Care (Signed)
  Problem: Education: Goal: Knowledge of General Education information will improve Description: Including pain rating scale, medication(s)/side effects and non-pharmacologic comfort measures Outcome: Progressing   Problem: Clinical Measurements: Goal: Ability to maintain clinical measurements within normal limits will improve Outcome: Progressing Goal: Will remain free from infection Outcome: Progressing   

## 2021-10-16 DIAGNOSIS — I214 Non-ST elevation (NSTEMI) myocardial infarction: Secondary | ICD-10-CM | POA: Diagnosis not present

## 2021-10-16 LAB — BASIC METABOLIC PANEL
Anion gap: 12 (ref 5–15)
BUN: 24 mg/dL — ABNORMAL HIGH (ref 8–23)
CO2: 22 mmol/L (ref 22–32)
Calcium: 8.9 mg/dL (ref 8.9–10.3)
Chloride: 102 mmol/L (ref 98–111)
Creatinine, Ser: 0.79 mg/dL (ref 0.61–1.24)
GFR, Estimated: >60 mL/min (ref >60)
Glucose, Bld: 110 mg/dL — ABNORMAL HIGH (ref 70–99)
Potassium: 3.6 mmol/L (ref 3.5–5.1)
Sodium: 136 mmol/L (ref 135–145)

## 2021-10-16 LAB — CBC
HCT: 39.6 % (ref 39.0–52.0)
Hemoglobin: 13.1 g/dL (ref 13.0–17.0)
MCH: 31 pg (ref 26.0–34.0)
MCHC: 33.1 g/dL (ref 30.0–36.0)
MCV: 93.8 fL (ref 80.0–100.0)
Platelets: 306 10*3/uL (ref 150–400)
RBC: 4.22 MIL/uL (ref 4.22–5.81)
RDW: 14 % (ref 11.5–15.5)
WBC: 17.6 10*3/uL — ABNORMAL HIGH (ref 4.0–10.5)
nRBC: 0 % (ref 0.0–0.2)

## 2021-10-16 MED ORDER — NITROGLYCERIN 2 % TD OINT
0.5000 [in_us] | TOPICAL_OINTMENT | Freq: Once | TRANSDERMAL | Status: AC
  Administered 2021-10-16: 0.5 [in_us] via TOPICAL
  Filled 2021-10-16: qty 30

## 2021-10-16 MED ORDER — MORPHINE SULFATE (PF) 2 MG/ML IV SOLN
2.0000 mg | INTRAVENOUS | Status: DC | PRN
  Administered 2021-10-16: 2 mg via INTRAVENOUS
  Filled 2021-10-16: qty 1

## 2021-10-16 MED ORDER — OXYCODONE HCL 5 MG PO TABS
5.0000 mg | ORAL_TABLET | ORAL | Status: DC | PRN
  Administered 2021-10-16: 5 mg via ORAL
  Filled 2021-10-16: qty 1

## 2021-10-16 MED ORDER — HYDROMORPHONE HCL 1 MG/ML IJ SOLN
0.5000 mg | Freq: Once | INTRAMUSCULAR | Status: AC | PRN
Start: 2021-10-16 — End: 2021-10-16
  Administered 2021-10-16: 0.5 mg via INTRAVENOUS
  Filled 2021-10-16: qty 0.5

## 2021-10-16 NOTE — Progress Notes (Addendum)
PROGRESS NOTE    Nicholas Finley  XBM:[id number] DOB: [birth year] DOA: 10/09/2021 PCP: [name], MD   Brief Narrative: 87 year old with past medical history significant for hypertension, HLD, who presented with worsening chest pain and shortness of breath over the last 2 days prior to admission.  He also reported worsening dyspnea over several weeks.  On EMS arrival patient was found hypoxic oxygen saturation in the 60 and EKG with concern for ST depression in the inferior and lateral leads.  Patient was admitted with non-STEMI, he was also placed on BiPAP with some improvement of his respiratory distress, he was a started on nitroglycerin drip, subsequently nitroglycerin is stopped due to low blood pressure.  Patient was also started on a heparin drip.  Patient underwent heart cath which showed critical ostial left main stenosis which was heavily calcified, moderate proximal LAD and circumflex stenosis.  Severe aortic stenosis.  Patient does not wish to proceed with high risk procedure.  He was made DNR.  Palliative care has been consulted for further goals of care and help with disposition.   Assessment & Plan:   Active Problems:   Chest pain   Sepsis with acute hypoxic respiratory failure and septic shock ([place])   Community acquired pneumonia   Respiratory failure ([place])   Pulmonary edema   Nonrheumatic aortic valve stenosis   1-Acute hypoxic respiratory failure secondary to diffuse pulmonary edema and or pneumonia: Patient improved with BiPAP.  Currently off of BiPAP. He received IV lasix.  Completed antibiotics.  -On oral  lasix.   2-NSTEMI, Acute systolic heart failure exacerbation, Aortic stenosis  -Cath: which showed critical ostial left main stenosis which was heavily calcified, moderate proximal LAD and circumflex stenosis.  Severe aortic stenosis. -Patient does not  wishes to proceed to high risk procedure. -Medical management.  He received IV heparin drip, Plavix,  statins. Denies chest pain   Leukocytosis: Related to aspiration versus reactive: received antibiotics.  WBC fluctuates. Afebrile , monitor.   Hypokalemia: Replaced.  Transaminases: Likely hepatic congestion from heart failure.     Estimated body mass index is 22.03 kg/m as calculated from the following:   Height as of 09/07/21: 5' 5.5" (1.664 m).   Weight as of this encounter: 61 kg.   DVT prophylaxis: Lovenox Code Status: DNR Disposition Plan:  Status is: Inpatient  Remains inpatient appropriate because: awaiting safe discharge plan. He has not been able to arrange home care, niece will come to town today        Consultants:  Cardiology   Procedures:  Cath  Antimicrobials:  Augmentin   Subjective: He had some reflux, report after having BM felt better.   Objective: Vitals:   10/15/21 1130 10/15/21 2020 10/16/21 0333 10/16/21 1550  BP: 103/62 126/71 94/62 98/64   Pulse: 83 93 87 88  Resp: 18 18 17 16   Temp: 98.8 F (37.1 C) 98.6 F (37 C) 98.2 F (36.8 C) 97.6 F (36.4 C)  TempSrc: Oral Oral Oral Oral  SpO2: 96% 98% 97% 96%  Weight:   61 kg     Intake/Output Summary (Last 24 hours) at 10/16/2021 1642 Last data filed at 10/16/2021 1516 Gross per 24 hour  Intake 1000 ml  Output 1475 ml  Net -475 ml    Filed Weights   10/14/21 0001 10/15/21 0130 10/16/21 0333  Weight: 61.9 kg 61.2 kg 61 kg    Examination:  General exam: NAD Respiratory system: CTA Cardiovascular system: S 1, S 2 RRR Gastrointestinal system: BS  present, soft, nt Central nervous system: Alert.  Extremities: Symmetric power.     Data Reviewed: I have personally reviewed following labs and imaging studies  CBC: Recent Labs  Lab 10/09/21 1710 10/09/21 1720 10/12/21 0426 10/13/21 0409 10/14/21 0332 10/15/21 0243 10/16/21 0251  WBC 21.1*   < > 18.2* 14.1* 12.2* 12.9* 17.6*  NEUTROABS 8.8*  --   --   --   --   --   --   HGB 14.0   < > 12.2* 12.5* 12.9* 12.4* 13.1   HCT 46.0   < > 37.0* 37.7* 39.4 37.2* 39.6  MCV 102.9*   < > 93.2 92.6 94.3 92.8 93.8  PLT 318   < > 233 255 251 274 306   < > = values in this interval not displayed.    Basic Metabolic Panel: Recent Labs  Lab 10/10/21 0130 10/10/21 0408 10/12/21 1641 10/13/21 0409 10/14/21 0650 10/15/21 0243 10/16/21 0251  NA 136   < > 136 137 136 135 136  K 4.1   < > 4.6 4.0 3.9 3.8 3.6  CL 108   < > 103 102 101 103 102  CO2 18*   < > 23 24 25 23 22   GLUCOSE 153*   < > 99 106* 105* 109* 110*  BUN 28*   < > 35* 34* 31* 30* 24*  CREATININE 0.93   < > 0.88 0.94 0.81 0.83 0.79  CALCIUM 8.3*   < > 9.0 8.9 8.9 8.7* 8.9  MG 2.2  --   --  2.0  --   --   --   PHOS 4.4  --   --   --   --   --   --    < > = values in this interval not displayed.    GFR: Estimated Creatinine Clearance: 49.8 mL/min (by C-G formula based on SCr of 0.79 mg/dL). Liver Function Tests: Recent Labs  Lab 10/09/21 1710 10/14/21 0650  AST 114* 34  ALT 66* 35  ALKPHOS 70 53  BILITOT 0.9 2.2*  PROT 6.9 6.7  ALBUMIN 3.3* 3.1*    No results for input(s): LIPASE, AMYLASE in the last 168 hours. No results for input(s): AMMONIA in the last 168 hours. Coagulation Profile: No results for input(s): INR, PROTIME in the last 168 hours. Cardiac Enzymes: No results for input(s): CKTOTAL, CKMB, CKMBINDEX, TROPONINI in the last 168 hours. BNP (last 3 results) No results for input(s): PROBNP in the last 8760 hours. HbA1C: No results for input(s): HGBA1C in the last 72 hours.  CBG: Recent Labs  Lab 10/13/21 0321 10/13/21 0646 10/13/21 0834 10/13/21 1147 10/13/21 1543  GLUCAP 108* 100* 127* 138* 99    Lipid Profile: No results for input(s): CHOL, HDL, LDLCALC, TRIG, CHOLHDL, LDLDIRECT in the last 72 hours.  Thyroid Function Tests: No results for input(s): TSH, T4TOTAL, FREET4, T3FREE, THYROIDAB in the last 72 hours. Anemia Panel: No results for input(s): VITAMINB12, FOLATE, FERRITIN, TIBC, IRON, RETICCTPCT in the  last 72 hours. Sepsis Labs: Recent Labs  Lab 10/09/21 1710 10/09/21 1921 10/10/21 1051 10/10/21 1410 10/11/21 0601 10/12/21 0426  PROCALCITON  --   --   --  4.07 2.76 1.67  LATICACIDVEN 8.5* 3.4* 2.2* 1.7  --   --      Recent Results (from the past 240 hour(s))  Blood culture (routine x 2)     Status: None   Collection Time: 10/09/21  5:10 PM   Specimen: BLOOD  Result  Value Ref Range Status   Specimen Description BLOOD SITE NOT SPECIFIED  Final   Special Requests   Final    BOTTLES DRAWN AEROBIC ONLY Blood Culture results may not be optimal due to an inadequate volume of blood received in culture bottles   Culture   Final    NO GROWTH 5 DAYS Performed at [facility], [place] [address], [place], [place] [address]    Report Status 10/14/2021 FINAL  Final  Resp Panel by RT-PCR (Flu A&B, Covid) Nasopharyngeal Swab     Status: None   Collection Time: 10/09/21  7:09 PM   Specimen: Nasopharyngeal Swab; Nasopharyngeal(NP) swabs in vial transport medium  Result Value Ref Range Status   SARS Coronavirus 2 by RT PCR NEGATIVE NEGATIVE Final    Comment: (NOTE) SARS-CoV-2 target nucleic acids are NOT DETECTED.  The SARS-CoV-2 RNA is generally detectable in upper respiratory specimens during the acute phase of infection. The lowest concentration of SARS-CoV-2 viral copies this assay can detect is 138 copies/mL. A negative result does not preclude SARS-Cov-2 infection and should not be used as the sole basis for treatment or other patient management decisions. A negative result may occur with  improper specimen collection/handling, submission of specimen other than nasopharyngeal swab, presence of viral mutation(s) within the areas targeted by this assay, and inadequate number of viral copies(<138 copies/mL). A negative result must be combined with clinical observations, patient history, and epidemiological information. The expected result is Negative.  Fact Sheet for Patients:   EntrepreneurPulse.com.au  Fact Sheet for Healthcare Providers:  IncredibleEmployment.be  This test is no t yet approved or cleared by the [place] FDA and  has been authorized for detection and/or diagnosis of SARS-CoV-2 by FDA under an Emergency Use Authorization (EUA). This EUA will remain  in effect (meaning this test can be used) for the duration of the COVID-19 declaration under Section 564(b)(1) of the Act, 21 U.S.C.section 360bbb-3(b)(1), unless the authorization is terminated  or revoked sooner.       Influenza A by PCR NEGATIVE NEGATIVE Final   Influenza B by PCR NEGATIVE NEGATIVE Final    Comment: (NOTE) The Xpert Xpress SARS-CoV-2/FLU/RSV plus assay is intended as an aid in the diagnosis of influenza from Nasopharyngeal swab specimens and should not be used as a sole basis for treatment. Nasal washings and aspirates are unacceptable for Xpert Xpress SARS-CoV-2/FLU/RSV testing.  Fact Sheet for Patients: EntrepreneurPulse.com.au  Fact Sheet for Healthcare Providers: IncredibleEmployment.be  This test is not yet approved or cleared by the [place] FDA and has been authorized for detection and/or diagnosis of SARS-CoV-2 by FDA under an Emergency Use Authorization (EUA). This EUA will remain in effect (meaning this test can be used) for the duration of the COVID-19 declaration under Section 564(b)(1) of the Act, 21 U.S.C. section 360bbb-3(b)(1), unless the authorization is terminated or revoked.  Performed at [facility], [place] [address], [place], [place] [address]   Blood culture (routine x 2)     Status: None   Collection Time: 10/09/21  9:25 PM   Specimen: BLOOD  Result Value Ref Range Status   Specimen Description BLOOD RIGHT ANTECUBITAL  Final   Special Requests   Final    BOTTLES DRAWN AEROBIC AND ANAEROBIC Blood Culture adequate volume   Culture   Final    NO GROWTH 5  DAYS Performed at [facility], [place] [address] [place],  [address]    Report Status 10/14/2021 FINAL  Final  MRSA Next Gen by PCR, Nasal     Status: None   Collection Time: 10/10/21  5:27 AM   Specimen: Nasal Mucosa; Nasal Swab  Result Value Ref Range Status   MRSA by PCR Next Gen NOT DETECTED NOT DETECTED Final    Comment: (NOTE) The GeneXpert MRSA Assay (FDA approved for NASAL specimens only), is one component of a comprehensive MRSA colonization surveillance program. It is not intended to diagnose MRSA infection nor to guide or monitor treatment for MRSA infections. Test performance is not FDA approved in patients less than 17 years old. Performed at [facility], [place] [address] [place], [place] [address]   Respiratory (~20 pathogens) panel by PCR     Status: None   Collection Time: 10/10/21  5:27 AM   Specimen: Nasopharyngeal Swab; Respiratory  Result Value Ref Range Status   Adenovirus NOT DETECTED NOT DETECTED Final   Coronavirus 229E NOT DETECTED NOT DETECTED Final    Comment: (NOTE) The Coronavirus on the Respiratory Panel, DOES NOT test for the novel  Coronavirus (2019 nCoV)    Coronavirus HKU1 NOT DETECTED NOT DETECTED Final   Coronavirus NL63 NOT DETECTED NOT DETECTED Final   Coronavirus OC43 NOT DETECTED NOT DETECTED Final   Metapneumovirus NOT DETECTED NOT DETECTED Final   Rhinovirus / Enterovirus NOT DETECTED NOT DETECTED Final   Influenza A NOT DETECTED NOT DETECTED Final   Influenza B NOT DETECTED NOT DETECTED Final   Parainfluenza Virus 1 NOT DETECTED NOT DETECTED Final   Parainfluenza Virus 2 NOT DETECTED NOT DETECTED Final   Parainfluenza Virus 3 NOT DETECTED NOT DETECTED Final   Parainfluenza Virus 4 NOT DETECTED NOT DETECTED Final   Respiratory Syncytial Virus NOT DETECTED NOT DETECTED Final   Bordetella pertussis NOT DETECTED NOT DETECTED Final   Bordetella Parapertussis NOT DETECTED NOT DETECTED Final   Chlamydophila pneumoniae NOT  DETECTED NOT DETECTED Final   Mycoplasma pneumoniae NOT DETECTED NOT DETECTED Final    Comment: Performed at [facility], [place]. [address], [place], [place] [address]          Radiology Studies: No results found.      Scheduled Meds:  aspirin EC  81 mg Oral Daily   atorvastatin  40 mg Oral Daily   clopidogrel  75 mg Oral Daily   enoxaparin (LOVENOX) injection  40 mg Subcutaneous Q24H   furosemide  40 mg Oral BID   senna  1 tablet Oral Daily   sodium chloride flush  3 mL Intravenous Q12H   Continuous Infusions:   LOS: 7 days    Time spent: 35 minutes    Quanasia Defino A Ailah Barna, MD [facility]   If 7PM-7AM, please contact night-coverage [email or web address]  10/16/2021, 4:42 PM

## 2021-10-16 NOTE — Progress Notes (Signed)
Cross-coverage note:   Patient was seen for chest pain. He presented 10/16 with chest pain and SOB, was admitted with with sepsis d/t PNA with AHRF and NSTEMI, underwent LHC and found to have high-risk coronary anatomy and was not a candidate for CABG. His CAD is being managed medically.   He developed chest pain again tonight that he describes a pressure but denies any increased SOB. He was given NTG x3 and 5 mg oxycodone without relief. NTG ointment was placed and he was given 2 mg IV morphine but continued to complain of severe pain.   He does not appear to be in acute distress and there is no pallor or diaphoresis. He is in sinus tachycardia with rate 110s and SBP 99.    We discussed the situation with RN at bedside; he is not interested in further testing that is unlikely to change management and wants to focus on symptom management. He was given 0.5 mg IV Dilaudid with relief.

## 2021-10-17 ENCOUNTER — Telehealth: Payer: Self-pay

## 2021-10-17 ENCOUNTER — Inpatient Hospital Stay (HOSPITAL_COMMUNITY): Payer: Medicare Other

## 2021-10-17 ENCOUNTER — Other Ambulatory Visit: Payer: Self-pay

## 2021-10-17 DIAGNOSIS — E785 Hyperlipidemia, unspecified: Secondary | ICD-10-CM

## 2021-10-17 DIAGNOSIS — I214 Non-ST elevation (NSTEMI) myocardial infarction: Secondary | ICD-10-CM | POA: Diagnosis not present

## 2021-10-17 DIAGNOSIS — I10 Essential (primary) hypertension: Secondary | ICD-10-CM

## 2021-10-17 LAB — CBC
HCT: 39.9 % (ref 39.0–52.0)
Hemoglobin: 13.4 g/dL (ref 13.0–17.0)
MCH: 31.2 pg (ref 26.0–34.0)
MCHC: 33.6 g/dL (ref 30.0–36.0)
MCV: 93 fL (ref 80.0–100.0)
Platelets: 346 10*3/uL (ref 150–400)
RBC: 4.29 MIL/uL (ref 4.22–5.81)
RDW: 14 % (ref 11.5–15.5)
WBC: 42.3 10*3/uL — ABNORMAL HIGH (ref 4.0–10.5)
nRBC: 0 % (ref 0.0–0.2)

## 2021-10-17 LAB — BASIC METABOLIC PANEL
Anion gap: 12 (ref 5–15)
BUN: 41 mg/dL — ABNORMAL HIGH (ref 8–23)
CO2: 20 mmol/L — ABNORMAL LOW (ref 22–32)
Calcium: 8.7 mg/dL — ABNORMAL LOW (ref 8.9–10.3)
Chloride: 100 mmol/L (ref 98–111)
Creatinine, Ser: 1.35 mg/dL — ABNORMAL HIGH (ref 0.61–1.24)
GFR, Estimated: 49 mL/min — ABNORMAL LOW (ref >60)
Glucose, Bld: 194 mg/dL — ABNORMAL HIGH (ref 70–99)
Potassium: 4 mmol/L (ref 3.5–5.1)
Sodium: 132 mmol/L — ABNORMAL LOW (ref 135–145)

## 2021-10-17 LAB — CBC WITH DIFFERENTIAL/PLATELET
Abs Immature Granulocytes: 0 10*3/uL (ref 0.00–0.07)
Basophils Absolute: 0 10*3/uL (ref 0.0–0.1)
Basophils Relative: 0 %
Eosinophils Absolute: 0 10*3/uL (ref 0.0–0.5)
Eosinophils Relative: 0 %
HCT: 38.6 % — ABNORMAL LOW (ref 39.0–52.0)
Hemoglobin: 12.8 g/dL — ABNORMAL LOW (ref 13.0–17.0)
Lymphocytes Relative: 5 %
Lymphs Abs: 1.7 10*3/uL (ref 0.7–4.0)
MCH: 31.1 pg (ref 26.0–34.0)
MCHC: 33.2 g/dL (ref 30.0–36.0)
MCV: 93.7 fL (ref 80.0–100.0)
Monocytes Absolute: 2.3 10*3/uL — ABNORMAL HIGH (ref 0.1–1.0)
Monocytes Relative: 7 %
Neutro Abs: 29 10*3/uL — ABNORMAL HIGH (ref 1.7–7.7)
Neutrophils Relative %: 88 %
Platelets: 334 10*3/uL (ref 150–400)
RBC: 4.12 MIL/uL — ABNORMAL LOW (ref 4.22–5.81)
RDW: 13.9 % (ref 11.5–15.5)
WBC: 33 10*3/uL — ABNORMAL HIGH (ref 4.0–10.5)
nRBC: 0 % (ref 0.0–0.2)
nRBC: 0 /100 WBC

## 2021-10-17 MED ORDER — SODIUM CHLORIDE 0.9 % IV SOLN
3.0000 g | Freq: Two times a day (BID) | INTRAVENOUS | Status: DC
  Administered 2021-10-17 – 2021-10-20 (×6): 3 g via INTRAVENOUS
  Filled 2021-10-17 (×9): qty 8

## 2021-10-17 MED ORDER — FUROSEMIDE 40 MG PO TABS
40.0000 mg | ORAL_TABLET | Freq: Every day | ORAL | Status: DC
  Administered 2021-10-17 – 2021-10-18 (×2): 40 mg via ORAL
  Filled 2021-10-17 (×2): qty 1

## 2021-10-17 MED ORDER — HYDROCODONE-ACETAMINOPHEN 5-325 MG PO TABS
1.0000 | ORAL_TABLET | Freq: Four times a day (QID) | ORAL | Status: DC | PRN
  Administered 2021-10-17: 1 via ORAL
  Filled 2021-10-17: qty 1

## 2021-10-17 MED ORDER — METOPROLOL TARTRATE 12.5 MG HALF TABLET
12.5000 mg | ORAL_TABLET | Freq: Two times a day (BID) | ORAL | Status: DC
  Administered 2021-10-18: 12.5 mg via ORAL
  Filled 2021-10-17 (×2): qty 1

## 2021-10-17 MED ORDER — MORPHINE SULFATE (PF) 2 MG/ML IV SOLN
0.5000 mg | INTRAVENOUS | Status: DC | PRN
  Filled 2021-10-17: qty 1

## 2021-10-17 NOTE — Progress Notes (Signed)
PROGRESS NOTE    Nicholas Finley  OMB:[id number] DOB: 1926/09/21 DOA: 10/09/2021 PCP: [name], MD   Brief Narrative: 87 year old with past medical history significant for hypertension, HLD, who presented with worsening chest pain and shortness of breath over the last 2 days prior to admission.  He also reported worsening dyspnea over several weeks.  On EMS arrival patient was found hypoxic oxygen saturation in the 60 and EKG with concern for ST depression in the inferior and lateral leads.  Patient was admitted with non-STEMI, he was also placed on BiPAP with some improvement of his respiratory distress, he was a started on nitroglycerin drip, subsequently nitroglycerin is stopped due to low blood pressure.  Patient was also started on a heparin drip.  Patient underwent heart cath which showed critical ostial left main stenosis which was heavily calcified, moderate proximal LAD and circumflex stenosis.  Severe aortic stenosis.  Patient does not wish to proceed with high risk procedure.  He was made DNR.  Palliative care has been consulted for further goals of care and help with disposition.   Assessment & Plan:   Active Problems:   Chest pain   Sepsis with acute hypoxic respiratory failure and septic shock ([place])   Community acquired pneumonia   Respiratory failure ([place])   Pulmonary edema   Nonrheumatic aortic valve stenosis   1-Acute hypoxic respiratory failure secondary to diffuse pulmonary edema and or pneumonia: Patient improved with BiPAP.  Currently off of BiPAP. He received IV lasix.  Completed antibiotics.  -On oral  lasix.   2-NSTEMI, Acute systolic heart failure exacerbation, Aortic stenosis  -Cath: which showed critical ostial left main stenosis which was heavily calcified, moderate proximal LAD and circumflex stenosis.  Severe aortic stenosis. -Patient does not  wishes to proceed to high risk procedure. -Medical management.  He received IV heparin drip, Plavix,  statins. -Develop chest pain last night, and had episode this am. Received nitro and dilaudid.  -Cardiology to see for further medical management Imdur ?   Leukocytosis: Related to aspiration versus reactive: received antibiotics.  WBC increase to 30, plan to repeat Chest x ray. Might need to resume IV antibiotics depending on x ray.   Hypokalemia: Replaced.  Transaminases: Likely hepatic congestion from heart failure.     Estimated body mass index is 22.33 kg/m as calculated from the following:   Height as of 09/07/21: 5' 5.5" (1.664 m).   Weight as of this encounter: 61.8 kg.   DVT prophylaxis: Lovenox Code Status: DNR Disposition Plan:  Status is: Inpatient  Remains inpatient appropriate because: awaiting safe discharge plan.  Patient will benefit for SNF with hospice care.         Consultants:  Cardiology   Procedures:  Cath  Antimicrobials:  Augmentin   Subjective: He report chest pain this am. 4/10. He didn't want IV morphine. I order vicodin.   Objective: Vitals:   10/16/21 2249 10/17/21 0021 10/17/21 0335 10/17/21 1115  BP: 105/70 102/65 97/61 111/67  Pulse: (!) 114 95 97 91  Resp: 20   18  Temp:   (!) 97.3 F (36.3 C) 98.2 F (36.8 C)  TempSrc:   Oral Oral  SpO2:   90% 97%  Weight:   61.8 kg     Intake/Output Summary (Last 24 hours) at 10/17/2021 1453 Last data filed at 10/17/2021 1232 Gross per 24 hour  Intake 890 ml  Output 300 ml  Net 590 ml    Filed Weights   10/15/21 0130 10/16/21  3300 10/17/21 0335  Weight: 61.2 kg 61 kg 61.8 kg    Examination:  General exam: NAD Respiratory system: CTA Cardiovascular system: S 1, S 2 RRR Gastrointestinal system: BS present, soft, nt,  Central nervous system: alert Extremities: Symmetric power     Data Reviewed: I have personally reviewed following labs and imaging studies  CBC: Recent Labs  Lab 10/14/21 0332 10/15/21 0243 10/16/21 0251 10/17/21 0422 10/17/21 0942  WBC 12.2*  12.9* 17.6* 42.3* 33.0*  NEUTROABS  --   --   --   --  29.0*  HGB 12.9* 12.4* 13.1 13.4 12.8*  HCT 39.4 37.2* 39.6 39.9 38.6*  MCV 94.3 92.8 93.8 93.0 93.7  PLT 251 274 306 346 762    Basic Metabolic Panel: Recent Labs  Lab 10/13/21 0409 10/14/21 0650 10/15/21 0243 10/16/21 0251 10/17/21 0422  NA 137 136 135 136 132*  K 4.0 3.9 3.8 3.6 4.0  CL 102 101 103 102 100  CO2 24 25 23 22  20*  GLUCOSE 106* 105* 109* 110* 194*  BUN 34* 31* 30* 24* 41*  CREATININE 0.94 0.81 0.83 0.79 1.35*  CALCIUM 8.9 8.9 8.7* 8.9 8.7*  MG 2.0  --   --   --   --     GFR: Estimated Creatinine Clearance: 29.9 mL/min (A) (by C-G formula based on SCr of 1.35 mg/dL (H)). Liver Function Tests: Recent Labs  Lab 10/14/21 0650  AST 34  ALT 35  ALKPHOS 53  BILITOT 2.2*  PROT 6.7  ALBUMIN 3.1*    No results for input(s): LIPASE, AMYLASE in the last 168 hours. No results for input(s): AMMONIA in the last 168 hours. Coagulation Profile: No results for input(s): INR, PROTIME in the last 168 hours. Cardiac Enzymes: No results for input(s): CKTOTAL, CKMB, CKMBINDEX, TROPONINI in the last 168 hours. BNP (last 3 results) No results for input(s): PROBNP in the last 8760 hours. HbA1C: No results for input(s): HGBA1C in the last 72 hours.  CBG: Recent Labs  Lab 10/13/21 0321 10/13/21 0646 10/13/21 0834 10/13/21 1147 10/13/21 1543  GLUCAP 108* 100* 127* 138* 99    Lipid Profile: No results for input(s): CHOL, HDL, LDLCALC, TRIG, CHOLHDL, LDLDIRECT in the last 72 hours.  Thyroid Function Tests: No results for input(s): TSH, T4TOTAL, FREET4, T3FREE, THYROIDAB in the last 72 hours. Anemia Panel: No results for input(s): VITAMINB12, FOLATE, FERRITIN, TIBC, IRON, RETICCTPCT in the last 72 hours. Sepsis Labs: Recent Labs  Lab 10/11/21 0601 10/12/21 0426  PROCALCITON 2.76 1.67     Recent Results (from the past 240 hour(s))  Blood culture (routine x 2)     Status: None   Collection Time:  10/09/21  5:10 PM   Specimen: BLOOD  Result Value Ref Range Status   Specimen Description BLOOD SITE NOT SPECIFIED  Final   Special Requests   Final    BOTTLES DRAWN AEROBIC ONLY Blood Culture results may not be optimal due to an inadequate volume of blood received in culture bottles   Culture   Final    NO GROWTH 5 DAYS Performed at [facility], [place] [address] [place], [place] [address]    Report Status 10/14/2021 FINAL  Final  Resp Panel by RT-PCR (Flu A&B, Covid) Nasopharyngeal Swab     Status: None   Collection Time: 10/09/21  7:09 PM   Specimen: Nasopharyngeal Swab; Nasopharyngeal(NP) swabs in vial transport medium  Result Value Ref Range Status   SARS Coronavirus 2 by RT PCR  NEGATIVE NEGATIVE Final    Comment: (NOTE) SARS-CoV-2 target nucleic acids are NOT DETECTED.  The SARS-CoV-2 RNA is generally detectable in upper respiratory specimens during the acute phase of infection. The lowest concentration of SARS-CoV-2 viral copies this assay can detect is 138 copies/mL. A negative result does not preclude SARS-Cov-2 infection and should not be used as the sole basis for treatment or other patient management decisions. A negative result may occur with  improper specimen collection/handling, submission of specimen other than nasopharyngeal swab, presence of viral mutation(s) within the areas targeted by this assay, and inadequate number of viral copies(<138 copies/mL). A negative result must be combined with clinical observations, patient history, and epidemiological information. The expected result is Negative.  Fact Sheet for Patients:  EntrepreneurPulse.com.au  Fact Sheet for Healthcare Providers:  IncredibleEmployment.be  This test is no t yet approved or cleared by the [place] FDA and  has been authorized for detection and/or diagnosis of SARS-CoV-2 by FDA under an Emergency Use Authorization (EUA). This EUA will remain   in effect (meaning this test can be used) for the duration of the COVID-19 declaration under Section 564(b)(1) of the Act, 21 U.S.C.section 360bbb-3(b)(1), unless the authorization is terminated  or revoked sooner.       Influenza A by PCR NEGATIVE NEGATIVE Final   Influenza B by PCR NEGATIVE NEGATIVE Final    Comment: (NOTE) The Xpert Xpress SARS-CoV-2/FLU/RSV plus assay is intended as an aid in the diagnosis of influenza from Nasopharyngeal swab specimens and should not be used as a sole basis for treatment. Nasal washings and aspirates are unacceptable for Xpert Xpress SARS-CoV-2/FLU/RSV testing.  Fact Sheet for Patients: EntrepreneurPulse.com.au  Fact Sheet for Healthcare Providers: IncredibleEmployment.be  This test is not yet approved or cleared by the [place] FDA and has been authorized for detection and/or diagnosis of SARS-CoV-2 by FDA under an Emergency Use Authorization (EUA). This EUA will remain in effect (meaning this test can be used) for the duration of the COVID-19 declaration under Section 564(b)(1) of the Act, 21 U.S.C. section 360bbb-3(b)(1), unless the authorization is terminated or revoked.  Performed at [facility], [place] [address], [place], [place] [address]   Blood culture (routine x 2)     Status: None   Collection Time: 10/09/21  9:25 PM   Specimen: BLOOD  Result Value Ref Range Status   Specimen Description BLOOD RIGHT ANTECUBITAL  Final   Special Requests   Final    BOTTLES DRAWN AEROBIC AND ANAEROBIC Blood Culture adequate volume   Culture   Final    NO GROWTH 5 DAYS Performed at [facility], [place] [address], [place], [place] [address]    Report Status 10/14/2021 FINAL  Final  MRSA Next Gen by PCR, Nasal     Status: None   Collection Time: 10/10/21  5:27 AM   Specimen: Nasal Mucosa; Nasal Swab  Result Value Ref Range Status   MRSA by PCR Next Gen NOT DETECTED NOT DETECTED Final     Comment: (NOTE) The GeneXpert MRSA Assay (FDA approved for NASAL specimens only), is one component of a comprehensive MRSA colonization surveillance program. It is not intended to diagnose MRSA infection nor to guide or monitor treatment for MRSA infections. Test performance is not FDA approved in patients less than 69 years old. Performed at [facility], [place] [address] [place], [place] [address]   Respiratory (~20 pathogens) panel by PCR     Status: None  Collection Time: 10/10/21  5:27 AM   Specimen: Nasopharyngeal Swab; Respiratory  Result Value Ref Range Status   Adenovirus NOT DETECTED NOT DETECTED Final   Coronavirus 229E NOT DETECTED NOT DETECTED Final    Comment: (NOTE) The Coronavirus on the Respiratory Panel, DOES NOT test for the novel  Coronavirus (2019 nCoV)    Coronavirus HKU1 NOT DETECTED NOT DETECTED Final   Coronavirus NL63 NOT DETECTED NOT DETECTED Final   Coronavirus OC43 NOT DETECTED NOT DETECTED Final   Metapneumovirus NOT DETECTED NOT DETECTED Final   Rhinovirus / Enterovirus NOT DETECTED NOT DETECTED Final   Influenza A NOT DETECTED NOT DETECTED Final   Influenza B NOT DETECTED NOT DETECTED Final   Parainfluenza Virus 1 NOT DETECTED NOT DETECTED Final   Parainfluenza Virus 2 NOT DETECTED NOT DETECTED Final   Parainfluenza Virus 3 NOT DETECTED NOT DETECTED Final   Parainfluenza Virus 4 NOT DETECTED NOT DETECTED Final   Respiratory Syncytial Virus NOT DETECTED NOT DETECTED Final   Bordetella pertussis NOT DETECTED NOT DETECTED Final   Bordetella Parapertussis NOT DETECTED NOT DETECTED Final   Chlamydophila pneumoniae NOT DETECTED NOT DETECTED Final   Mycoplasma pneumoniae NOT DETECTED NOT DETECTED Final    Comment: Performed at [facility], [address] [place], [place] [address]          Radiology Studies: No results found.      Scheduled Meds:  aspirin EC  81 mg Oral Daily   atorvastatin  40 mg Oral Daily   clopidogrel  75  mg Oral Daily   enoxaparin (LOVENOX) injection  40 mg Subcutaneous Q24H   furosemide  40 mg Oral Daily   senna  1 tablet Oral Daily   sodium chloride flush  3 mL Intravenous Q12H   Continuous Infusions:   LOS: 8 days    Time spent: 35 minutes    Dekisha Mesmer A Laurella Tull, MD [facility]   If 7PM-7AM, please contact night-coverage [email or web address]  10/17/2021, 2:53 PM

## 2021-10-17 NOTE — Telephone Encounter (Signed)
**Note De-Identified  Obfuscation** The pt is currently in [facility]. We will monitor his chart and will call him once he has been discharged.

## 2021-10-17 NOTE — Care Management Important Message (Signed)
Important Message  Patient Details  Name: Graesyn Schreifels MRN: [id number] Date of Birth: November 29, 1926   Medicare Important Message Given:  Yes     Shelda Altes 10/17/2021, 10:20 AM

## 2021-10-17 NOTE — Consult Note (Signed)
[facility] Inpatient Consult   10/17/2021  Nicholas Finley 1926-05-22 [id number]  Thank you for your referral request:  [facility] [ACO] Patient: Medicare  Primary Care Provider:  [name], MD, [facility], [facility], Embedded   Patient evaluated for community based chronic complex disease management services with [facility] as a benefit of patient's [employer]. Met with the patient and Pilar Plate at the bedside to discuss referral to [place] [facility].  Attempts to speak with patient x 3 due to his care needs, will return after xray completed. Pilar Plate given contact information.  Received a call from [name], explained that this writer needed to get permission from patient to speak with her.   Returned to patient's room.  Pilar Plate remains at bedside with patient's permission.   Explained that the patient qualifies for complex disease management as a benefit and that his primary care provider has a Chronic Care Management program with Nursing, Social Work and Pharmacy. Patient denies having food resource needs, discussed other options of ordering food to be delivered as he did not want MOMs meals.  He states, "I just have to make several trips from my [employer] to get my groceries in but I make my own food." Chart review reveals patient is being recommended for home with home health. Explained to patient concerns for a safe transition home is the concern of the physician and team.  He states, "I have been living alone for the past 60 years without any major problems and there has been at least 20 years since I have had any type of driving incident."  Expressed safety issues for driving while weak and states, "My right foot and leg is having issues feeling the pedal as well but, like I said I only go 2 miles to [employer] and [place] here could help with that as well. I won't know what I really need til  I get home and see."    Patient given a brochure and 24 hour nurse advise line magnet. Patient gave permission to speak with [name] and inquired of him if he could stay with friends or family.  Patient states that he wouldn't do that because people have their families and he has been living alone for the past 60 years.  Call made to [name] at [phone] - [phone], HIPAA verified. Susan's concerns for the patient is for him to get the assistance needed.  Explained that the inpatient team will work on home health as a recommendation from PT.  [PT notes reviewed patient walked 450 feet with straight cane.] However, discussion for possible caregivers were discussed and patient had agreed that he would need Susan's assistance with this. He states he could afford it. Explained to her that patient could look assisted living but he state he would only need that for a couple of weeks. Explained patient can be referred to his primary care provider that has a chronic care management team with nursing, social work, and pharmacist available.  Plan: Updated inpatient [facility] RN regarding post hospital needs for home health and referral for Embedded team. Request home health social work to follow until Embedded Education officer, museum can work with him, if needed.    Of note, [facility] Care Management services does not replace or interfere with any services that are arranged by inpatient case management or social work.  For additional questions or referrals please contact:    Natividad Brood, RN BSN  [facility] Liaison  [phone] business mobile phone Toll free office [phone]  Fax number: [phone] [email or web address]  [email or web address]

## 2021-10-17 NOTE — Plan of Care (Signed)
  Problem: Education: Goal: Knowledge of General Education information will improve Description: Including pain rating scale, medication(s)/side effects and non-pharmacologic comfort measures 10/17/2021 1504 by Salina April, RN Outcome: Progressing 10/17/2021 1504 by Salina April, RN Outcome: Progressing   Problem: Health Behavior/Discharge Planning: Goal: Ability to manage health-related needs will improve 10/17/2021 1504 by Salina April, RN Outcome: Progressing 10/17/2021 1504 by Salina April, RN Outcome: Progressing   Problem: Clinical Measurements: Goal: Ability to maintain clinical measurements within normal limits will improve 10/17/2021 1504 by Salina April, RN Outcome: Progressing 10/17/2021 1504 by Salina April, RN Outcome: Progressing Goal: Will remain free from infection 10/17/2021 1504 by Salina April, RN Outcome: Progressing 10/17/2021 1504 by Salina April, RN Outcome: Progressing Goal: Diagnostic test results will improve 10/17/2021 1504 by Salina April, RN Outcome: Progressing 10/17/2021 1504 by Salina April, RN Outcome: Progressing Goal: Respiratory complications will improve 10/17/2021 1504 by Salina April, RN Outcome: Progressing 10/17/2021 1504 by Salina April, RN Outcome: Progressing Goal: Cardiovascular complication will be avoided 10/17/2021 1504 by Salina April, RN Outcome: Progressing 10/17/2021 1504 by Salina April, RN Outcome: Progressing   Problem: Activity: Goal: Risk for activity intolerance will decrease 10/17/2021 1504 by Salina April, RN Outcome: Progressing 10/17/2021 1504 by Salina April, RN Outcome: Progressing   Problem: Nutrition: Goal: Adequate nutrition will be maintained 10/17/2021 1504 by Salina April, RN Outcome: Progressing 10/17/2021 1504 by Salina April, RN Outcome: Progressing   Problem: Coping: Goal: Level of anxiety will decrease 10/17/2021 1504 by Salina April, RN Outcome:  Progressing 10/17/2021 1504 by Salina April, RN Outcome: Progressing   Problem: Elimination: Goal: Will not experience complications related to bowel motility 10/17/2021 1504 by Salina April, RN Outcome: Progressing 10/17/2021 1504 by Salina April, RN Outcome: Progressing Goal: Will not experience complications related to urinary retention 10/17/2021 1504 by Salina April, RN Outcome: Progressing 10/17/2021 1504 by Salina April, RN Outcome: Progressing   Problem: Pain Managment: Goal: General experience of comfort will improve 10/17/2021 1504 by Salina April, RN Outcome: Progressing 10/17/2021 1504 by Salina April, RN Outcome: Progressing   Problem: Safety: Goal: Ability to remain free from injury will improve 10/17/2021 1504 by Salina April, RN Outcome: Progressing 10/17/2021 1504 by Salina April, RN Outcome: Progressing   Problem: Skin Integrity: Goal: Risk for impaired skin integrity will decrease 10/17/2021 1504 by Salina April, RN Outcome: Progressing 10/17/2021 1504 by Salina April, RN Outcome: Progressing

## 2021-10-17 NOTE — Progress Notes (Signed)
Physical Therapy Treatment Patient Details Name: Nicholas Finley MRN: [id number] DOB: June 20, 1926 Today's Date: 10/17/2021   History of Present Illness Pt is a 87 y.o. male admitted 10/09/21 with worsening chest pain and SOB. Workup for NSTEMI, acute respiratory failure. PMH includes HTN, heart murmur, sepsis, cataract.    PT Comments    The pt was able to complete good session of hallway mobility with addition of balance challenges to mimic mobility in home environment. The pt was able to maintain with mostly minG and use of SPC but did have single minor LOB (no assist needed to correct). He also demos increased instability with addition of challenges such as head turns, narrow BOS, or quick direction changes. Pt educated at length on fall risk reduction in the home and modification of activities to improve safety after d/c. Expressed verbal understanding, but will continue to benefit from skilled PT acutely and after d/c to maximize functional strength, stability, and reduce risk of falls.   Dynamic Gait Index (DGI): 13/24 (<19 indicates increased risk for falls)   Recommendations for follow up therapy are one component of a multi-disciplinary discharge planning process, led by the attending physician.  Recommendations may be updated based on patient status, additional functional criteria and insurance authorization.  Follow Up Recommendations  Home health PT     Assistance Recommended at Discharge Intermittent Supervision/Assistance  Equipment Recommendations  None recommended by PT    Recommendations for Other Services       Precautions / Restrictions Precautions Precautions: Fall Restrictions Weight Bearing Restrictions: No     Mobility  Bed Mobility Overal bed mobility: Independent             General bed mobility comments: no assist needed, good stability    Transfers Overall transfer level: Needs assistance Equipment used: Straight cane Transfers: Sit  to/from Stand Sit to Stand: Supervision           General transfer comment: supervision for safety, no assist needed    Ambulation/Gait Ambulation/Gait assistance: Min guard;Min assist Gait Distance (Feet): 400 Feet Assistive device: Straight cane Gait Pattern/deviations: Step-through pattern;Decreased stride length;Wide base of support Gait velocity: 0.5 m/s Gait velocity interpretation: <1.31 ft/sec, indicative of household ambulator General Gait Details: Slow, mostly steady gait with SPC that pt does not consistently use; initial min guard for balance progressing to supervision; no overt instability or LOB with direction changes, stopping, head turns     Balance Overall balance assessment: Needs assistance Sitting-balance support: Feet supported Sitting balance-Leahy Scale: Good     Standing balance support: Single extremity supported Standing balance-Leahy Scale: Fair Standing balance comment: Can static stand and walk without UE support; static and dynamic stability improved with cane         [place] - Eyes Opened: 8 [place] - Eyes Closed: 2 High level balance activites: Sudden stops;Turns;Direction changes;Head turns High Level Balance Comments: increased challenge with head turns and stepping over objects, pt was able to pick up object from floor wihtout issue Standardized Balance Assessment Standardized Balance Assessment : Dynamic Gait Index   Dynamic Gait Index Level Surface: Mild Impairment Change in Gait Speed: Mild Impairment Gait with Horizontal Head Turns: Moderate Impairment Gait with Vertical Head Turns: Moderate Impairment Gait and Pivot Turn: Mild Impairment Step Over Obstacle: Mild Impairment Step Around Obstacles: Mild Impairment Steps: Moderate Impairment Total Score: 13      Cognition Arousal/Alertness: Awake/alert Behavior During Therapy: WFL for tasks assessed/performed Overall Cognitive Status: Impaired/Different from baseline Area  of Impairment: Safety/judgement;Problem  solving                         Safety/Judgement: Decreased awareness of safety;Decreased awareness of deficits   Problem Solving: Requires verbal cues General Comments: WFL for simple tasks, decreased insight to need for assistance or increased risk of falls at home following hospitalization. pt talking about how he walked "less than 2 miles" to [employer] for meals and plans to do this again at d/c        Exercises      General Comments General comments (skin integrity, edema, etc.): HR to 113bpm      Pertinent Vitals/Pain Pain Assessment: No/denies pain     PT Goals (current goals can now be found in the care plan section) Acute Rehab PT Goals Patient Stated Goal: I'm going home PT Goal Formulation: With patient Time For Goal Achievement: 11/01/2021 Potential to Achieve Goals: Fair Progress towards PT goals: Progressing toward goals    Frequency    Min 3X/week      PT Plan Current plan remains appropriate       AM-PAC PT "6 Clicks" Mobility   Outcome Measure  Help needed turning from your back to your side while in a flat bed without using bedrails?: None Help needed moving from lying on your back to sitting on the side of a flat bed without using bedrails?: None Help needed moving to and from a bed to a chair (including a wheelchair)?: A Little Help needed standing up from a chair using your arms (e.g., wheelchair or bedside chair)?: A Little Help needed to walk in hospital room?: A Little Help needed climbing 3-5 steps with a railing? : A Little 6 Click Score: 20    End of Session Equipment Utilized During Treatment: Gait belt Activity Tolerance: Patient tolerated treatment well Patient left: in bed;with call bell/phone within reach;with bed alarm set Nurse Communication: Mobility status PT Visit Diagnosis: Other abnormalities of gait and mobility (R26.89);Muscle weakness (generalized) (M62.81)      Time: 8295-6213 PT Time Calculation (min) (ACUTE ONLY): 54 min  Charges:  $Gait Training: 23-37 mins $Therapeutic Activity: 8-22 mins $Self Care/Home Management: 8-22                     West Carbo, PT, DPT   Acute Rehabilitation Department Pager #: [phone]   Sandra Cockayne 10/17/2021, 6:51 PM

## 2021-10-17 NOTE — Progress Notes (Addendum)
Progress Note  Patient Name: Nicholas Finley Date of Encounter: 10/17/2021  [facility] Cardiologist: Larae Grooms, MD   Subjective   Patient had recurrent chest pain overnight that did not resolve with Nitro x3, Oxycodone, or Morphine. Ultimately required Dilaudid with relief. Patient notes very minimal chest tightness this morning. No shortness of breath.  Inpatient Medications    Scheduled Meds:  aspirin EC  81 mg Oral Daily   atorvastatin  40 mg Oral Daily   clopidogrel  75 mg Oral Daily   enoxaparin (LOVENOX) injection  40 mg Subcutaneous Q24H   furosemide  40 mg Oral Daily   senna  1 tablet Oral Daily   sodium chloride flush  3 mL Intravenous Q12H   Continuous Infusions:  PRN Meds: docusate sodium, HYDROcodone-acetaminophen, morphine injection, nitroGLYCERIN   Vital Signs    Vitals:   10/16/21 2119 10/16/21 2249 10/17/21 0021 10/17/21 0335  BP: 99/72 105/70 102/65 97/61  Pulse: (!) 112 (!) 114 95 97  Resp:  20    Temp:    (!) 97.3 F (36.3 C)  TempSrc:    Oral  SpO2: 94%   90%  Weight:    61.8 kg    Intake/Output Summary (Last 24 hours) at 10/17/2021 1102 Last data filed at 10/17/2021 0839 Gross per 24 hour  Intake 800 ml  Output 300 ml  Net 500 ml   Last 3 Weights 10/17/2021 10/16/2021 10/15/2021  Weight (lbs) 136 lb 3.9 oz 134 lb 6.4 oz 134 lb 14.4 oz  Weight (kg) 61.8 kg 60.963 kg 61.19 kg      Telemetry    Sinus rhythm with rates in the 90s. A few short runs of NSVT and one 16 beats run of an accelerated ventricular rhythm in the 90s. - Personally Reviewed  ECG    No new ECG tracing today. - Personally Reviewed  Physical Exam   GEN: No acute distress.   Neck: No JVD. Cardiac: RRR. III/VI systolic murmur. No rubs or gallops.no murmurs. Respiratory: Diminished breath sounds in bilateral bases. No wheezes, rhonchi, or rales appreciated. GI: Soft, non-distended, and non-tender.  MS: No lower extremity edema. No deformity. Neuro:   No focal deficits. Psych: Normal affect. Responds appropriately.  Labs    High Sensitivity Troponin:   Recent Labs  Lab 10/09/21 1710 10/09/21 1921 10/10/21 0500 10/11/21 0601  TROPONINIHS 155* 1,071* 20,610* 7,652*     Chemistry Recent Labs  Lab 10/13/21 0409 10/14/21 0650 10/15/21 0243 10/16/21 0251 10/17/21 0422  NA 137 136 135 136 132*  K 4.0 3.9 3.8 3.6 4.0  CL 102 101 103 102 100  CO2 24 25 23 22  20*  GLUCOSE 106* 105* 109* 110* 194*  BUN 34* 31* 30* 24* 41*  CREATININE 0.94 0.81 0.83 0.79 1.35*  CALCIUM 8.9 8.9 8.7* 8.9 8.7*  MG 2.0  --   --   --   --   PROT  --  6.7  --   --   --   ALBUMIN  --  3.1*  --   --   --   AST  --  34  --   --   --   ALT  --  35  --   --   --   ALKPHOS  --  53  --   --   --   BILITOT  --  2.2*  --   --   --   GFRNONAA >60 >60 >60 >60 49*  ANIONGAP 11  10 9 12 12     Lipids  Recent Labs  Lab 10/11/21 0746  CHOL 128  TRIG 83  HDL 56  LDLCALC 55  CHOLHDL 2.3    Hematology Recent Labs  Lab 10/16/21 0251 10/17/21 0422 10/17/21 0942  WBC 17.6* 42.3* 33.0*  RBC 4.22 4.29 4.12*  HGB 13.1 13.4 12.8*  HCT 39.6 39.9 38.6*  MCV 93.8 93.0 93.7  MCH 31.0 31.2 31.1  MCHC 33.1 33.6 33.2  RDW 14.0 14.0 13.9  PLT 306 346 334   Thyroid No results for input(s): TSH, FREET4 in the last 168 hours.  BNP Recent Labs  Lab 10/10/21 1410 10/11/21 0601  BNP 1,802.6* 945.1*    DDimer No results for input(s): DDIMER in the last 168 hours.   Radiology    No results found.  Cardiac Studies   Echocardiogram 10/10/2021: Impressions:  1. Left ventricular ejection fraction, by estimation, is 40 to 45%. The  left ventricle has mildly decreased function. The left ventricle  demonstrates regional wall motion abnormalities (see scoring  diagram/findings for description). Left ventricular  diastolic parameters are consistent with Grade I diastolic dysfunction  (impaired relaxation).   2. Right ventricular systolic function is normal.  The right ventricular  size is normal.   3. The mitral valve is normal in structure. No evidence of mitral valve  regurgitation. No evidence of mitral stenosis.   4. The aortic valve is calcified. There is moderate calcification of the  aortic valve. There is moderate thickening of the aortic valve. Aortic  valve regurgitation is moderate. Moderate aortic valve stenosis. Aortic  valve mean gradient measures 25.2  mmHg. Aortic valve Vmax measures 3.16 m/s.   5. The inferior vena cava is dilated in size with <50% respiratory  variability, suggesting right atrial pressure of 15 mmHg. _______________   Lower Extremity Dopplers 10/10/2021: Summary: - No evidence of deep vein thrombosis seen in the lower extremities,  bilaterally.  -No evidence of popliteal cyst, bilaterally.  _______________   Right/Left Cardiac Catheterization 10/11/2021:   Colon Flattery LM lesion is 95% stenosed.   Prox LAD to Mid LAD lesion is 70% stenosed.   Ost Cx to Prox Cx lesion is 80% stenosed.   LV end diastolic pressure is mildly elevated.   Hemodynamic findings consistent with mild pulmonary hypertension.   There is severe aortic valve stenosis.   Critical ostial left  main stenosis - heavily calcified Moderate proximal LAD and LCx stenosis also heavily calcified Severe aortic stenosis. Mean gradient 28 mm Hg with AVA 0.72 cm squared, index 0.41 Elevated LV filling pressures - mean PCWP 24 mm Hg Mild pulmonary HTN. Mean PAP 33 mm Hg Low cardiac output 3.49 L/min with index 1.97.   Plan: patient has very high risk anatomy with critical ostial Left main stenosis and severe AS. Will continue IV diuresis. Add inotropic support with IV milrinone. Will need heart team approach to review possible treatment options.    Diagnostic Dominance: Right    Patient Profile     87 y.o. male with a history of TIA, hypertension, and hyperlipidemia who was admitted with NSTEMI and acute hypoxic respiratory failure on 10/09/2021  after presenting with chest pain and severe shortness of breath. Work-up showed critical ostial left main stenosis and severe aortic stenosis. He is felt ot be high risk for any percutaneous intervention and not a candidate for surgical management. Therefore, plan is for conservative management. Palliative Care following.  Assessment & Plan    NSTEMI Severe Aortic Stenosis  -  High-sensitivity troponin peaked at 20,610.  - Echo showed LVEF of 40-45% with akinesis of the apical lateral segment, mid inferoseptal segment, apical septal segment, and apex as well as grade 1 diastolic dysfunction and moderate AS. - R/LHC on 10/18 showed critical left main heavily calcified stenosis, 70% stenosis of the proximal to mid LAD, and 80% stenosis of the ostial to proximal LCX, severe aortic stenosis, and low cardiac output of 3.49 L/min.  - Not a candidate for bypass surgery/AVR and would be at high risk for periprocedural complication and death if percutaneous intervention was attempted. Therefore, medical therapy and palliative care recommended. Palliative care is following. - Patient had recurrent chest pain last night that did not resolve with Nitro x3, Oxycodone, or Morphine. Ultimately required Dilaudid with relief. Only very minimal chest tightness this morning. - Continue DAPT with Aspirin and Plavix as well as high-intensity statin. - Unable to add antianginals given soft BP. He is likely going to continue to have intermittent chest pain. Unfortunately, options are very limited. Can continue to use PRN narcotics for chest pain. Currently has PRN Vicodin and PRN Morphine ordered.    Acute Systolic CHF Ischemic Cardiomyopathy Low Output Cardiomyopathy - BNP 625 >> 1,802 >> 945. - Echo as above.  - R/LHC showed elevated LV filling pressures with mean PCWP of 24 mmHg and low cardiac output of 3.49 L/min with index of 1.97. - Started on Milrinone which was able to be stopped on 10/19. Initially diuresed  with IV Lasix but has now been switched to PO Lasix. Net negative 4.79 L this admission. Weight 136 lbs today, down from 149 lbs on admission. Renal function stable. - Continue current dose of Lasix 40mg  daily. - Unable to add GDMT due to soft BP. Want to avoid hypotension in setting of severe AS. - Continue to monitor volume status closely.    Hypertension - History of hypertension but BP has been soft this admission.  - Initially required Milrinone but this was able to be stopped on 10/19. - Home Lisinopril has been stopped. Will continue to hold this.   Hyperlipidemia - LDL 69 on 09/07/2021. - Continue Lipitor 40mg  daily.  AKI - Creatinine 1.35 today, up from 0.79 yesterday. - Management per primary team.  Leukocytosis - WBC trending back up. Peaked at 42.3 yesterday and 33.0 today. - Felt to be secondary to aspiration vs reactive. - Was treated with antibiotics earlier this admission. - Management per primary team.   Otherwise, per primary team.    For questions or updates, please contact [place] Please consult [email or web address] for contact info under   Signed, Darreld Mclean, PA-C  10/17/2021, 11:02 AM    Patient seen, examined. Available data reviewed. Agree with findings, assessment, and plan as outlined by Sande Rives, PA-C.  The patient is independently examined and evaluated.  He is an alert, oriented, elderly male in no distress.  Lungs are clear, JVP is normal, heart is regular rate and rhythm with a 3/6 harsh systolic murmur but distant heart sounds, abdomen soft and nontender, extremities have no edema.  I agree with the plan as outlined above.  The patient actually looks really good this evening and he just worked with physical therapy with no exertional angina.  He was not breathless with activity.  He did have concerning chest discomfort overnight as outlined above.  Our ability to treat him with antianginal therapy is very limited.  It would be reasonable  to treat him with just a touch  of a beta-blocker.  His heart rate is in the 90s and I think would potentially respond well to a very low-dose of metoprolol.  I will try him on 12.5 mg twice daily.  Otherwise I think it is pain should be treated with narcotics as needed.  No other specific cardiac recommendations.  The patient is in pretty good spirits.  Sherren Mocha, M.D. 10/17/2021 6:33 PM

## 2021-10-17 NOTE — Progress Notes (Signed)
OT Cancellation Note  Patient Details Name: Nicholas Finley MRN: [id number] DOB: November 20, 1926   Cancelled Treatment:    Reason Eval/Treat Not Completed: Other (comment) Pt currently with breakfast. Will follow up again for OT session as schedule permits with pt agreeable.   [name] 10/17/2021, 8:42 AM

## 2021-10-17 NOTE — TOC Progression Note (Addendum)
Transition of Care [facility]) - Progression Note    Patient Details  Name: Nicholas Finley MRN: [id number] Date of Birth: 05/30/1926  Transition [facility] CM/SW Contact  [name], RN Phone Number: 10/17/2021, 4:59 PM  Clinical Narrative:    NCM spoke with patient he  lives alone, does not want to go to a SNF, he gets meds from [name] at [facility].  He states his niece [name] [phone] checks on him. He states he drives himself to the MD apts, and he has a friend [name] who is a friend who run errands for him.  NCM offered choice from Medicare.gov list, he chose [facility] for HHRN,HHPT, HHOT, HHAIDE and Education officer, museum.  NCM made referral to [facility] , he is able to take referral.  Soc will begin 24 to 48 hrs post dc. NCM asked patient if he would like outpatient palliative services, he states yes and he is ok with [facility], NCM made referral to [facility] with [facility] for outpatient palliative services. Also [name] will talk to patient about private duty services for a sitter or aide to let patient know the cost if he is interested. NCM left message for [name] his niece also.   Expected Discharge Plan: [place] Barriers to Discharge: Continued Medical Work up  Expected Discharge Plan and Services Expected Discharge Plan: [place]   Discharge Planning Services: CM Consult Post Acute Care Choice: [place] arrangements for the past 2 months: Single Family Home                 DME Arranged:  (Patient already has a cane.) DME Agency: NA       HH Arranged: Therapist, sports, PT, OT, Nurse's Aide, Social Work [employer] Agency: [place] Date [place]: 10/17/21 Time [place]: 1658 Representative spoke with at [place]: [place] ([place]) Interventions    Readmission Risk Interventions No flowsheet data found.

## 2021-10-17 NOTE — Telephone Encounter (Signed)
**Note De-identified  Obfuscation** -----  **Note De-Identified  Obfuscation** Message from Darreld Mclean, [place] sent at 10/14/2021  4:43 PM EDT ----- Regarding: TOC Call Patient has TOC visit scheduled for 10/26/2021 at 8:50am with Richardson Dopp. I suspect he will be discharged over the weekend. He just needs a TOC call.  Thank you! Callie

## 2021-10-17 NOTE — Progress Notes (Signed)
Pharmacy Antibiotic Note  Nicholas Finley is a 87 y.o. male admitted on 10/09/2021 with  aspiration PNA .  Pharmacy has been consulted for Unasyn dosing.  Plan: Unasyn 3g IV q 12 hrs.  Weight: 61.8 kg (136 lb 3.9 oz)  Temp (24hrs), Avg:98.3 F (36.8 C), Min:97.3 F (36.3 C), Max:99.4 F (37.4 C)  Recent Labs  Lab 10/13/21 0409 10/14/21 0332 10/14/21 0650 10/15/21 0243 10/16/21 0251 10/17/21 0422 10/17/21 0942  WBC 14.1* 12.2*  --  12.9* 17.6* 42.3* 33.0*  CREATININE 0.94  --  0.81 0.83 0.79 1.35*  --     Estimated Creatinine Clearance: 29.9 mL/min (A) (by C-G formula based on SCr of 1.35 mg/dL (H)).    No Known Allergies  Antimicrobials this admission: 10/17 Cefepime >> 10/19 10/19 augmentin >> 10/21  Dose adjustments this admission:  Microbiology results: 10/16 Bcx: ng 10/17 Resp pannel neg  Thank you for allowing pharmacy to be a part of this patient's care.  [place] Crane, Roylene Reason, BCCP Clinical Pharmacist  10/17/2021 5:40 PM   [facility] pharmacy phone numbers are listed on [email or web address]

## 2021-10-17 NOTE — Progress Notes (Signed)
  Mobility Specialist Criteria Algorithm Info.  Mobility Team: HOB elevated: Activity: Ambulated in room; Ambulated to bathroom; Dangled on edge of bed Range of motion: Active; All extremities Level of assistance: Standby assist, set-up cues, supervision of patient - no hands on Assistive device: Front wheel walker Minutes sitting in chair:  Minutes stood:  Minutes ambulated: 3 minutes Distance ambulated (ft): 25 ft Mobility response: Tolerated well Bed Position: Semi-fowlers  Patient receive dangling EOB, declined hallway ambulation but requested assistance to restroom. Stood from EOB with supervision + cues hand placement and ambulated in room at min guard. Returned to EOB without incident. Was left with all needs met  10/17/2021 12:13 PM

## 2021-10-17 NOTE — Plan of Care (Signed)

## 2021-10-18 ENCOUNTER — Inpatient Hospital Stay (HOSPITAL_COMMUNITY): Payer: Medicare Other

## 2021-10-18 DIAGNOSIS — I5023 Acute on chronic systolic (congestive) heart failure: Secondary | ICD-10-CM

## 2021-10-18 DIAGNOSIS — Z515 Encounter for palliative care: Secondary | ICD-10-CM | POA: Diagnosis not present

## 2021-10-18 DIAGNOSIS — I214 Non-ST elevation (NSTEMI) myocardial infarction: Secondary | ICD-10-CM | POA: Diagnosis not present

## 2021-10-18 DIAGNOSIS — Z7189 Other specified counseling: Secondary | ICD-10-CM | POA: Diagnosis not present

## 2021-10-18 LAB — CBC
HCT: 36.6 % — ABNORMAL LOW (ref 39.0–52.0)
Hemoglobin: 12.7 g/dL — ABNORMAL LOW (ref 13.0–17.0)
MCH: 31.4 pg (ref 26.0–34.0)
MCHC: 34.7 g/dL (ref 30.0–36.0)
MCV: 90.6 fL (ref 80.0–100.0)
Platelets: 339 10*3/uL (ref 150–400)
RBC: 4.04 MIL/uL — ABNORMAL LOW (ref 4.22–5.81)
RDW: 13.7 % (ref 11.5–15.5)
WBC: 30.7 10*3/uL — ABNORMAL HIGH (ref 4.0–10.5)
nRBC: 0 % (ref 0.0–0.2)

## 2021-10-18 LAB — HEPATIC FUNCTION PANEL
ALT: 198 U/L — ABNORMAL HIGH (ref 0–44)
AST: 224 U/L — ABNORMAL HIGH (ref 15–41)
Albumin: 2.8 g/dL — ABNORMAL LOW (ref 3.5–5.0)
Alkaline Phosphatase: 51 U/L (ref 38–126)
Bilirubin, Direct: 0.2 mg/dL (ref 0.0–0.2)
Indirect Bilirubin: 1.5 mg/dL — ABNORMAL HIGH (ref 0.3–0.9)
Total Bilirubin: 1.7 mg/dL — ABNORMAL HIGH (ref 0.3–1.2)
Total Protein: 6.9 g/dL (ref 6.5–8.1)

## 2021-10-18 LAB — BASIC METABOLIC PANEL
Anion gap: 10 (ref 5–15)
BUN: 54 mg/dL — ABNORMAL HIGH (ref 8–23)
CO2: 23 mmol/L (ref 22–32)
Calcium: 9.1 mg/dL (ref 8.9–10.3)
Chloride: 97 mmol/L — ABNORMAL LOW (ref 98–111)
Creatinine, Ser: 1.35 mg/dL — ABNORMAL HIGH (ref 0.61–1.24)
GFR, Estimated: 49 mL/min — ABNORMAL LOW (ref >60)
Glucose, Bld: 168 mg/dL — ABNORMAL HIGH (ref 70–99)
Potassium: 3.9 mmol/L (ref 3.5–5.1)
Sodium: 130 mmol/L — ABNORMAL LOW (ref 135–145)

## 2021-10-18 LAB — LACTIC ACID, PLASMA: Lactic Acid, Venous: 5.4 mmol/L (ref 0.5–1.9)

## 2021-10-18 MED ORDER — SIMETHICONE 80 MG PO CHEW
160.0000 mg | CHEWABLE_TABLET | Freq: Four times a day (QID) | ORAL | Status: DC | PRN
  Administered 2021-10-18: 160 mg via ORAL
  Filled 2021-10-18: qty 2

## 2021-10-18 MED ORDER — ALUM & MAG HYDROXIDE-SIMETH 200-200-20 MG/5ML PO SUSP
15.0000 mL | Freq: Four times a day (QID) | ORAL | Status: DC | PRN
  Administered 2021-10-18: 15 mL via ORAL
  Filled 2021-10-18: qty 30

## 2021-10-18 MED ORDER — SENNA 8.6 MG PO TABS: 1.0000 | ORAL_TABLET | Freq: Two times a day (BID) | ORAL | Status: DC

## 2021-10-18 MED ORDER — SIMETHICONE 80 MG PO CHEW: 80.0000 mg | CHEWABLE_TABLET | Freq: Four times a day (QID) | ORAL | Status: DC | PRN

## 2021-10-18 MED ORDER — ONDANSETRON HCL 4 MG/2ML IJ SOLN
4.0000 mg | Freq: Four times a day (QID) | INTRAMUSCULAR | Status: DC | PRN
  Administered 2021-10-18 – 2021-10-21 (×4): 4 mg via INTRAVENOUS
  Filled 2021-10-18 (×4): qty 2

## 2021-10-18 MED ORDER — ONDANSETRON HCL 4 MG/2ML IJ SOLN
4.0000 mg | Freq: Once | INTRAMUSCULAR | Status: AC | PRN
  Administered 2021-10-18: 4 mg via INTRAVENOUS
  Filled 2021-10-18: qty 2

## 2021-10-18 MED ORDER — FLEET ENEMA 7-19 GM/118ML RE ENEM
1.0000 | ENEMA | Freq: Once | RECTAL | Status: AC
  Administered 2021-10-18: 1 via RECTAL
  Filled 2021-10-18: qty 1

## 2021-10-18 MED ORDER — SENNA 8.6 MG PO TABS
2.0000 | ORAL_TABLET | Freq: Every day | ORAL | Status: DC
  Administered 2021-10-20 – 2021-10-21 (×2): 17.2 mg via ORAL
  Filled 2021-10-18 (×4): qty 2

## 2021-10-18 MED ORDER — ENOXAPARIN SODIUM 30 MG/0.3ML IJ SOSY
30.0000 mg | PREFILLED_SYRINGE | INTRAMUSCULAR | Status: DC
  Administered 2021-10-20: 30 mg via SUBCUTANEOUS
  Filled 2021-10-18: qty 0.3

## 2021-10-18 NOTE — Progress Notes (Signed)
[facility] ([facility])  Hospital Liaison: RN note         This patient has been referred to our palliative care services in the community.  [facility] will continue to follow for any discharge planning needs and to coordinate continuation of palliative care in the outpatient setting.    If you have questions or need assistance, please call [phone] or contact the hospital Liaison listed on AMION.      Thank you for this referral.         Mary Anne Robertson, RN, CCM  [facility] Hospital Liaison   [phone] 

## 2021-10-18 NOTE — Progress Notes (Addendum)
Progress Note  Patient Name: Nicholas Finley Date of Encounter: 10/18/2021  [facility] Cardiologist: Larae Grooms, MD   Subjective   Patient appears more out of it today. He states he is not feeling well. He reports some diffuse abdominal pain and some nausea. He has not had a bowel movement in several days. Abdominal x-ray today showed non-obstructive bowel gas pattern and moderate amount of stool within the rectosigmoid colon. However, he states he chest pain is basically completely gone. No significant shortness of breath.  Inpatient Medications    Scheduled Meds:  aspirin EC  81 mg Oral Daily   atorvastatin  40 mg Oral Daily   clopidogrel  75 mg Oral Daily   enoxaparin (LOVENOX) injection  40 mg Subcutaneous Q24H   furosemide  40 mg Oral Daily   metoprolol tartrate  12.5 mg Oral BID   senna  1 tablet Oral Daily   sodium chloride flush  3 mL Intravenous Q12H   sodium phosphate  1 enema Rectal Once   Continuous Infusions:  ampicillin-sulbactam (UNASYN) IV 3 g (10/18/21 7425)   PRN Meds: alum & mag hydroxide-simeth, docusate sodium, HYDROcodone-acetaminophen, morphine injection, nitroGLYCERIN, ondansetron (ZOFRAN) IV   Vital Signs    Vitals:   10/17/21 2211 10/18/21 0038 10/18/21 0416 10/18/21 0416  BP: 91/67 120/80 109/73   Pulse: (!) 105 (!) 117 (!) 109 (!) 112  Resp:   18   Temp:  98.4 F (36.9 C) 97.6 F (36.4 C)   TempSrc:  Oral Oral   SpO2:   (!) 87% 92%  Weight:  62.4 kg      Intake/Output Summary (Last 24 hours) at 10/18/2021 1138 Last data filed at 10/17/2021 1847 Gross per 24 hour  Intake 210 ml  Output --  Net 210 ml   Last 3 Weights 10/18/2021 10/17/2021 10/16/2021  Weight (lbs) 137 lb 9.6 oz 136 lb 3.9 oz 134 lb 6.4 oz  Weight (kg) 62.415 kg 61.8 kg 60.963 kg      Telemetry    Sinus rhythm with rate in the 90s to 110s. - Personally Reviewed  ECG    Sinus tachycardia, rate 103 bpm, with ST depression in V3-V6 and T wave  inversion in leads I and aVL. - Personally Reviewed  Physical Exam   GEN: No acute distress.   Neck: No JVD. Cardiac: Tachycardic with regular rhythm. Heart sounds difficult to appreciated today.  Respiratory: No increased work of breathing. Diminished breath sounds in bases. No wheezes, rhonchi, or rales. GI: Soft, non-distended, and non-tender. Bowel sounds present. MS: No lower extremity edema. No deformity. Skin: Lower extremities slightly cool to the touch but dry. Neuro:  No focal deficits. Psych: Normal affect. Responds appropriately.  Labs    High Sensitivity Troponin:   Recent Labs  Lab 10/09/21 1710 10/09/21 1921 10/10/21 0500 10/11/21 0601  TROPONINIHS 155* 1,071* 20,610* 7,652*     Chemistry Recent Labs  Lab 10/13/21 0409 10/14/21 0650 10/15/21 0243 10/16/21 0251 10/17/21 0422 10/18/21 0355  NA 137 136   < > 136 132* 130*  K 4.0 3.9   < > 3.6 4.0 3.9  CL 102 101   < > 102 100 97*  CO2 24 25   < > 22 20* 23  GLUCOSE 106* 105*   < > 110* 194* 168*  BUN 34* 31*   < > 24* 41* 54*  CREATININE 0.94 0.81   < > 0.79 1.35* 1.35*  CALCIUM 8.9 8.9   < >  8.9 8.7* 9.1  MG 2.0  --   --   --   --   --   PROT  --  6.7  --   --   --   --   ALBUMIN  --  3.1*  --   --   --   --   AST  --  34  --   --   --   --   ALT  --  35  --   --   --   --   ALKPHOS  --  53  --   --   --   --   BILITOT  --  2.2*  --   --   --   --   GFRNONAA >60 >60   < > >60 49* 49*  ANIONGAP 11 10   < > 12 12 10    < > = values in this interval not displayed.    Lipids No results for input(s): CHOL, TRIG, HDL, LABVLDL, LDLCALC, CHOLHDL in the last 168 hours.  Hematology Recent Labs  Lab 10/17/21 0422 10/17/21 0942 10/18/21 0355  WBC 42.3* 33.0* 30.7*  RBC 4.29 4.12* 4.04*  HGB 13.4 12.8* 12.7*  HCT 39.9 38.6* 36.6*  MCV 93.0 93.7 90.6  MCH 31.2 31.1 31.4  MCHC 33.6 33.2 34.7  RDW 14.0 13.9 13.7  PLT 346 334 339   Thyroid No results for input(s): TSH, FREET4 in the last 168 hours.   BNPNo results for input(s): BNP, PROBNP in the last 168 hours.  DDimer No results for input(s): DDIMER in the last 168 hours.   Radiology    DG Abd 1 View  Result Date: 10/18/2021 CLINICAL DATA:  Abdominal pain, nausea, constipation EXAM: ABDOMEN - 1 VIEW COMPARISON:  07/11/2013 FINDINGS: The bowel gas pattern is normal. Moderate amount of stool within the rectosigmoid colon. Aortic atherosclerosis. No radio-opaque calculi or other significant radiographic abnormality are seen. IMPRESSION: Nonobstructive bowel gas pattern. Moderate amount of stool within the rectosigmoid colon. Electronically Signed   By: Davina Poke D.O.   On: 10/18/2021 10:51   DG CHEST PORT 1 VIEW  Result Date: 10/17/2021 CLINICAL DATA:  Leukocytosis EXAM: PORTABLE CHEST 1 VIEW COMPARISON:  10/12/2021, 10/11/2021, 10/10/2021, 10/09/2021 FINDINGS: Cardiomegaly with vascular congestion and pulmonary edema. Slight worsening of pleural effusions and basilar airspace disease. Aortic atherosclerosis. No pneumothorax. IMPRESSION: Cardiomegaly with vascular congestion and pulmonary edema. Interval worsening of pleural effusions and basilar airspace disease since prior exam. Electronically Signed   By: Donavan Foil M.D.   On: 10/17/2021 16:54    Cardiac Studies   Echocardiogram 10/10/2021: Impressions:  1. Left ventricular ejection fraction, by estimation, is 40 to 45%. The  left ventricle has mildly decreased function. The left ventricle  demonstrates regional wall motion abnormalities (see scoring  diagram/findings for description). Left ventricular  diastolic parameters are consistent with Grade I diastolic dysfunction  (impaired relaxation).   2. Right ventricular systolic function is normal. The right ventricular  size is normal.   3. The mitral valve is normal in structure. No evidence of mitral valve  regurgitation. No evidence of mitral stenosis.   4. The aortic valve is calcified. There is moderate  calcification of the  aortic valve. There is moderate thickening of the aortic valve. Aortic  valve regurgitation is moderate. Moderate aortic valve stenosis. Aortic  valve mean gradient measures 25.2  mmHg. Aortic valve Vmax measures 3.16 m/s.   5. The inferior vena cava is dilated in  size with <50% respiratory  variability, suggesting right atrial pressure of 15 mmHg. _______________   Lower Extremity Dopplers 10/10/2021: Summary: - No evidence of deep vein thrombosis seen in the lower extremities,  bilaterally.  -No evidence of popliteal cyst, bilaterally.  _______________   Right/Left Cardiac Catheterization 10/11/2021:   Colon Flattery LM lesion is 95% stenosed.   Prox LAD to Mid LAD lesion is 70% stenosed.   Ost Cx to Prox Cx lesion is 80% stenosed.   LV end diastolic pressure is mildly elevated.   Hemodynamic findings consistent with mild pulmonary hypertension.   There is severe aortic valve stenosis.   Critical ostial left  main stenosis - heavily calcified Moderate proximal LAD and LCx stenosis also heavily calcified Severe aortic stenosis. Mean gradient 28 mm Hg with AVA 0.72 cm squared, index 0.41 Elevated LV filling pressures - mean PCWP 24 mm Hg Mild pulmonary HTN. Mean PAP 33 mm Hg Low cardiac output 3.49 L/min with index 1.97.   Plan: patient has very high risk anatomy with critical ostial Left main stenosis and severe AS. Will continue IV diuresis. Add inotropic support with IV milrinone. Will need heart team approach to review possible treatment options.    Diagnostic Dominance: Right      Patient Profile     87 y.o. male with a history of TIA, hypertension, and hyperlipidemia who was admitted with NSTEMI and acute hypoxic respiratory failure on 10/09/2021 after presenting with chest pain and severe shortness of breath. Work-up showed critical ostial left main stenosis and severe aortic stenosis. He is felt ot be high risk for any percutaneous intervention and not a  candidate for surgical management. Therefore, plan is for conservative management. Palliative Care following.  Assessment & Plan    NSTEMI Severe Aortic Stenosis  - High-sensitivity troponin peaked at 20,610.  - Echo showed LVEF of 40-45% with akinesis of the apical lateral segment, mid inferoseptal segment, apical septal segment, and apex as well as grade 1 diastolic dysfunction and moderate AS. - R/LHC on 10/18 showed critical left main heavily calcified stenosis, 70% stenosis of the proximal to mid LAD, and 80% stenosis of the ostial to proximal LCX, severe aortic stenosis, and low cardiac output of 3.49 L/min.  - Not a candidate for bypass surgery/AVR and would be at high risk for periprocedural complication and death if percutaneous intervention was attempted. Therefore, medical therapy and palliative care recommended. Palliative care is following. - No significant chest pain since yesterday morning. - Continue DAPT with Aspirin and Plavix as well as high-intensity statin. - Started on Lopressor 12.5mg  twice daily yesterday but unable to add additional antianginals given soft BP. He is likely going to continue to have intermittent chest pain. Unfortunately, options are very limited. Can continue to use PRN narcotics for chest pain. Currently has PRN Vicodin and PRN Morphine ordered.    Acute Systolic CHF Ischemic Cardiomyopathy Low Output Cardiomyopathy - BNP 625 >> 1,802 >> 945. - Echo as above.  - R/LHC showed elevated LV filling pressures with mean PCWP of 24 mmHg and low cardiac output of 3.49 L/min with index of 1.97. - Started on Milrinone which was able to be stopped on 10/19. Initially diuresed with IV Lasix but has now been switched to PO Lasix. Net negative 4.79 L this admission. Weight 136 lbs today, down from 149 lbs on admission. Renal function stable. - Appear euvolemic on exam. He does reports some abdominal pain and nausea today. He does not appear as well today. I  am  somewhat concerned that this may be due to low output CHF again. Will repeat lactic acid as well as LFTs. - Continue current dose of Lasix 40mg  daily. - Started on Lopressor 12.5mg  twice daily. Will continue.  - Unable to add additional GDMT due to soft BP. Want to avoid hypotension in setting of severe AS. - Continue to monitor volume status closely.    Hypertension - History of hypertension but BP has been soft this admission.  - Initially required Milrinone but this was able to be stopped on 10/19. - Home Lisinopril has been stopped. Will continue to hold this.   Hyperlipidemia - LDL 69 on 09/07/2021. - Continue Lipitor 40mg  daily.   AKI - Creatinine stable at 1.35 today. Baseline around 0.7 to 0.9.  - Management per primary team.   Leukocytosis - WBC trending back up. Peaked at 42.3 on 10/23 and slowly trending down. 30.7 today.  - Felt to be secondary to aspiration vs reactive. - Was treated with antibiotics earlier this admission and Unasyn was started yesterday. - Management per primary team.   Otherwise, per primary team.  For questions or updates, please contact [place] Please consult [email or web address] for contact info under        Signed, Darreld Mclean, PA-C  10/18/2021, 11:38 AM    Patient seen, examined. Available data reviewed. Agree with findings, assessment, and plan as outlined by Sande Rives, PA-C.  The patient is feeling better this afternoon after just having a bowel movement.  He is alert, oriented, elderly male in no distress.  Lungs are clear, heart is mildly tachycardic and regular with distant heart sounds and a 2/6 harsh systolic murmur at the right upper sternal border, abdomen is soft and nontender, extremities have no edema.  The patient appeared acutely ill this morning.  I am concerned about his labs with lactic acidosis and lactate of 5.4, elevated transaminases, all suggestive of low output state.  The patient also has leukocytosis that is  beginning to trend downward but is still markedly elevated at 30,000.  I completely agree with a palliative approach to his care in the setting of advanced heart disease with very severe nonrevascularizable coronary disease and severe aortic stenosis.  The patient is being followed by the palliative care team.  I started him on a very low-dose of metoprolol yesterday to try to reduce ischemia, but with evidence of low output, I am going to discontinue metoprolol.  With his low blood pressure, I really do not have any other suggestions for his medical therapy.  He is not a candidate for inotropic therapy as his care is moving in a more palliative direction.  Sherren Mocha, M.D. 10/18/2021 2:58 PM

## 2021-10-18 NOTE — Progress Notes (Signed)
Patient reporting stomach pain and nausea. Patient has bowel sounds and is passing gas. Notified MD.

## 2021-10-18 NOTE — Progress Notes (Signed)
PROGRESS NOTE    Nicholas Finley  HKV:[id number] DOB: Dec 02, 1926 DOA: 10/09/2021 PCP: [name], MD   Brief Narrative: 87 year old with past medical history significant for hypertension, HLD, who presented with worsening chest pain and shortness of breath over the last 2 days prior to admission.  He also reported worsening dyspnea over several weeks.  On EMS arrival patient was found hypoxic oxygen saturation in the 60 and EKG with concern for ST depression in the inferior and lateral leads.  Patient was admitted with non-STEMI, he was also placed on BiPAP with some improvement of his respiratory distress, he was a started on nitroglycerin drip, subsequently nitroglycerin is stopped due to low blood pressure.  Patient was also started on a heparin drip.  Patient underwent heart cath which showed critical ostial left main stenosis which was heavily calcified, moderate proximal LAD and circumflex stenosis.  Severe aortic stenosis.  Patient does not wish to proceed with high risk procedure.  He was made DNR.  Palliative care has been consulted for further goals of care and help with disposition.   Assessment & Plan:   Active Problems:   Chest pain   Sepsis with acute hypoxic respiratory failure and septic shock ([place])   Community acquired pneumonia   Respiratory failure ([employer])   Pulmonary edema   Nonrheumatic aortic valve stenosis   1-Acute hypoxic respiratory failure secondary to diffuse pulmonary edema and or pneumonia: Patient improved with BiPAP.  Currently off  BiPAP. He received IV lasix.  -On oral  lasix.  -leukocytosis, back on IV antibiotics.  -lactic acid elevated, will defer fluid to cardiology due to HF.   2-NSTEMI, Acute systolic heart failure exacerbation, Aortic stenosis  -Cath: which showed critical ostial left main stenosis which was heavily calcified, moderate proximal LAD and circumflex stenosis.  Severe aortic stenosis. -Patient does not  wishes to proceed to  high risk procedure. -Medical management.  He received IV heparin drip, Plavix, statins. -Develop chest pain last night, and had episode this am. Received nitro and dilaudid.  -started on low dose metoprolol.   -Lactic acidosis, Concern for low out put state. Avoid IV fluids to avoid worsening pulmonary edema and pleural effusion. Continue wit IV antibiotics.   Leukocytosis: Related to aspiration versus leukemoid reaction from NSTEMI, HF>  WBC increase to 30, Chest x ray ; pulmonary edema, worsening pleural effusion and airspace diseases.  -started on IV Unasyn 10/24--- WBC decreased to 30.   Abdominal pain; suspect related to constipation, vs Hypoperfusion.   KUB: Moderate amount of stool rectosigmoid.  Fleet enema ordered.  PRN Zofran.   Hypokalemia: Replaced.  Transaminases: Likely hepatic congestion from heart failure.  Hyponatremia; monitor on lasix.    Estimated body mass index is 22.55 kg/m as calculated from the following:   Height as of 09/07/21: 5' 5.5" (1.664 m).   Weight as of this encounter: 62.4 kg.   DVT prophylaxis: Lovenox Code Status: DNR Family Community: [name] updated/ 10/25 Disposition Plan:  Status is: Inpatient  Remains inpatient appropriate because: awaiting safe discharge plan.  Patient with chest pain, worsening leukocytosis, nausea. Not stable  for discharge, I am concern patient might decline further, palliative care to follow up on patient today.     Consultants:  Cardiology   Procedures:  Cath  Antimicrobials:  Unasyn.   Subjective: He is complaining of abdominal pain, no BM, passing gas.  Report nausea.  Denies chest pain, report palpitation.   Objective: Vitals:   10/18/21 0038 10/18/21 0416 10/18/21  0416 10/18/21 1206  BP: 120/80 109/73  95/69  Pulse: (!) 117 (!) 109 (!) 112 95  Resp:  18    Temp: 98.4 F (36.9 C) 97.6 F (36.4 C)  98 F (36.7 C)  TempSrc: Oral Oral    SpO2:  (!) 87% 92% 100%  Weight: 62.4 kg        Intake/Output Summary (Last 24 hours) at 10/18/2021 1359 Last data filed at 10/17/2021 1847 Gross per 24 hour  Intake 120 ml  Output --  Net 120 ml    Filed Weights   10/16/21 0333 10/17/21 0335 10/18/21 0038  Weight: 61 kg 61.8 kg 62.4 kg    Examination:  General exam: Lying in bed. No distress on oxygen.  Respiratory system: BL crackles.  Cardiovascular system: S 1, S 2 RRR Gastrointestinal system: BS present, soft, nt Central nervous system: Alert Extremities: no edema    Data Reviewed: I have personally reviewed following labs and imaging studies  CBC: Recent Labs  Lab 10/15/21 0243 10/16/21 0251 10/17/21 0422 10/17/21 0942 10/18/21 0355  WBC 12.9* 17.6* 42.3* 33.0* 30.7*  NEUTROABS  --   --   --  29.0*  --   HGB 12.4* 13.1 13.4 12.8* 12.7*  HCT 37.2* 39.6 39.9 38.6* 36.6*  MCV 92.8 93.8 93.0 93.7 90.6  PLT 274 306 346 334 782    Basic Metabolic Panel: Recent Labs  Lab 10/13/21 0409 10/14/21 0650 10/15/21 0243 10/16/21 0251 10/17/21 0422 10/18/21 0355  NA 137 136 135 136 132* 130*  K 4.0 3.9 3.8 3.6 4.0 3.9  CL 102 101 103 102 100 97*  CO2 24 25 23 22  20* 23  GLUCOSE 106* 105* 109* 110* 194* 168*  BUN 34* 31* 30* 24* 41* 54*  CREATININE 0.94 0.81 0.83 0.79 1.35* 1.35*  CALCIUM 8.9 8.9 8.7* 8.9 8.7* 9.1  MG 2.0  --   --   --   --   --     GFR: Estimated Creatinine Clearance: 30.2 mL/min (A) (by C-G formula based on SCr of 1.35 mg/dL (H)). Liver Function Tests: Recent Labs  Lab 10/14/21 0650  AST 34  ALT 35  ALKPHOS 53  BILITOT 2.2*  PROT 6.7  ALBUMIN 3.1*    No results for input(s): LIPASE, AMYLASE in the last 168 hours. No results for input(s): AMMONIA in the last 168 hours. Coagulation Profile: No results for input(s): INR, PROTIME in the last 168 hours. Cardiac Enzymes: No results for input(s): CKTOTAL, CKMB, CKMBINDEX, TROPONINI in the last 168 hours. BNP (last 3 results) No results for input(s): PROBNP in the last 8760  hours. HbA1C: No results for input(s): HGBA1C in the last 72 hours.  CBG: Recent Labs  Lab 10/13/21 0321 10/13/21 0646 10/13/21 0834 10/13/21 1147 10/13/21 1543  GLUCAP 108* 100* 127* 138* 99    Lipid Profile: No results for input(s): CHOL, HDL, LDLCALC, TRIG, CHOLHDL, LDLDIRECT in the last 72 hours.  Thyroid Function Tests: No results for input(s): TSH, T4TOTAL, FREET4, T3FREE, THYROIDAB in the last 72 hours. Anemia Panel: No results for input(s): VITAMINB12, FOLATE, FERRITIN, TIBC, IRON, RETICCTPCT in the last 72 hours. Sepsis Labs: Recent Labs  Lab 10/12/21 0426 10/18/21 1313  PROCALCITON 1.67  --   LATICACIDVEN  --  5.4*     Recent Results (from the past 240 hour(s))  Blood culture (routine x 2)     Status: None   Collection Time: 10/09/21  5:10 PM   Specimen: BLOOD  Result Value  Ref Range Status   Specimen Description BLOOD SITE NOT SPECIFIED  Final   Special Requests   Final    BOTTLES DRAWN AEROBIC ONLY Blood Culture results may not be optimal due to an inadequate volume of blood received in culture bottles   Culture   Final    NO GROWTH 5 DAYS Performed at [facility], [place] [address], [place], [place] [address]    Report Status 10/14/2021 FINAL  Final  Resp Panel by RT-PCR (Flu A&B, Covid) Nasopharyngeal Swab     Status: None   Collection Time: 10/09/21  7:09 PM   Specimen: Nasopharyngeal Swab; Nasopharyngeal(NP) swabs in vial transport medium  Result Value Ref Range Status   SARS Coronavirus 2 by RT PCR NEGATIVE NEGATIVE Final    Comment: (NOTE) SARS-CoV-2 target nucleic acids are NOT DETECTED.  The SARS-CoV-2 RNA is generally detectable in upper respiratory specimens during the acute phase of infection. The lowest concentration of SARS-CoV-2 viral copies this assay can detect is 138 copies/mL. A negative result does not preclude SARS-Cov-2 infection and should not be used as the sole basis for treatment or other patient management decisions.  A negative result may occur with  improper specimen collection/handling, submission of specimen other than nasopharyngeal swab, presence of viral mutation(s) within the areas targeted by this assay, and inadequate number of viral copies(<138 copies/mL). A negative result must be combined with clinical observations, patient history, and epidemiological information. The expected result is Negative.  Fact Sheet for Patients:  EntrepreneurPulse.com.au  Fact Sheet for Healthcare Providers:  IncredibleEmployment.be  This test is no t yet approved or cleared by the [place] FDA and  has been authorized for detection and/or diagnosis of SARS-CoV-2 by FDA under an Emergency Use Authorization (EUA). This EUA will remain  in effect (meaning this test can be used) for the duration of the COVID-19 declaration under Section 564(b)(1) of the Act, 21 U.S.C.section 360bbb-3(b)(1), unless the authorization is terminated  or revoked sooner.       Influenza A by PCR NEGATIVE NEGATIVE Final   Influenza B by PCR NEGATIVE NEGATIVE Final    Comment: (NOTE) The Xpert Xpress SARS-CoV-2/FLU/RSV plus assay is intended as an aid in the diagnosis of influenza from Nasopharyngeal swab specimens and should not be used as a sole basis for treatment. Nasal washings and aspirates are unacceptable for Xpert Xpress SARS-CoV-2/FLU/RSV testing.  Fact Sheet for Patients: EntrepreneurPulse.com.au  Fact Sheet for Healthcare Providers: IncredibleEmployment.be  This test is not yet approved or cleared by the [place] FDA and has been authorized for detection and/or diagnosis of SARS-CoV-2 by FDA under an Emergency Use Authorization (EUA). This EUA will remain in effect (meaning this test can be used) for the duration of the COVID-19 declaration under Section 564(b)(1) of the Act, 21 U.S.C. section 360bbb-3(b)(1), unless the authorization  is terminated or revoked.  Performed at [facility], [place] [address], [place], [place] [address]   Blood culture (routine x 2)     Status: None   Collection Time: 10/09/21  9:25 PM   Specimen: BLOOD  Result Value Ref Range Status   Specimen Description BLOOD RIGHT ANTECUBITAL  Final   Special Requests   Final    BOTTLES DRAWN AEROBIC AND ANAEROBIC Blood Culture adequate volume   Culture   Final    NO GROWTH 5 DAYS Performed at [facility], [place] [address], [place], [place] [address]    Report Status 10/14/2021 FINAL  Final  MRSA Next Gen by PCR, Nasal     Status: None   Collection Time: 10/10/21  5:27 AM   Specimen: Nasal Mucosa; Nasal Swab  Result Value Ref Range Status   MRSA by PCR Next Gen NOT DETECTED NOT DETECTED Final    Comment: (NOTE) The GeneXpert MRSA Assay (FDA approved for NASAL specimens only), is one component of a comprehensive MRSA colonization surveillance program. It is not intended to diagnose MRSA infection nor to guide or monitor treatment for MRSA infections. Test performance is not FDA approved in patients less than 25 years old. Performed at [facility], [place] [address] [place], [place] [address]   Respiratory (~20 pathogens) panel by PCR     Status: None   Collection Time: 10/10/21  5:27 AM   Specimen: Nasopharyngeal Swab; Respiratory  Result Value Ref Range Status   Adenovirus NOT DETECTED NOT DETECTED Final   Coronavirus 229E NOT DETECTED NOT DETECTED Final    Comment: (NOTE) The Coronavirus on the Respiratory Panel, DOES NOT test for the novel  Coronavirus (2019 nCoV)    Coronavirus HKU1 NOT DETECTED NOT DETECTED Final   Coronavirus NL63 NOT DETECTED NOT DETECTED Final   Coronavirus OC43 NOT DETECTED NOT DETECTED Final   Metapneumovirus NOT DETECTED NOT DETECTED Final   Rhinovirus / Enterovirus NOT DETECTED NOT DETECTED Final   Influenza A NOT DETECTED NOT DETECTED Final   Influenza B NOT DETECTED NOT DETECTED Final    Parainfluenza Virus 1 NOT DETECTED NOT DETECTED Final   Parainfluenza Virus 2 NOT DETECTED NOT DETECTED Final   Parainfluenza Virus 3 NOT DETECTED NOT DETECTED Final   Parainfluenza Virus 4 NOT DETECTED NOT DETECTED Final   Respiratory Syncytial Virus NOT DETECTED NOT DETECTED Final   Bordetella pertussis NOT DETECTED NOT DETECTED Final   Bordetella Parapertussis NOT DETECTED NOT DETECTED Final   Chlamydophila pneumoniae NOT DETECTED NOT DETECTED Final   Mycoplasma pneumoniae NOT DETECTED NOT DETECTED Final    Comment: Performed at [facility], [place]. [address] [place], [place] [address]          Radiology Studies: DG Abd 1 View  Result Date: 10/18/2021 CLINICAL DATA:  Abdominal pain, nausea, constipation EXAM: ABDOMEN - 1 VIEW COMPARISON:  07/11/2013 FINDINGS: The bowel gas pattern is normal. Moderate amount of stool within the rectosigmoid colon. Aortic atherosclerosis. No radio-opaque calculi or other significant radiographic abnormality are seen. IMPRESSION: Nonobstructive bowel gas pattern. Moderate amount of stool within the rectosigmoid colon. Electronically Signed   By: Davina Poke D.O.   On: 10/18/2021 10:51   DG CHEST PORT 1 VIEW  Result Date: 10/17/2021 CLINICAL DATA:  Leukocytosis EXAM: PORTABLE CHEST 1 VIEW COMPARISON:  10/12/2021, 10/11/2021, 10/10/2021, 10/09/2021 FINDINGS: Cardiomegaly with vascular congestion and pulmonary edema. Slight worsening of pleural effusions and basilar airspace disease. Aortic atherosclerosis. No pneumothorax. IMPRESSION: Cardiomegaly with vascular congestion and pulmonary edema. Interval worsening of pleural effusions and basilar airspace disease since prior exam. Electronically Signed   By: Donavan Foil M.D.   On: 10/17/2021 16:54        Scheduled Meds:  aspirin EC  81 mg Oral Daily   atorvastatin  40 mg Oral Daily   clopidogrel  75 mg Oral Daily   [START ON 10/19/2021] enoxaparin (LOVENOX) injection  30 mg Subcutaneous  Q24H   furosemide  40 mg Oral Daily   metoprolol tartrate  12.5 mg Oral BID   senna  1 tablet Oral Daily   sodium chloride flush  3 mL Intravenous Q12H   Continuous Infusions:  ampicillin-sulbactam (UNASYN) IV 3 g (10/18/21 0613)     LOS: 9 days    Time spent: 35 minutes    Browning Southwood A Towanna Avery, MD [facility]   If 7PM-7AM, please contact night-coverage [email or web address]  10/18/2021, 1:59 PM

## 2021-10-18 NOTE — Progress Notes (Signed)
Patient's lactic acid 5.4. notified MD.

## 2021-10-18 NOTE — Progress Notes (Signed)
Occupational Therapy Treatment Patient Details Name: Markee Matera MRN: [id number] DOB: 1926-04-26 Today's Date: 10/18/2021   History of present illness Pt is a 87 y.o. male admitted 10/09/21 with worsening chest pain and SOB. Workup for NSTEMI, acute respiratory failure. PMH includes HTN, heart murmur, sepsis, cataract.   OT comments  Patient received in bed and stated he felt better than he did this morning but was tired from a busy day.  Patient used cane for mobility in room and bathroom and stood at sink with supervision.  Patient changed socks while seated on eob and stated he needed to rest more.  Acute OT to continue to follow.    Recommendations for follow up therapy are one component of a multi-disciplinary discharge planning process, led by the attending physician.  Recommendations may be updated based on patient status, additional functional criteria and insurance authorization.    Follow Up Recommendations  Home health OT    Assistance Recommended at Discharge Intermittent Supervision/Assistance  Equipment Recommendations  None recommended by OT    Recommendations for Other Services      Precautions / Restrictions Precautions Precautions: Fall Precaution Comments: watch HR       Mobility Bed Mobility Overal bed mobility: Independent             General bed mobility comments: no assist needed, good stability    Transfers Overall transfer level: Needs assistance Equipment used: Straight cane Transfers: Sit to/from Stand Sit to Stand: Supervision           General transfer comment: performed mobility in room and bathroom with cane and supervision     Balance Overall balance assessment: Needs assistance Sitting-balance support: Feet supported Sitting balance-Leahy Scale: Good     Standing balance support: No upper extremity supported Standing balance-Leahy Scale: Fair Standing balance comment: stood at sink for grooming                            ADL either performed or assessed with clinical judgement   ADL Overall ADL's : Needs assistance/impaired     Grooming: Wash/dry hands;Wash/dry face;Oral care;Standing;Supervision/safety Grooming Details (indicate cue type and reason): able to stand at sink for grooming             Lower Body Dressing: Supervision/safety Lower Body Dressing Details (indicate cue type and reason): doffed and donned socks seated on eob             Functional mobility during ADLs: Supervision/safety;Cane General ADL Comments: stood at toilet to urinate     Vision       Perception     Praxis      Cognition Arousal/Alertness: Awake/alert Behavior During Therapy: WFL for tasks assessed/performed Overall Cognitive Status: Impaired/Different from baseline Area of Impairment: Safety/judgement;Problem solving                         Safety/Judgement: Decreased awareness of safety;Decreased awareness of deficits   Problem Solving: Requires verbal cues General Comments: patient quickly fatigued today          Exercises     Shoulder Instructions       General Comments      Pertinent Vitals/ Pain       Pain Assessment: No/denies pain  Home Living  Prior Functioning/Environment              Frequency  Min 2X/week        Progress Toward Goals  OT Goals(current goals can now be found in the care plan section)  Progress towards OT goals: Progressing toward goals  Acute Rehab OT Goals OT Goal Formulation: With patient Time For Goal Achievement: 11/20/2021 Potential to Achieve Goals: Good ADL Goals Pt Will Perform Grooming: with modified independence;standing Pt Will Perform Lower Body Bathing: with modified independence;sit to/from stand Pt Will Perform Lower Body Dressing: with modified independence;sit to/from stand Pt Will Transfer to Toilet: with modified independence;regular  height toilet;ambulating Pt Will Perform Toileting - Clothing Manipulation and hygiene: with modified independence;sit to/from stand Pt/caregiver will Perform Home Exercise Program: Increased strength;Both right and left upper extremity;With theraband;With written HEP provided  Plan Discharge plan remains appropriate    Co-evaluation                 AM-PAC OT "6 Clicks" Daily Activity     Outcome Measure   Help from another person eating meals?: None Help from another person taking care of personal grooming?: A Little Help from another person toileting, which includes using toliet, bedpan, or urinal?: A Little Help from another person bathing (including washing, rinsing, drying)?: A Little Help from another person to put on and taking off regular upper body clothing?: None Help from another person to put on and taking off regular lower body clothing?: A Little 6 Click Score: 20    End of Session Equipment Utilized During Treatment:  (cane)  OT Visit Diagnosis: Unsteadiness on feet (R26.81)   Activity Tolerance Patient tolerated treatment well   Patient Left in bed;with call bell/phone within reach;with bed alarm set   Nurse Communication Mobility status        Time: 1513-1530 OT Time Calculation (min): 17 min  Charges: OT General Charges $OT Visit: 1 Visit OT Treatments $Self Care/Home Management : 8-22 mins  Lodema Hong, [place]  Pager [phone] Office [place] 10/18/2021, 3:38 PM

## 2021-10-18 NOTE — Progress Notes (Signed)
Palliative:  HPI: 87 y.o. male  with past medical history of HTN, HLD admitted on 10/09/2021 with chest pain and shortness of breath and found to have NSTEMI, severe aortic stenosis, EF 40%. He has opted for conservative medical management.   I met today with Nicholas Finley along with RN Vida Roller. Vida Roller shares that he has had significant abd discomfort and KUB shows moderate stool burden and gas. He has already had enema and I increased senokot for tomorrow. Also added simethicone as needed as he seems to be having gas pains. He is more sleepy and weak today and has not been able to have intake due to his abd discomfort.   I spent time discussing with Nicholas Finley concern for his blood work and infection and potentially worsening heart function. He was surprised but also accepting. He confirms his desire to pursue antibiotics and also confirms that he does not want escalation to inotropes or aggressive IV infusions or ICU level care. He agrees to continuing hospital stay until we know how this is going to go. He confirms desire for comfort care if he worsens. He has been clear that he is ready to go when it is time and has had a good life.   I called and also discussed with cousin, Nicholas Finley. Susan's brother, Nicholas Finley, if documented HCPOA and Nicholas Finley has been keeping him updated as she is the one local. Nicholas Finley was initially surprised at changes as they were previously discussing getting him home today or tomorrow. Nicholas Finley is supportive of Mr. Mitch decisions and these remain consistent with Living Will and previous conversations. We did discuss that if he continues to decline we could potentially consider hospice facility but it is too soon to know what is going to be best for Nicholas Finley.   All questions/concerns addressed. Emotional support provided. Discussed with Dr. Tyrell Antonio and RN Vida Roller.   Exam: Alert but sleepy. Oriented. Resting in bed comfortably. Abd soft. Moves all extremities.   Plan: - Okay for  antibiotics, labs, conservative measures.  - NO plans for initiation of inotropes, continuous infusions (other than trial IVF), or ICU level care.  - Comfort care if he declines.  - Watchful waiting for him to declare himself over next couple days.   [place], NP Palliative Medicine Team Pager [phone] (Please see [email or web address] for schedule) Team Phone [phone]    Greater than 50%  of this time was spent counseling and coordinating care related to the above assessment and plan

## 2021-10-18 NOTE — Progress Notes (Signed)
Pt complaining nauseated, abdominal pain/discomfort-he describes as Gassy feeling and wanting to have BM. Patient has been passing gas. Maalox was given. Patient was not relieved. DR Opyd was paged. EKG done-QTC 474. Zofran was ordered.

## 2021-10-19 DIAGNOSIS — I5082 Biventricular heart failure: Secondary | ICD-10-CM

## 2021-10-19 DIAGNOSIS — R6520 Severe sepsis without septic shock: Secondary | ICD-10-CM

## 2021-10-19 DIAGNOSIS — R748 Abnormal levels of other serum enzymes: Secondary | ICD-10-CM

## 2021-10-19 DIAGNOSIS — E872 Acidosis, unspecified: Secondary | ICD-10-CM | POA: Diagnosis not present

## 2021-10-19 DIAGNOSIS — J189 Pneumonia, unspecified organism: Secondary | ICD-10-CM | POA: Diagnosis not present

## 2021-10-19 DIAGNOSIS — Z7189 Other specified counseling: Secondary | ICD-10-CM | POA: Diagnosis not present

## 2021-10-19 DIAGNOSIS — I214 Non-ST elevation (NSTEMI) myocardial infarction: Secondary | ICD-10-CM | POA: Diagnosis not present

## 2021-10-19 DIAGNOSIS — A419 Sepsis, unspecified organism: Secondary | ICD-10-CM | POA: Diagnosis not present

## 2021-10-19 DIAGNOSIS — R6521 Severe sepsis with septic shock: Secondary | ICD-10-CM | POA: Diagnosis not present

## 2021-10-19 DIAGNOSIS — Z515 Encounter for palliative care: Secondary | ICD-10-CM | POA: Diagnosis not present

## 2021-10-19 DIAGNOSIS — R7989 Other specified abnormal findings of blood chemistry: Secondary | ICD-10-CM

## 2021-10-19 DIAGNOSIS — J9601 Acute respiratory failure with hypoxia: Secondary | ICD-10-CM | POA: Diagnosis not present

## 2021-10-19 LAB — CBC
HCT: 37.5 % — ABNORMAL LOW (ref 39.0–52.0)
Hemoglobin: 12.8 g/dL — ABNORMAL LOW (ref 13.0–17.0)
MCH: 31.2 pg (ref 26.0–34.0)
MCHC: 34.1 g/dL (ref 30.0–36.0)
MCV: 91.5 fL (ref 80.0–100.0)
Platelets: 371 10*3/uL (ref 150–400)
RBC: 4.1 MIL/uL — ABNORMAL LOW (ref 4.22–5.81)
RDW: 13.7 % (ref 11.5–15.5)
WBC: 26 10*3/uL — ABNORMAL HIGH (ref 4.0–10.5)
nRBC: 0 % (ref 0.0–0.2)

## 2021-10-19 LAB — BASIC METABOLIC PANEL
Anion gap: 14 (ref 5–15)
BUN: 80 mg/dL — ABNORMAL HIGH (ref 8–23)
CO2: 21 mmol/L — ABNORMAL LOW (ref 22–32)
Calcium: 9 mg/dL (ref 8.9–10.3)
Chloride: 97 mmol/L — ABNORMAL LOW (ref 98–111)
Creatinine, Ser: 2.31 mg/dL — ABNORMAL HIGH (ref 0.61–1.24)
GFR, Estimated: 26 mL/min — ABNORMAL LOW (ref >60)
Glucose, Bld: 142 mg/dL — ABNORMAL HIGH (ref 70–99)
Potassium: 4.7 mmol/L (ref 3.5–5.1)
Sodium: 132 mmol/L — ABNORMAL LOW (ref 135–145)

## 2021-10-19 MED ORDER — FLEET ENEMA 7-19 GM/118ML RE ENEM
1.0000 | ENEMA | Freq: Every day | RECTAL | Status: DC | PRN
  Administered 2021-10-22: 1 via RECTAL
  Filled 2021-10-19 (×2): qty 1

## 2021-10-19 MED ORDER — SODIUM CHLORIDE 0.9 % IV SOLN: INTRAVENOUS | Status: DC | PRN

## 2021-10-19 NOTE — Progress Notes (Signed)
  Mobility Specialist Criteria Algorithm Info.   Mobility Team: HOB elevated: Activity: Ambulated in room; Dangled on edge of bed Range of motion: Active; All extremities Level of assistance: Minimal assist, patient does 75% or more Assistive device: Front wheel walker Minutes sitting in chair:  Minutes stood:  Minutes ambulated: 3 minutes Distance ambulated (ft): 25 ft Mobility response: Tolerated well Bed Position: Semi-fowlers  Patient received dangling EOB, initially declined mobility but eventually agreed to ambulate in room. Ambulated in room with minimal HHA to steady. Tolerated room ambulation well without complaint was left with all needs met.   10/19/2021 4:31 PM

## 2021-10-19 NOTE — Progress Notes (Signed)
Patient complaining of nausea throughout the day. Stated that IV zofran administered did help, but that he has no appetite. Very little PO intake today. Edwena Blow, RN

## 2021-10-19 NOTE — Progress Notes (Signed)
Patient complaining of "not feeling well." Declined all morning medicines. RN notified MD. Edwena Blow, RN

## 2021-10-19 NOTE — Progress Notes (Signed)
Physical Therapy Treatment Patient Details Name: Nicholas Finley MRN: [id number] DOB: 1926/04/18 Today's Date: 10/19/2021   History of Present Illness Pt is a 87 y.o. male admitted 10/09/21 with worsening chest pain and SOB. Workup for NSTEMI, acute respiratory failure. PMH includes HTN, heart murmur, sepsis, cataract.    PT Comments    RN in room on entry, trying to convince pt to have enema. Pt refusing, PT and RN then endorsed ambulation to possibly assist in bowel movement. Pt agreeable to try. Pt requiring min A for bed mobility and transfer with SPC. Pt request stop at bathroom where he urinated and attempted BM, and unsuccessful.  Pt is contact guard assist for ambulation in hallway with SPC. Pt request 3x standing restbreak due to SoB. D/c plans remain appropriate however if mobility remains at min A level may need to consider SNF level rehab. PT will continue to follow acutely.    Recommendations for follow up therapy are one component of a multi-disciplinary discharge planning process, led by the attending physician.  Recommendations may be updated based on patient status, additional functional criteria and insurance authorization.  Follow Up Recommendations  Home health PT     Assistance Recommended at Discharge Intermittent Supervision/Assistance  Equipment Recommendations  None recommended by PT    Recommendations for Other Services       Precautions / Restrictions Precautions Precautions: Fall Restrictions Weight Bearing Restrictions: No     Mobility  Bed Mobility Overal bed mobility: Needs Assistance Bed Mobility: Supine to Sit     Supine to sit: Min assist Sit to supine: Min guard   General bed mobility comments: min A for pulling up against therapist to come to EoB, min guard for return to bed, increased effort and time required for returning LE back to bed.    Transfers Overall transfer level: Needs assistance Equipment used: Straight  cane Transfers: Sit to/from Stand Sit to Stand: Min assist;Min guard           General transfer comment: min A for power up from bed to prevent pt from pushing up on unsteady tray table. min guard to stand from low toilet with use of grab bars    Ambulation/Gait Ambulation/Gait assistance: Min assist Gait Distance (Feet): 50 Feet Assistive device: Straight cane Gait Pattern/deviations: Step-through pattern;Decreased stride length;Wide base of support Gait velocity: slowed Gait velocity interpretation: <1.31 ft/sec, indicative of household ambulator General Gait Details: contact guard assit for steadying with slow, mildly unsteady gait, no overt LoB, pt requiring 3x standing rest break due to 4/4 DoE          Balance Overall balance assessment: Needs assistance Sitting-balance support: Feet supported Sitting balance-Leahy Scale: Good     Standing balance support: Single extremity supported Standing balance-Leahy Scale: Fair Standing balance comment: Can static stand and walk without UE support; static and dynamic stability improved with cane                            Cognition Arousal/Alertness: Awake/alert Behavior During Therapy: WFL for tasks assessed/performed Overall Cognitive Status: Impaired/Different from baseline Area of Impairment: Safety/judgement;Problem solving                         Safety/Judgement: Decreased awareness of safety;Decreased awareness of deficits   Problem Solving: Requires verbal cues General Comments: WFL for simple tasks, decreased insight to need for assistance or increased risk of falls at home following  hospitalization. pt talking about how he walked "less than 2 miles" to [employer] for meals and plans to do this again at d/c        Exercises      General Comments General comments (skin integrity, edema, etc.): HR 110 bpm with ambulation. ambulates on RA with SaO2 in 90s, however request 2L via  on  return to supine for comfort      Pertinent Vitals/Pain Pain Assessment: Faces Faces Pain Scale: Hurts little more Pain Location: stomach Pain Descriptors / Indicators: Grimacing;Guarding Pain Intervention(s): Repositioned;Monitored during session;Limited activity within patient's tolerance           PT Goals (current goals can now be found in the care plan section) Acute Rehab PT Goals Patient Stated Goal: I'm going home PT Goal Formulation: With patient Time For Goal Achievement: 11/02/2021 Potential to Achieve Goals: Fair Progress towards PT goals: Progressing toward goals (limited by pain and fatigue)    Frequency    Min 3X/week      PT Plan Current plan remains appropriate       AM-PAC PT "6 Clicks" Mobility   Outcome Measure  Help needed turning from your back to your side while in a flat bed without using bedrails?: None Help needed moving from lying on your back to sitting on the side of a flat bed without using bedrails?: None Help needed moving to and from a bed to a chair (including a wheelchair)?: A Little Help needed standing up from a chair using your arms (e.g., wheelchair or bedside chair)?: A Little Help needed to walk in hospital room?: A Little Help needed climbing 3-5 steps with a railing? : A Little 6 Click Score: 20    End of Session Equipment Utilized During Treatment: Gait belt Activity Tolerance: Patient limited by pain;Patient limited by fatigue (l) Patient left: in bed;with call bell/phone within reach;with bed alarm set Nurse Communication: Mobility status PT Visit Diagnosis: Other abnormalities of gait and mobility (R26.89);Muscle weakness (generalized) (M62.81)     Time: 6222-9798 PT Time Calculation (min) (ACUTE ONLY): 22 min  Charges:  $Therapeutic Exercise: 8-22 mins                     Jatavion Peaster B. Migdalia Dk PT, DPT Acute Rehabilitation Services Pager [phone] Office [phone]    [place] 10/19/2021, 12:02 PM

## 2021-10-19 NOTE — Progress Notes (Signed)
RN offered patient an enema per order. Patient declined. Will continue to monitor. Edwena Blow, RN

## 2021-10-19 NOTE — Progress Notes (Signed)
Progress Note  Patient Name: Nicholas Finley Date of Encounter: 10/19/2021  [facility] Cardiologist: Larae Grooms, MD   Subjective   No chest pain or shortness of breath.  Inpatient Medications    Scheduled Meds:  aspirin EC  81 mg Oral Daily   atorvastatin  40 mg Oral Daily   clopidogrel  75 mg Oral Daily   enoxaparin (LOVENOX) injection  30 mg Subcutaneous Q24H   senna  2 tablet Oral Daily   sodium chloride flush  3 mL Intravenous Q12H   Continuous Infusions:  sodium chloride 10 mL/hr at 10/19/21 0659   ampicillin-sulbactam (UNASYN) IV 3 g (10/19/21 0659)   PRN Meds: sodium chloride, alum & mag hydroxide-simeth, docusate sodium, HYDROcodone-acetaminophen, morphine injection, nitroGLYCERIN, ondansetron (ZOFRAN) IV, simethicone, sodium phosphate   Vital Signs    Vitals:   10/18/21 2019 10/18/21 2028 10/19/21 0530 10/19/21 1053  BP: 100/71  (!) 107/57 92/63  Pulse: (!) 102 99 99 97  Resp: (!) 22 20 18 16   Temp: (!) 97.5 F (36.4 C)  98.1 F (36.7 C) (!) 97.4 F (36.3 C)  TempSrc: Oral  Oral Oral  SpO2: (!) 88% 92% 93% 91%  Weight:   61.7 kg     Intake/Output Summary (Last 24 hours) at 10/19/2021 1259 Last data filed at 10/19/2021 1050 Gross per 24 hour  Intake 677 ml  Output 150 ml  Net 527 ml   Last 3 Weights 10/19/2021 10/18/2021 10/17/2021  Weight (lbs) 136 lb 1.6 oz 137 lb 9.6 oz 136 lb 3.9 oz  Weight (kg) 61.735 kg 62.415 kg 61.8 kg       Physical Exam  Elderly male, resting comfortably, no distress GEN: No acute distress.   Neck: No JVD Cardiac: RRR, distant heart sounds with 2/6 systolic murmur at the right upper sternal border Respiratory: Clear to auscultation bilaterally. GI: Soft, nontender, non-distended  MS: No edema; No deformity. Neuro:  Nonfocal  Psych: Normal affect   Labs    High Sensitivity Troponin:   Recent Labs  Lab 10/09/21 1710 10/09/21 1921 10/10/21 0500 10/11/21 0601  TROPONINIHS 155* 1,071* 20,610*  7,652*     Chemistry Recent Labs  Lab 10/13/21 0409 10/14/21 0650 10/15/21 0243 10/17/21 0422 10/18/21 0355 10/18/21 1313 10/19/21 0403  NA 137 136   < > 132* 130*  --  132*  K 4.0 3.9   < > 4.0 3.9  --  4.7  CL 102 101   < > 100 97*  --  97*  CO2 24 25   < > 20* 23  --  21*  GLUCOSE 106* 105*   < > 194* 168*  --  142*  BUN 34* 31*   < > 41* 54*  --  80*  CREATININE 0.94 0.81   < > 1.35* 1.35*  --  2.31*  CALCIUM 8.9 8.9   < > 8.7* 9.1  --  9.0  MG 2.0  --   --   --   --   --   --   PROT  --  6.7  --   --   --  6.9  --   ALBUMIN  --  3.1*  --   --   --  2.8*  --   AST  --  34  --   --   --  224*  --   ALT  --  35  --   --   --  198*  --   ALKPHOS  --  53  --   --   --  51  --   BILITOT  --  2.2*  --   --   --  1.7*  --   GFRNONAA >60 >60   < > 49* 49*  --  26*  ANIONGAP 11 10   < > 12 10  --  14   < > = values in this interval not displayed.    Lipids No results for input(s): CHOL, TRIG, HDL, LABVLDL, LDLCALC, CHOLHDL in the last 168 hours.  Hematology Recent Labs  Lab 10/17/21 0942 10/18/21 0355 10/19/21 0403  WBC 33.0* 30.7* 26.0*  RBC 4.12* 4.04* 4.10*  HGB 12.8* 12.7* 12.8*  HCT 38.6* 36.6* 37.5*  MCV 93.7 90.6 91.5  MCH 31.1 31.4 31.2  MCHC 33.2 34.7 34.1  RDW 13.9 13.7 13.7  PLT 334 339 371   Thyroid No results for input(s): TSH, FREET4 in the last 168 hours.  BNPNo results for input(s): BNP, PROBNP in the last 168 hours.  DDimer No results for input(s): DDIMER in the last 168 hours.   Radiology    DG Abd 1 View  Result Date: 10/18/2021 CLINICAL DATA:  Abdominal pain, nausea, constipation EXAM: ABDOMEN - 1 VIEW COMPARISON:  07/11/2013 FINDINGS: The bowel gas pattern is normal. Moderate amount of stool within the rectosigmoid colon. Aortic atherosclerosis. No radio-opaque calculi or other significant radiographic abnormality are seen. IMPRESSION: Nonobstructive bowel gas pattern. Moderate amount of stool within the rectosigmoid colon. Electronically  Signed   By: Davina Poke D.O.   On: 10/18/2021 10:51   DG CHEST PORT 1 VIEW  Result Date: 10/17/2021 CLINICAL DATA:  Leukocytosis EXAM: PORTABLE CHEST 1 VIEW COMPARISON:  10/12/2021, 10/11/2021, 10/10/2021, 10/09/2021 FINDINGS: Cardiomegaly with vascular congestion and pulmonary edema. Slight worsening of pleural effusions and basilar airspace disease. Aortic atherosclerosis. No pneumothorax. IMPRESSION: Cardiomegaly with vascular congestion and pulmonary edema. Interval worsening of pleural effusions and basilar airspace disease since prior exam. Electronically Signed   By: Donavan Foil M.D.   On: 10/17/2021 16:54     Patient Profile     87 y.o. male with a history of TIA, hypertension, and hyperlipidemia who was admitted with NSTEMI and acute hypoxic respiratory failure on 10/09/2021 after presenting with chest pain and severe shortness of breath. Work-up showed critical ostial left main stenosis and severe aortic stenosis. He is felt to be high risk for any percutaneous intervention and not a candidate for surgical management. Therefore, plan is for conservative management. Palliative Care following.  Assessment & Plan    1.  Non-STEMI with critical left main stenosis: Patient at prohibitive risk of intervention.  Palliative care approach taken. 2.  Acute low output systolic heart failure: Lactate elevated yesterday, AKI noted today with creatinine increased from 1.35-2.31.  Suspect this is ATN from hypoperfusion.  Not a candidate for inotropic support or ICU care. 3.  Severe aortic stenosis: Palliative care following. 4.  Leukocytosis, improving  Not much else to add from a cardiac perspective.  The patient is declining and a palliative approach to his care is pursued.  I have reviewed all of their notes.  The patient still wishes to have basic treatments of antibiotics if indicated and IV fluids as needed.  He does not wish for inotropic support, ICU care, or escalation of therapies.  I  think all of this is very appropriate.  I had a long talk with his friend Nicholas Finley and updated her on his condition. Please contact me if  I can be of any assistance in his care.  [facility] will sign off.   Medication Recommendations:  no new recommendations Other recommendations (labs, testing, etc):  none Follow up as an outpatient:  no cardiology follow-up indicated  For questions or updates, please contact [place] Please consult [email or web address] for contact info under        Signed, Sherren Mocha, MD  10/19/2021, 12:59 PM

## 2021-10-19 NOTE — Progress Notes (Signed)
PROGRESS NOTE  Nicholas Finley DGU:[id number] DOB: 02-14-1926   PCP: [name], MD  Patient is from: Home.  DOA: 10/09/2021 LOS: 10  Chief complaints:  Chief Complaint  Patient presents with   Respiratory Distress     Brief Narrative / Interim history: 87 year old M with PMH of HTN and HLD presenting with chest pain, shortness of breath and hypoxemia to 60% on room air, and admitted for acute respiratory failure with hypoxia in the setting of non-STEMI, acute systolic CHF with severe aortic stenosis, possible aspiration pneumonia and acute kidney injury.  Patient underwent left heart catheterization that showed critical left OM stenosis, moderate proximal LAD and circumflex stenosis and severe aortic stenosis.  Palliative medicine consulted.  CODE STATUS changed to DNR/DNI.  Plan was to discharge home with palliative care follow-up.  However, patient continues to decline with progressive AKI, acute hepatitis and significant lactic acidosis to 5.7.   Subjective: Seen and examined earlier this morning.  No major events overnight of this morning.  He reports constipation.  He refused morning meds earlier saying he does not feel well.  He denies chest pain or shortness of breath at rest.  Objective: Vitals:   10/18/21 2019 10/18/21 2028 10/19/21 0530 10/19/21 1053  BP: 100/71  (!) 107/57 92/63  Pulse: (!) 102 99 99 97  Resp: (!) 22 20 18 16   Temp: (!) 97.5 F (36.4 C)  98.1 F (36.7 C) (!) 97.4 F (36.3 C)  TempSrc: Oral  Oral Oral  SpO2: (!) 88% 92% 93% 91%  Weight:   61.7 kg     Intake/Output Summary (Last 24 hours) at 10/19/2021 1530 Last data filed at 10/19/2021 1050 Gross per 24 hour  Intake 677 ml  Output 150 ml  Net 527 ml   Filed Weights   10/17/21 0335 10/18/21 0038 10/19/21 0530  Weight: 61.8 kg 62.4 kg 61.7 kg    Examination:  GENERAL: No apparent distress.  Nontoxic. HEENT: MMM.  Vision and hearing grossly intact.  NECK: Supple.  No apparent JVD.   RESP: 93% on 2 L at rest.  No IWOB.  Fair aeration bilaterally. CVS:  RRR. Heart sounds normal.  ABD/GI/GU: BS+. Abd soft, NTND.  MSK/EXT:  Moves extremities. No apparent deformity. No edema.  SKIN: no apparent skin lesion or wound NEURO: Awake and alert.  Oriented x4.  No apparent focal neuro deficit. PSYCH: Calm. Normal affect.   Procedures:  R/LHC on 10/18 Critical ostial left  main stenosis - heavily calcified Moderate proximal LAD and LCx stenosis also heavily calcified Severe aortic stenosis. Mean gradient 28 mm Hg with AVA 0.72 cm squared, index 0.41 Elevated LV filling pressures - mean PCWP 24 mm Hg Mild pulmonary HTN. Mean PAP 33 mm Hg Low cardiac output 3.49 L/min with index 1.97.  Microbiology summarized: COVID-19 and influenza PCR nonreactive. Full RVP panel negative. Blood cultures negative.  Assessment & Plan: Acute respiratory failure with hypoxia-due to pulmonary edema, pneumonia and non-STEMI.  Reportedly desaturated to 60% when EMS arrived.  Currently saturating in low 90s on 2 L at rest. -Continue supplemental oxygen -Treat treatable causes  Acute on chronic systolic CHF/severe aortic stenosis: TTE with LVEF of 40 to 45%, R WMA, G1 DD and moderate aortic valve stenosis. R/LHC as above.  Appears euvolemic on exam.  Now with AKI and elevated liver enzymes.  Not on diuretics due to AKI and hypotension -Monitor fluid status, renal functions and electrolytes  Non-STEMI/CAD: R/LHC as above.  Off IV heparin.  Not interested in high risk procedures. -Continue antiplatelets and statins -Cardiology signed off.  Severe sepsis in the setting of possible aspiration pneumonia: Patient with leukocytosis, significant lactic acidosis, AKI and respiratory failure.  CXR with bibasilar air space disease raising concern for possible pneumonia in addition to pulmonary edema from CHF. Patient is not interested in inotropic support, ICU care or escalation of therapies.  -Continue IV  Unasyn 10/24>>> -He is underlying CHF limits use of IV fluid.  AKI/azotemia: Progressive.  Concern for cardiorenal syndrome.  Could have some iatrogenic insult from diuretics, vancomycin and contrast Recent Labs    10/12/21 0426 10/12/21 1111 10/12/21 1641 10/13/21 0409 10/14/21 0650 10/15/21 0243 10/16/21 0251 10/17/21 0422 10/18/21 0355 10/19/21 0403  BUN 34* 33* 35* 34* 31* 30* 24* 41* 54* 80*  CREATININE 0.82 0.87 0.88 0.94 0.81 0.83 0.79 1.35* 1.35* 2.31*  -Continue monitoring -Cannot give IV fluid due to CHF -Avoid nephrotoxic meds  Elevated liver enzymes/hyperbilirubinemia-could be congestive hepatopathy Recent Labs  Lab 10/14/21 0650 10/18/21 1313  AST 34 224*  ALT 35 198*  ALKPHOS 53 51  BILITOT 2.2* 1.7*  PROT 6.7 6.9  ALBUMIN 3.1* 2.8*  -Recheck in the morning  Lactic acidosis-could be from sepsis, hypoperfusion and AKI.  Continue antibiotics for sepsis.  Limited option for low cardiac output/hypoperfusion  Leukocytosis: Improved.  Could be due to sepsis or leukemoid reaction. -Recheck in the morning  Hyponatremia: Stable -Recheck in the morning  Abdominal pain-likely due to constipation, uremia or hypoperfusion.  -Continue bowel regimen  Goal of care: Patient with multiorgan failure with concern for septic shock.  Appropriately DNR/DNI.  Very grim prognosis.  Not a candidate for dialysis.  Cardiology has nothing much to add.  Patient is not interested in inotropic support, ICU care or escalation of therapies.  Continue current scope of treatment.  Palliative medicine following.   Body mass index is 22.3 kg/m.         DVT prophylaxis:  enoxaparin (LOVENOX) injection 30 mg Start: 10/19/21 1000 SCDs Start: 10/09/21 2011  Code Status: DNR/DNI Family Communication: Patient and/or RN. Available if any question.  Level of care: Telemetry Cardiac Status is: Inpatient  Remains inpatient appropriate because: Critically ill patient with very grim  prognosis.        Consultants:  Cardiology Palliative medicine   Sch Meds:  Scheduled Meds:  aspirin EC  81 mg Oral Daily   atorvastatin  40 mg Oral Daily   clopidogrel  75 mg Oral Daily   enoxaparin (LOVENOX) injection  30 mg Subcutaneous Q24H   senna  2 tablet Oral Daily   sodium chloride flush  3 mL Intravenous Q12H   Continuous Infusions:  sodium chloride 10 mL/hr at 10/19/21 0659   ampicillin-sulbactam (UNASYN) IV 3 g (10/19/21 0659)   PRN Meds:.sodium chloride, alum & mag hydroxide-simeth, docusate sodium, HYDROcodone-acetaminophen, morphine injection, nitroGLYCERIN, ondansetron (ZOFRAN) IV, simethicone, sodium phosphate  Antimicrobials: Anti-infectives (From admission, onward)    Start     Dose/Rate Route Frequency Ordered Stop   10/17/21 1830  Ampicillin-Sulbactam (UNASYN) 3 g in sodium chloride 0.9 % 100 mL IVPB        3 g 200 mL/hr over 30 Minutes Intravenous Every 12 hours 10/17/21 1739     10/12/21 1100  amoxicillin-clavulanate (AUGMENTIN) 875-125 MG per tablet 1 tablet        1 tablet Oral Every 12 hours 10/12/21 1001 10/14/21 2012   10/11/21 0800  vancomycin (VANCOCIN) IVPB 1000 mg/200 mL premix  Status:  Discontinued        1,000 mg 200 mL/hr over 60 Minutes Intravenous Every 36 hours 10/09/21 1919 10/10/21 1157   10/10/21 2200  ceFEPIme (MAXIPIME) 2 g in sodium chloride 0.9 % 100 mL IVPB  Status:  Discontinued        2 g 200 mL/hr over 30 Minutes Intravenous Every 12 hours 10/10/21 1157 10/12/21 1001   10/10/21 2000  vancomycin (VANCOCIN) IVPB 1000 mg/200 mL premix  Status:  Discontinued        1,000 mg 200 mL/hr over 60 Minutes Intravenous Every 24 hours 10/10/21 1157 10/10/21 1345   10/09/21 2300  ceFEPIme (MAXIPIME) 2 g in sodium chloride 0.9 % 100 mL IVPB  Status:  Discontinued        2 g 200 mL/hr over 30 Minutes Intravenous Every 24 hours 10/09/21 2150 10/10/21 1157   10/09/21 1815  vancomycin (VANCOREADY) IVPB 1250 mg/250 mL        1,250  mg 166.7 mL/hr over 90 Minutes Intravenous  Once 10/09/21 1812 10/09/21 2105   10/09/21 1745  cefTRIAXone (ROCEPHIN) 1 g in sodium chloride 0.9 % 100 mL IVPB        1 g 200 mL/hr over 30 Minutes Intravenous  Once 10/09/21 1741 10/09/21 1920   10/09/21 1745  azithromycin (ZITHROMAX) 500 mg in sodium chloride 0.9 % 250 mL IVPB        500 mg 250 mL/hr over 60 Minutes Intravenous  Once 10/09/21 1741 10/09/21 2105        I have personally reviewed the following labs and images: CBC: Recent Labs  Lab 10/16/21 0251 10/17/21 0422 10/17/21 0942 10/18/21 0355 10/19/21 0403  WBC 17.6* 42.3* 33.0* 30.7* 26.0*  NEUTROABS  --   --  29.0*  --   --   HGB 13.1 13.4 12.8* 12.7* 12.8*  HCT 39.6 39.9 38.6* 36.6* 37.5*  MCV 93.8 93.0 93.7 90.6 91.5  PLT 306 346 334 339 371   BMP &GFR Recent Labs  Lab 10/13/21 0409 10/14/21 0650 10/15/21 0243 10/16/21 0251 10/17/21 0422 10/18/21 0355 10/19/21 0403  NA 137   < > 135 136 132* 130* 132*  K 4.0   < > 3.8 3.6 4.0 3.9 4.7  CL 102   < > 103 102 100 97* 97*  CO2 24   < > 23 22 20* 23 21*  GLUCOSE 106*   < > 109* 110* 194* 168* 142*  BUN 34*   < > 30* 24* 41* 54* 80*  CREATININE 0.94   < > 0.83 0.79 1.35* 1.35* 2.31*  CALCIUM 8.9   < > 8.7* 8.9 8.7* 9.1 9.0  MG 2.0  --   --   --   --   --   --    < > = values in this interval not displayed.   Estimated Creatinine Clearance: 17.4 mL/min (A) (by C-G formula based on SCr of 2.31 mg/dL (H)). Liver & Pancreas: Recent Labs  Lab 10/14/21 0650 10/18/21 1313  AST 34 224*  ALT 35 198*  ALKPHOS 53 51  BILITOT 2.2* 1.7*  PROT 6.7 6.9  ALBUMIN 3.1* 2.8*   No results for input(s): LIPASE, AMYLASE in the last 168 hours. No results for input(s): AMMONIA in the last 168 hours. Diabetic: No results for input(s): HGBA1C in the last 72 hours. Recent Labs  Lab 10/13/21 0321 10/13/21 0646 10/13/21 0834 10/13/21 1147 10/13/21 1543  GLUCAP 108* 100* 127* 138* 99   Cardiac Enzymes: No  results  for input(s): CKTOTAL, CKMB, CKMBINDEX, TROPONINI in the last 168 hours. No results for input(s): PROBNP in the last 8760 hours. Coagulation Profile: No results for input(s): INR, PROTIME in the last 168 hours. Thyroid Function Tests: No results for input(s): TSH, T4TOTAL, FREET4, T3FREE, THYROIDAB in the last 72 hours. Lipid Profile: No results for input(s): CHOL, HDL, LDLCALC, TRIG, CHOLHDL, LDLDIRECT in the last 72 hours. Anemia Panel: No results for input(s): VITAMINB12, FOLATE, FERRITIN, TIBC, IRON, RETICCTPCT in the last 72 hours. Urine analysis:    Component Value Date/Time   COLORURINE STRAW (A) 06/21/2018 1546   APPEARANCEUR CLEAR 06/21/2018 1546   LABSPEC 1.006 06/21/2018 1546   PHURINE 8.0 06/21/2018 1546   GLUCOSEU NEGATIVE 06/21/2018 1546   HGBUR LARGE (A) 06/21/2018 1546   BILIRUBINUR NEGATIVE 06/21/2018 1546   BILIRUBINUR neg 05/13/2014 1906   KETONESUR NEGATIVE 06/21/2018 1546   PROTEINUR NEGATIVE 06/21/2018 1546   UROBILINOGEN 1.0 05/13/2014 1906   UROBILINOGEN 1.0 07/11/2013 0214   NITRITE NEGATIVE 06/21/2018 1546   LEUKOCYTESUR NEGATIVE 06/21/2018 1546   Sepsis Labs: Invalid input(s): PROCALCITONIN, [place]  Microbiology: Recent Results (from the past 240 hour(s))  Blood culture (routine x 2)     Status: None   Collection Time: 10/09/21  5:10 PM   Specimen: BLOOD  Result Value Ref Range Status   Specimen Description BLOOD SITE NOT SPECIFIED  Final   Special Requests   Final    BOTTLES DRAWN AEROBIC ONLY Blood Culture results may not be optimal due to an inadequate volume of blood received in culture bottles   Culture   Final    NO GROWTH 5 DAYS Performed at [facility], [address] [place], [place] [address]    Report Status 10/14/2021 FINAL  Final  Resp Panel by RT-PCR (Flu A&B, Covid) Nasopharyngeal Swab     Status: None   Collection Time: 10/09/21  7:09 PM   Specimen: Nasopharyngeal Swab; Nasopharyngeal(NP) swabs in vial transport  medium  Result Value Ref Range Status   SARS Coronavirus 2 by RT PCR NEGATIVE NEGATIVE Final    Comment: (NOTE) SARS-CoV-2 target nucleic acids are NOT DETECTED.  The SARS-CoV-2 RNA is generally detectable in upper respiratory specimens during the acute phase of infection. The lowest concentration of SARS-CoV-2 viral copies this assay can detect is 138 copies/mL. A negative result does not preclude SARS-Cov-2 infection and should not be used as the sole basis for treatment or other patient management decisions. A negative result may occur with  improper specimen collection/handling, submission of specimen other than nasopharyngeal swab, presence of viral mutation(s) within the areas targeted by this assay, and inadequate number of viral copies(<138 copies/mL). A negative result must be combined with clinical observations, patient history, and epidemiological information. The expected result is Negative.  Fact Sheet for Patients:  EntrepreneurPulse.com.au  Fact Sheet for Healthcare Providers:  IncredibleEmployment.be  This test is no t yet approved or cleared by the [place] FDA and  has been authorized for detection and/or diagnosis of SARS-CoV-2 by FDA under an Emergency Use Authorization (EUA). This EUA will remain  in effect (meaning this test can be used) for the duration of the COVID-19 declaration under Section 564(b)(1) of the Act, 21 U.S.C.section 360bbb-3(b)(1), unless the authorization is terminated  or revoked sooner.       Influenza A by PCR NEGATIVE NEGATIVE Final   Influenza B by PCR NEGATIVE NEGATIVE Final    Comment: (NOTE) The Xpert Xpress SARS-CoV-2/FLU/RSV plus assay is  intended as an aid in the diagnosis of influenza from Nasopharyngeal swab specimens and should not be used as a sole basis for treatment. Nasal washings and aspirates are unacceptable for Xpert Xpress SARS-CoV-2/FLU/RSV testing.  Fact Sheet for  Patients: EntrepreneurPulse.com.au  Fact Sheet for Healthcare Providers: IncredibleEmployment.be  This test is not yet approved or cleared by the [place] FDA and has been authorized for detection and/or diagnosis of SARS-CoV-2 by FDA under an Emergency Use Authorization (EUA). This EUA will remain in effect (meaning this test can be used) for the duration of the COVID-19 declaration under Section 564(b)(1) of the Act, 21 U.S.C. section 360bbb-3(b)(1), unless the authorization is terminated or revoked.  Performed at [facility], [place] [address] [place], [place] [address]   Blood culture (routine x 2)     Status: None   Collection Time: 10/09/21  9:25 PM   Specimen: BLOOD  Result Value Ref Range Status   Specimen Description BLOOD RIGHT ANTECUBITAL  Final   Special Requests   Final    BOTTLES DRAWN AEROBIC AND ANAEROBIC Blood Culture adequate volume   Culture   Final    NO GROWTH 5 DAYS Performed at [facility], [place] [address], [place], [place] [address]    Report Status 10/14/2021 FINAL  Final  MRSA Next Gen by PCR, Nasal     Status: None   Collection Time: 10/10/21  5:27 AM   Specimen: Nasal Mucosa; Nasal Swab  Result Value Ref Range Status   MRSA by PCR Next Gen NOT DETECTED NOT DETECTED Final    Comment: (NOTE) The GeneXpert MRSA Assay (FDA approved for NASAL specimens only), is one component of a comprehensive MRSA colonization surveillance program. It is not intended to diagnose MRSA infection nor to guide or monitor treatment for MRSA infections. Test performance is not FDA approved in patients less than 6 years old. Performed at [facility], [place] [address] [place], [place] [address]   Respiratory (~20 pathogens) panel by PCR     Status: None   Collection Time: 10/10/21  5:27 AM   Specimen: Nasopharyngeal Swab; Respiratory  Result Value Ref Range Status   Adenovirus NOT DETECTED NOT DETECTED Final    Coronavirus 229E NOT DETECTED NOT DETECTED Final    Comment: (NOTE) The Coronavirus on the Respiratory Panel, DOES NOT test for the novel  Coronavirus (2019 nCoV)    Coronavirus HKU1 NOT DETECTED NOT DETECTED Final   Coronavirus NL63 NOT DETECTED NOT DETECTED Final   Coronavirus OC43 NOT DETECTED NOT DETECTED Final   Metapneumovirus NOT DETECTED NOT DETECTED Final   Rhinovirus / Enterovirus NOT DETECTED NOT DETECTED Final   Influenza A NOT DETECTED NOT DETECTED Final   Influenza B NOT DETECTED NOT DETECTED Final   Parainfluenza Virus 1 NOT DETECTED NOT DETECTED Final   Parainfluenza Virus 2 NOT DETECTED NOT DETECTED Final   Parainfluenza Virus 3 NOT DETECTED NOT DETECTED Final   Parainfluenza Virus 4 NOT DETECTED NOT DETECTED Final   Respiratory Syncytial Virus NOT DETECTED NOT DETECTED Final   Bordetella pertussis NOT DETECTED NOT DETECTED Final   Bordetella Parapertussis NOT DETECTED NOT DETECTED Final   Chlamydophila pneumoniae NOT DETECTED NOT DETECTED Final   Mycoplasma pneumoniae NOT DETECTED NOT DETECTED Final    Comment: Performed at [facility], [place]. [address], [place], [place] [address]    Radiology Studies: No results found.     Herschell Virani T. Mio  If 7PM-7AM, please  contact night-coverage [email or web address] 10/19/2021, 3:30 PM

## 2021-10-19 NOTE — Progress Notes (Signed)
Palliative:  HPI: 87 y.o. male  with past medical history of HTN, HLD admitted on 10/09/2021 with chest pain and shortness of breath and found to have NSTEMI, severe aortic stenosis, EF 40%. He has opted for conservative medical management.    I met today with Nicholas Finley. He is sleepy today and not feeling great. Poor intake and continues to complain with abd discomfort although he does endorse that he had bowel movement. He has no appetite. I discussed with him that I am concerned about his condition and that he continues to worsen. He understands. At this time he wishes to continue current care and continue efforts to relieve his abd discomfort. I left him to rest.   Exam: Sleepy but able to awaken and is oriented. Breathing labored but relieved with 2L oxygen. Abd soft, tender uncomfortable. Weak.    Plan: - Continue current interventions. No escalation. Consider comfort care if he declines further.   15 min  Vinie Sill, NP Palliative Medicine Team Pager [phone] (Please see [email or web address] for schedule) Team Phone [phone]    Greater than 50%  of this time was spent counseling and coordinating care related to the above assessment and plan

## 2021-10-20 DIAGNOSIS — I214 Non-ST elevation (NSTEMI) myocardial infarction: Secondary | ICD-10-CM | POA: Diagnosis not present

## 2021-10-20 DIAGNOSIS — E872 Acidosis, unspecified: Secondary | ICD-10-CM | POA: Diagnosis not present

## 2021-10-20 DIAGNOSIS — Z7189 Other specified counseling: Secondary | ICD-10-CM | POA: Diagnosis not present

## 2021-10-20 DIAGNOSIS — J9601 Acute respiratory failure with hypoxia: Secondary | ICD-10-CM | POA: Diagnosis not present

## 2021-10-20 DIAGNOSIS — I5023 Acute on chronic systolic (congestive) heart failure: Secondary | ICD-10-CM | POA: Diagnosis not present

## 2021-10-20 DIAGNOSIS — J189 Pneumonia, unspecified organism: Secondary | ICD-10-CM | POA: Diagnosis not present

## 2021-10-20 DIAGNOSIS — Z515 Encounter for palliative care: Secondary | ICD-10-CM | POA: Diagnosis not present

## 2021-10-20 LAB — CBC
HCT: 37.3 % — ABNORMAL LOW (ref 39.0–52.0)
Hemoglobin: 12.7 g/dL — ABNORMAL LOW (ref 13.0–17.0)
MCH: 30.8 pg (ref 26.0–34.0)
MCHC: 34 g/dL (ref 30.0–36.0)
MCV: 90.5 fL (ref 80.0–100.0)
Platelets: 398 10*3/uL (ref 150–400)
RBC: 4.12 MIL/uL — ABNORMAL LOW (ref 4.22–5.81)
RDW: 13.6 % (ref 11.5–15.5)
WBC: 28.1 10*3/uL — ABNORMAL HIGH (ref 4.0–10.5)
nRBC: 0.1 % (ref 0.0–0.2)

## 2021-10-20 LAB — RENAL FUNCTION PANEL
Albumin: 2.7 g/dL — ABNORMAL LOW (ref 3.5–5.0)
Anion gap: 16 — ABNORMAL HIGH (ref 5–15)
BUN: 111 mg/dL — ABNORMAL HIGH (ref 8–23)
CO2: 21 mmol/L — ABNORMAL LOW (ref 22–32)
Calcium: 8.8 mg/dL — ABNORMAL LOW (ref 8.9–10.3)
Chloride: 94 mmol/L — ABNORMAL LOW (ref 98–111)
Creatinine, Ser: 3.23 mg/dL — ABNORMAL HIGH (ref 0.61–1.24)
GFR, Estimated: 17 mL/min — ABNORMAL LOW (ref >60)
Glucose, Bld: 140 mg/dL — ABNORMAL HIGH (ref 70–99)
Phosphorus: 8.6 mg/dL — ABNORMAL HIGH (ref 2.5–4.6)
Potassium: 5.2 mmol/L — ABNORMAL HIGH (ref 3.5–5.1)
Sodium: 131 mmol/L — ABNORMAL LOW (ref 135–145)

## 2021-10-20 LAB — LACTIC ACID, PLASMA
Lactic Acid, Venous: 3 mmol/L (ref 0.5–1.9)
Lactic Acid, Venous: 2.9 mmol/L (ref 0.5–1.9)

## 2021-10-20 LAB — MAGNESIUM: Magnesium: 3.1 mg/dL — ABNORMAL HIGH (ref 1.7–2.4)

## 2021-10-20 LAB — BRAIN NATRIURETIC PEPTIDE: B Natriuretic Peptide: 2693 pg/mL — ABNORMAL HIGH (ref 0.0–100.0)

## 2021-10-20 LAB — HEPATIC FUNCTION PANEL
ALT: 784 U/L — ABNORMAL HIGH (ref 0–44)
AST: 563 U/L — ABNORMAL HIGH (ref 15–41)
Albumin: 2.8 g/dL — ABNORMAL LOW (ref 3.5–5.0)
Alkaline Phosphatase: 112 U/L (ref 38–126)
Bilirubin, Direct: 0.4 mg/dL — ABNORMAL HIGH (ref 0.0–0.2)
Indirect Bilirubin: 1.2 mg/dL — ABNORMAL HIGH (ref 0.3–0.9)
Total Bilirubin: 1.6 mg/dL — ABNORMAL HIGH (ref 0.3–1.2)
Total Protein: 6.9 g/dL (ref 6.5–8.1)

## 2021-10-20 LAB — CK: Total CK: 115 U/L (ref 49–397)

## 2021-10-20 MED ORDER — ACETAMINOPHEN 650 MG RE SUPP: 650.0000 mg | Freq: Four times a day (QID) | RECTAL | Status: DC | PRN

## 2021-10-20 MED ORDER — GLYCOPYRROLATE 0.2 MG/ML IJ SOLN: 0.2000 mg | INTRAMUSCULAR | Status: DC | PRN

## 2021-10-20 MED ORDER — HALOPERIDOL 0.5 MG PO TABS
0.5000 mg | ORAL_TABLET | ORAL | Status: DC | PRN
  Filled 2021-10-20: qty 1

## 2021-10-20 MED ORDER — HYDROMORPHONE HCL 1 MG/ML IJ SOLN
0.5000 mg | INTRAMUSCULAR | Status: DC | PRN
  Administered 2021-10-25: 1 mg via INTRAVENOUS
  Filled 2021-10-20: qty 1

## 2021-10-20 MED ORDER — HALOPERIDOL LACTATE 2 MG/ML PO CONC
0.5000 mg | ORAL | Status: DC | PRN
  Filled 2021-10-20: qty 0.3

## 2021-10-20 MED ORDER — BIOTENE DRY MOUTH MT LIQD: 15.0000 mL | OROMUCOSAL | Status: DC | PRN

## 2021-10-20 MED ORDER — LORAZEPAM 2 MG/ML PO CONC: 1.0000 mg | ORAL | Status: DC | PRN

## 2021-10-20 MED ORDER — ACETAMINOPHEN 325 MG PO TABS
650.0000 mg | ORAL_TABLET | Freq: Four times a day (QID) | ORAL | Status: DC | PRN
  Administered 2021-10-23 – 2021-10-24 (×2): 650 mg via ORAL
  Filled 2021-10-20 (×2): qty 2

## 2021-10-20 MED ORDER — GLYCOPYRROLATE 1 MG PO TABS
1.0000 mg | ORAL_TABLET | ORAL | Status: DC | PRN
  Filled 2021-10-20: qty 1

## 2021-10-20 MED ORDER — LORAZEPAM 2 MG/ML IJ SOLN: 1.0000 mg | INTRAMUSCULAR | Status: DC | PRN

## 2021-10-20 MED ORDER — HALOPERIDOL LACTATE 5 MG/ML IJ SOLN: 0.5000 mg | INTRAMUSCULAR | Status: DC | PRN

## 2021-10-20 MED ORDER — LORAZEPAM 1 MG PO TABS
1.0000 mg | ORAL_TABLET | ORAL | Status: DC | PRN
  Administered 2021-10-25: 1 mg via ORAL
  Filled 2021-10-20: qty 1

## 2021-10-20 MED ORDER — SODIUM ZIRCONIUM CYCLOSILICATE 10 G PO PACK
10.0000 g | PACK | Freq: Once | ORAL | Status: DC
  Filled 2021-10-20: qty 1

## 2021-10-20 MED ORDER — POLYVINYL ALCOHOL 1.4 % OP SOLN
1.0000 [drp] | Freq: Four times a day (QID) | OPHTHALMIC | Status: DC | PRN
  Filled 2021-10-20: qty 15

## 2021-10-20 NOTE — Progress Notes (Signed)
[employer]) Hospital Liaison note.   Received request from [place] for family interest in [facility] with request for transfer. At this time patient does not appear [facility]. We will provide help for discharge needs. Please do not hesitate to call with questions.    Zollie Pee [facility] Liaison  [phone]

## 2021-10-20 NOTE — Progress Notes (Signed)
Palliative:  HPI: 87 y.o. male  with past medical history of HTN, HLD admitted on 10/09/2021 with chest pain and shortness of breath and found to have NSTEMI, severe aortic stenosis, EF 40%. He has opted for conservative medical management.    I met today with Nicholas Finley. He continues to lie in bed and he continues to be more sleepy, poor appetite/intake, and not completely comfortable. He does not feel well but no specific pains except still some discomfort in his abd. We discussed his declining status and concern for worsening kidney function. We discussed that his heart is very weak and likely not perfusing to his other organs and causing overall decline. Nicholas Finley paused and then told me "I figured this would happen." I gave my condolences that I have to share this news with him. With this information we discussed path of comfort care and he did decide to pursue full comfort care and stop lab work and any medications not adding to his comfort with symptom relief. He decided to stop antibiotics as well at this time. We discussed potential of considering hospice placement and he would like to think about this. He gives me permission to share with his cousin, Nicholas Finley.   I called Nicholas Finley and shared the changes in Nicholas Finley's clinical status, assessment, and lab work. I explained to her the conversation and decisions that Nicholas Finley made this morning. Nicholas Finley understands and is supportive of his decisions. Nicholas Finley will notify the rest of the family (including her brother, Nicholas Finley, who is documented HCPOA although Nicholas Finley has been the person that Nicholas Finley has entrusted [place] to speak with during his hospitalization). We did discuss with worsening kidney function and status that it is unclear how much longer Nicholas Finley would be alert and able to converse with [place] so I would encourage any family/friends to visit sooner than later but at the same time Nicholas Finley could compensate longer than we expect and  only time will tell. We discussed natural progression at end of life. Nicholas Finley shares that all their family members have died suddenly and they are unfamiliar with a path such as this. She welcomes continued palliative care conversation and updates from medical staff. She also understands that there may be a point when Nicholas Finley is unable to make decisions for himself and family may be approached about consideration of hospice placement. Nicholas Finley verbalizes understanding.   All questions/concerns addressed. Emotional support provided.   Exam: Sleepy but able to awaken and is oriented. Breathing regular, unlabored on oxygen. Abd soft, tender uncomfortable. Weak.   Plan: - Full comfort care decided.  - Considering hospice facility placement.   [place], NP Palliative Medicine Team Pager [phone] (Please see [email or web address] for schedule) Team Phone [phone]    Greater than 50%  of this time was spent counseling and coordinating care related to the above assessment and plan

## 2021-10-20 NOTE — Progress Notes (Signed)
PROGRESS NOTE  Nicholas Finley AGT:[id number] DOB: [birth year]   PCP: [name], MD  Patient is from: Home.  DOA: 10/09/2021 LOS: 35  Chief complaints:  Chief Complaint  Patient presents with   Respiratory Distress     Brief Narrative / Interim history: 87 year old M with PMH of HTN and HLD presenting with chest pain, shortness of breath and hypoxemia to 60% on room air, and admitted for acute respiratory failure with hypoxia in the setting of non-STEMI, acute systolic CHF with severe aortic stenosis, possible aspiration pneumonia and acute kidney injury.  Patient underwent left heart catheterization that showed critical left OM stenosis, moderate proximal LAD and circumflex stenosis and severe aortic stenosis.  Palliative medicine consulted.  CODE STATUS changed to DNR/DNI.  Plan was to discharge home with palliative care follow-up.  However, patient continued to decline with progressive AKI with uremia, acute hepatitis and significant lactic acidosis to 5.7.  Eventually, he was transitioned to full comfort care on 10/20/2021.  Subjective: Seen and examined earlier this morning.  Patient was sleepy but wakes to voice.  He says he feels sick in his stomach likely from uremia.  We talked about his heart failure with severe aortic stenosis, progressive AKI, uremia and liver failure which are unlikely to reverse.  Patient understands about his grim prognosis.  He says "I am ready to die".  Objective: Vitals:   10/19/21 1957 10/20/21 0100 10/20/21 0336 10/20/21 1203  BP: 92/68  100/70 101/71  Pulse: 99  98 93  Resp: 18  20 16   Temp: (!) 97.4 F (36.3 C)  (!) 97.5 F (36.4 C)   TempSrc: Oral  Oral   SpO2: 90%  97% 93%  Weight:  62.8 kg      Intake/Output Summary (Last 24 hours) at 10/20/2021 1428 Last data filed at 10/20/2021 0900 Gross per 24 hour  Intake 86.27 ml  Output 100 ml  Net -13.73 ml   Filed Weights   10/18/21 0038 10/19/21 0530 10/20/21 0100  Weight: 62.4  kg 61.7 kg 62.8 kg    Examination:  GENERAL: Frail looking elderly male.  No apparent distress. HEENT: MMM.  Vision grossly intact.  Diminished hearing. NECK: Supple.  No apparent JVD.  RESP: 97% on 3 L.  No IWOB.  Fair aeration bilaterally. CVS:  RRR. Heart sounds normal.  ABD/GI/GU: BS+. Abd soft, NTND.  MSK/EXT:  Moves extremities. No apparent deformity. No edema.  SKIN: no apparent skin lesion or wound NEURO: Awake and alert. Oriented appropriately.  No apparent focal neuro deficit. PSYCH: Calm. Normal affect.   Procedures:  R/LHC on 10/18 Critical ostial left  main stenosis - heavily calcified Moderate proximal LAD and LCx stenosis also heavily calcified Severe aortic stenosis. Mean gradient 28 mm Hg with AVA 0.72 cm squared, index 0.41 Elevated LV filling pressures - mean PCWP 24 mm Hg Mild pulmonary HTN. Mean PAP 33 mm Hg Low cardiac output 3.49 L/min with index 1.97.  Microbiology summarized: COVID-19 and influenza PCR nonreactive. Full RVP panel negative. Blood cultures negative.  Assessment & Plan: End-of-life care/full comfort care -Appreciate help by palliative medicine -Palliative pathway with emphasis on symptom management  Acute respiratory failure with hypoxia-due to pulmonary edema, pneumonia and non-STEMI.  Reportedly desaturated to 60% when EMS arrived.  -Continue supplemental oxygen as needed  Acute on chronic systolic CHF/severe aortic stenosis: TTE with LVEF of 40 to 45%, R WMA, G1 DD and moderate aortic valve stenosis. R/LHC as above.  Appears euvolemic on exam but  multiorgan failure with hypotension, AKI, liver failure and lactic acidosis likely from hypoperfusion.  Non-STEMI/CAD: R/LHC as above.  Off IV heparin.  Not interested in high risk procedures.  Severe sepsis in the setting of possible aspiration pneumonia: Patient with leukocytosis, significant lactic acidosis, AKI and respiratory failure.  CXR with bibasilar air space disease raising  concern for possible pneumonia in addition to pulmonary edema from CHF. Patient is not interested in inotropic support, ICU care or escalation of therapies.  -IV Unasyn 10/24-10/27  AKI/uremia: Likely a combination of cardiorenal and hepatorenal syndrome.  Elevated liver enzymes/hyperbilirubinemia-could be congestive hepatopathy.  Progressive.  Lactic acidosis-could be from sepsis, hypoperfusion and AKI.  Continue antibiotics for sepsis.  Limited option for low cardiac output/hypoperfusion  Leukocytosis: Could be due to sepsis or leukemoid reaction.  Hyponatremia: Stable  Abdominal pain-likely due to constipation, uremia or hypoperfusion.  -Continue bowel regimen  Body mass index is 22.68 kg/m.         DVT prophylaxis:  Patient is full comfort care  Code Status: DNR/DNI Family Communication: Patient and/or RN. Available if any question.  Level of care: Telemetry Cardiac Status is: Inpatient  Remains inpatient appropriate because: Safe disposition/residential hospice       Consultants:  Cardiology Palliative medicine   Sch Meds:  Scheduled Meds:  senna  2 tablet Oral Daily   sodium chloride flush  3 mL Intravenous Q12H   Continuous Infusions:  sodium chloride 10 mL/hr at 10/20/21 0646   PRN Meds:.sodium chloride, acetaminophen **OR** acetaminophen, alum & mag hydroxide-simeth, antiseptic oral rinse, glycopyrrolate **OR** glycopyrrolate **OR** glycopyrrolate, haloperidol **OR** haloperidol **OR** haloperidol lactate, HYDROmorphone (DILAUDID) injection, LORazepam **OR** LORazepam **OR** LORazepam, nitroGLYCERIN, ondansetron (ZOFRAN) IV, polyvinyl alcohol, simethicone, sodium phosphate  Antimicrobials: Anti-infectives (From admission, onward)    Start     Dose/Rate Route Frequency Ordered Stop   10/17/21 1830  Ampicillin-Sulbactam (UNASYN) 3 g in sodium chloride 0.9 % 100 mL IVPB  Status:  Discontinued        3 g 200 mL/hr over 30 Minutes Intravenous Every 12  hours 10/17/21 1739 10/20/21 0956   10/12/21 1100  amoxicillin-clavulanate (AUGMENTIN) 875-125 MG per tablet 1 tablet        1 tablet Oral Every 12 hours 10/12/21 1001 10/14/21 2012   10/11/21 0800  vancomycin (VANCOCIN) IVPB 1000 mg/200 mL premix  Status:  Discontinued        1,000 mg 200 mL/hr over 60 Minutes Intravenous Every 36 hours 10/09/21 1919 10/10/21 1157   10/10/21 2200  ceFEPIme (MAXIPIME) 2 g in sodium chloride 0.9 % 100 mL IVPB  Status:  Discontinued        2 g 200 mL/hr over 30 Minutes Intravenous Every 12 hours 10/10/21 1157 10/12/21 1001   10/10/21 2000  vancomycin (VANCOCIN) IVPB 1000 mg/200 mL premix  Status:  Discontinued        1,000 mg 200 mL/hr over 60 Minutes Intravenous Every 24 hours 10/10/21 1157 10/10/21 1345   10/09/21 2300  ceFEPIme (MAXIPIME) 2 g in sodium chloride 0.9 % 100 mL IVPB  Status:  Discontinued        2 g 200 mL/hr over 30 Minutes Intravenous Every 24 hours 10/09/21 2150 10/10/21 1157   10/09/21 1815  vancomycin (VANCOREADY) IVPB 1250 mg/250 mL        1,250 mg 166.7 mL/hr over 90 Minutes Intravenous  Once 10/09/21 1812 10/09/21 2105   10/09/21 1745  cefTRIAXone (ROCEPHIN) 1 g in sodium chloride 0.9 % 100 mL IVPB  1 g 200 mL/hr over 30 Minutes Intravenous  Once 10/09/21 1741 10/09/21 1920   10/09/21 1745  azithromycin (ZITHROMAX) 500 mg in sodium chloride 0.9 % 250 mL IVPB        500 mg 250 mL/hr over 60 Minutes Intravenous  Once 10/09/21 1741 10/09/21 2105        I have personally reviewed the following labs and images: CBC: Recent Labs  Lab 10/17/21 0422 10/17/21 0942 10/18/21 0355 10/19/21 0403 10/20/21 0436  WBC 42.3* 33.0* 30.7* 26.0* 28.1*  NEUTROABS  --  29.0*  --   --   --   HGB 13.4 12.8* 12.7* 12.8* 12.7*  HCT 39.9 38.6* 36.6* 37.5* 37.3*  MCV 93.0 93.7 90.6 91.5 90.5  PLT 346 334 339 371 398   BMP &GFR Recent Labs  Lab 10/16/21 0251 10/17/21 0422 10/18/21 0355 10/19/21 0403 10/20/21 0436  NA 136 132* 130*  132* 131*  K 3.6 4.0 3.9 4.7 5.2*  CL 102 100 97* 97* 94*  CO2 22 20* 23 21* 21*  GLUCOSE 110* 194* 168* 142* 140*  BUN 24* 41* 54* 80* 111*  CREATININE 0.79 1.35* 1.35* 2.31* 3.23*  CALCIUM 8.9 8.7* 9.1 9.0 8.8*  MG  --   --   --   --  3.1*  PHOS  --   --   --   --  8.6*   Estimated Creatinine Clearance: 12.7 mL/min (A) (by C-G formula based on SCr of 3.23 mg/dL (H)). Liver & Pancreas: Recent Labs  Lab 10/14/21 0650 10/18/21 1313 10/20/21 0436  AST 34 224* 563*  ALT 35 198* 784*  ALKPHOS 53 51 112  BILITOT 2.2* 1.7* 1.6*  PROT 6.7 6.9 6.9  ALBUMIN 3.1* 2.8* 2.8*  2.7*   No results for input(s): LIPASE, AMYLASE in the last 168 hours. No results for input(s): AMMONIA in the last 168 hours. Diabetic: No results for input(s): HGBA1C in the last 72 hours. Recent Labs  Lab 10/13/21 1543  GLUCAP 99   Cardiac Enzymes: Recent Labs  Lab 10/20/21 0436  CKTOTAL 115   No results for input(s): PROBNP in the last 8760 hours. Coagulation Profile: No results for input(s): INR, PROTIME in the last 168 hours. Thyroid Function Tests: No results for input(s): TSH, T4TOTAL, FREET4, T3FREE, THYROIDAB in the last 72 hours. Lipid Profile: No results for input(s): CHOL, HDL, LDLCALC, TRIG, CHOLHDL, LDLDIRECT in the last 72 hours. Anemia Panel: No results for input(s): VITAMINB12, FOLATE, FERRITIN, TIBC, IRON, RETICCTPCT in the last 72 hours. Urine analysis:    Component Value Date/Time   COLORURINE STRAW (A) 06/21/2018 1546   APPEARANCEUR CLEAR 06/21/2018 1546   LABSPEC 1.006 06/21/2018 1546   PHURINE 8.0 06/21/2018 1546   GLUCOSEU NEGATIVE 06/21/2018 1546   HGBUR LARGE (A) 06/21/2018 1546   BILIRUBINUR NEGATIVE 06/21/2018 1546   BILIRUBINUR neg 05/13/2014 1906   KETONESUR NEGATIVE 06/21/2018 1546   PROTEINUR NEGATIVE 06/21/2018 1546   UROBILINOGEN 1.0 05/13/2014 1906   UROBILINOGEN 1.0 07/11/2013 0214   NITRITE NEGATIVE 06/21/2018 1546   LEUKOCYTESUR NEGATIVE 06/21/2018 1546    Sepsis Labs: Invalid input(s): PROCALCITONIN, [place]  Microbiology: No results found for this or any previous visit (from the past 240 hour(s)).   Radiology Studies: No results found.     Syriana Croslin T. [place]  If 7PM-7AM, please contact night-coverage [email or web address] 10/20/2021, 2:28 PM

## 2021-10-21 DIAGNOSIS — Z515 Encounter for palliative care: Secondary | ICD-10-CM | POA: Diagnosis not present

## 2021-10-21 DIAGNOSIS — J189 Pneumonia, unspecified organism: Secondary | ICD-10-CM | POA: Diagnosis not present

## 2021-10-21 DIAGNOSIS — Z66 Do not resuscitate: Secondary | ICD-10-CM

## 2021-10-21 DIAGNOSIS — I214 Non-ST elevation (NSTEMI) myocardial infarction: Secondary | ICD-10-CM | POA: Diagnosis not present

## 2021-10-21 DIAGNOSIS — J9601 Acute respiratory failure with hypoxia: Secondary | ICD-10-CM | POA: Diagnosis not present

## 2021-10-21 DIAGNOSIS — N179 Acute kidney failure, unspecified: Secondary | ICD-10-CM | POA: Diagnosis not present

## 2021-10-21 DIAGNOSIS — N19 Unspecified kidney failure: Secondary | ICD-10-CM

## 2021-10-21 DIAGNOSIS — R110 Nausea: Secondary | ICD-10-CM

## 2021-10-21 DIAGNOSIS — Z789 Other specified health status: Secondary | ICD-10-CM

## 2021-10-21 DIAGNOSIS — R072 Precordial pain: Secondary | ICD-10-CM | POA: Diagnosis not present

## 2021-10-21 MED ORDER — PROCHLORPERAZINE EDISYLATE 10 MG/2ML IJ SOLN
10.0000 mg | Freq: Four times a day (QID) | INTRAMUSCULAR | Status: DC | PRN
  Filled 2021-10-21: qty 2

## 2021-10-21 NOTE — Plan of Care (Signed)

## 2021-10-21 NOTE — TOC Progression Note (Signed)
Transition of Care [facility]) - Progression Note    Patient Details  Name: Yash Cacciola MRN: [id number] Date of Birth: [birth year]  Transition of Care [facility]) CM/SW [place], [place] Phone Number: 10/21/2021, 11:53 AM  Clinical Narrative:   CSW notified by palliative NP that patient is felt to be appropriate for residential hospice, asking [facility] liaison to evaluate patient again. CSW continuing to follow.    Expected Discharge Plan: [place] Barriers to Discharge: Continued Medical Work up  Expected Discharge Plan and Services Expected Discharge Plan: [place]   Discharge Planning Services: CM Consult Post Acute Care Choice: [place] arrangements for the past 2 months: Single Family Home                 DME Arranged:  (Patient already has a cane.) DME Agency: NA       HH Arranged: RN, PT, OT, Nurse's Aide, Social Work [employer] Agency: [place] Date [place]: 10/17/21 Time [place]: 1658 Representative spoke with at [place]: [place] ([place]) Interventions    Readmission Risk Interventions No flowsheet data found.

## 2021-10-21 NOTE — Progress Notes (Addendum)
[place])  Hospital Liaison note  Received request from [place] for family interest in [facility]  [place] notified of request and patient chart is under review. Patient has been approved.  [facility] is unable to offer a bed today. A representative will follow up with [facility] manager and family when a bed becomes available.  Please do not hesitate to call with questions.     Zollie Pee [facility] Liaison  [phone]

## 2021-10-21 NOTE — Care Management Important Message (Signed)
Important Message  Patient Details  Name: Kohlton Gilpatrick MRN: [id number] Date of Birth: 10-30-1926   Medicare Important Message Given:  Yes     Shelda Altes 10/21/2021, 8:57 AM

## 2021-10-21 NOTE — Progress Notes (Addendum)
Daily Progress Note   Patient Name: Nicholas Finley       Date: 10/21/2021 DOB: 02/16/1926  Age: 87 y.o. MRN#: [id number] Attending Physician: Mercy Riding, MD Primary Care Physician: [name], MD Admit Date: 10/09/2021  Reason for Consultation/Follow-up: Disposition, Non pain symptom management, Pain control, Psychosocial/spiritual support, and Terminal Care  Subjective: Chart review performed. Received report from primary RN - no acute concerns. Asked about giving enema. RN reports patient is not eating/drinking.   Went to visit patient at bedside - no family/visitors present. Patient was lying in bed asleep - he does wake to voice/gentle touch. He is alert, oriented, and able to participate in conversation. He denies pain; does report nausea and feeling tired. He does not have an appetite. No signs or non-verbal gestures of pain or discomfort noted. No respiratory distress, increased work of breathing, or secretions noted. He is on 4L O2 [place].   Emotional support and therapeutic listening provided. Discussed his thoughts on enema - education provided it may or may not help with his nausea, he is not having abdominal pain or discomfort. He states he does not want enema today, "maybe tomorrow." His last BM was Tuesday. Gently discussed his thoughts on transfer to residential hospice facility - he states "I haven't thought about it." He expresses appreciation for visit today and is agreeable for additional medications for his nausea. Patient's friend Pilar Plate arrived to visit as I was leaving.  Called patient's friend/HCPOA's sister who lives locally, [name] whom has been primary contact for hospitalization. Provided updates that patient was not accepted to [facility] yesterday. Prognostication  reviewed. Education provided that patient will likely be within two week life expectancy (if not there today) any day as he is not eating/drinking, declining kidney function, infection, and it is anticipated he will continue to decline. She asked about transfer to LTC with hospice - reviewed pros and cons in context of patient's current situation. After discussion, [name] feels it's best to continue evaluations for [facility]. Discussed sending referrals to other hospice facilities - she wishes to continue waiting for [facility] as it is convenient for family/friends to visit. At this time, she would like patient to remain in house for EOL care until he can be accepted and transferred to residential hospice facility. [name] explains she is in close contact with  patient's HCPOA and will update him on information given to her today - she will notify PMT with any additional questions or changes in [place].   All questions and concerns addressed. Encouraged to call with questions and/or concerns. PMT number previously provided.   Length of Stay: 12  Current Medications: Scheduled Meds:   senna  2 tablet Oral Daily   sodium chloride flush  3 mL Intravenous Q12H    Continuous Infusions:  sodium chloride 10 mL/hr at 10/20/21 0646    PRN Meds: sodium chloride, acetaminophen **OR** acetaminophen, alum & mag hydroxide-simeth, antiseptic oral rinse, glycopyrrolate **OR** glycopyrrolate **OR** glycopyrrolate, haloperidol **OR** haloperidol **OR** haloperidol lactate, HYDROmorphone (DILAUDID) injection, LORazepam **OR** LORazepam **OR** LORazepam, nitroGLYCERIN, ondansetron (ZOFRAN) IV, polyvinyl alcohol, simethicone, sodium phosphate  Physical Exam Vitals and nursing note reviewed.  Constitutional:      General: He is not in acute distress.    Appearance: He is ill-appearing.  Pulmonary:     Effort: No respiratory distress.  Skin:    General: Skin is warm and dry.  Neurological:     Mental Status: He is oriented to  person, place, and time. He is lethargic.     Motor: Weakness present.  Psychiatric:        Attention and Perception: Attention normal.        Behavior: Behavior is cooperative.        Cognition and Memory: Cognition and memory normal.            Vital Signs: BP 96/66 (BP Location: Left Arm)   Pulse 93   Temp 98.7 F (37.1 C)   Resp 19   Wt 62.8 kg   SpO2 94%   BMI 22.70 kg/m  SpO2: SpO2: 94 % O2 Device: O2 Device: Nasal Cannula O2 Flow Rate: O2 Flow Rate (L/min): 3 L/min  Intake/output summary:  Intake/Output Summary (Last 24 hours) at 10/21/2021 1113 Last data filed at 10/21/2021 0300 Gross per 24 hour  Intake 515.45 ml  Output 250 ml  Net 265.45 ml   LBM: Last BM Date: 10/18/21 Baseline Weight: Weight: 67.6 kg Most recent weight: Weight: 62.8 kg       Palliative Assessment/Data: PPS 10%      Patient Active Problem List   Diagnosis Date Noted   Nonrheumatic aortic valve stenosis    Pulmonary edema    Chest pain 10/09/2021   Sepsis with acute hypoxic respiratory failure and septic shock ([place]) 10/09/2021   Community acquired pneumonia 10/09/2021   Respiratory failure ([place]) 10/09/2021   NSTEMI (non-ST elevated myocardial infarction) (HCC)    Acute respiratory failure with hypoxia (HCC)    AKI (acute kidney injury) ([place])    HTN (hypertension) 03/01/2012   Hyperlipidemia 03/01/2012   Hearing difficulty 03/01/2012   Allergic rhinitis 03/01/2012    Palliative Care Assessment & Plan   Patient Profile: 87 y.o. male  with past medical history of HTN, HLD admitted on 10/09/2021 with chest pain and shortness of breath and found to have NSTEMI, severe aortic stenosis, EF 40%. He has opted for conservative medical management.   Assessment: Terminal care Acute respiratory failure with hypoxia Non-STEMI/CAD Severe sepsis secondary to aspiration pneumonia AKI Elevated liver enzymes  Recommendations/Plan: Continue full comfort measures Continue DNR/DNI as  previously documented Family requested re-evaluation for [place] and [place] liaison notified. Family are not interested in sending referrals to other organizations Family would like patient to remain in house for EOL care until he can [facility] to [facility]  Compazine 10mg  IV q6h PRN nausea/vomiting PMT will continue to follow and support holistically  Goals of Care and Additional Recommendations: Limitations on Scope of Treatment: Full Comfort Care  Code Status:    Code Status Orders  (From admission, onward)           Start     Ordered   10/20/21 0955  Do not attempt resuscitation (DNR)  Continuous       Question Answer Comment  In the event of cardiac or respiratory ARREST Do not call a "code blue"   In the event of cardiac or respiratory ARREST Do not perform Intubation, CPR, defibrillation or ACLS   In the event of cardiac or respiratory ARREST Use medication by any route, position, wound care, and other measures to relive pain and suffering. May use oxygen, suction and manual treatment of airway obstruction as needed for comfort.      10/20/21 0956           Code Status History     Date Active Date Inactive Code Status Order ID Comments User Context   10/13/2021 0000 10/20/2021 0956 DNR [id number]  Erick Colace, NP Inpatient   10/12/2021 1135 10/13/2021 0000 DNR [id number]  Erick Colace, NP Inpatient   10/09/2021 2013 10/12/2021 1135 Full Code [id number]  Gleason, Otilio Carpen, PA-C ED      Advance Directive Documentation    Flowsheet Row Most Recent Value  Type of Advance Directive Healthcare Power of Attorney, Living will  Pre-existing out of facility DNR order (yellow form or pink MOST form) --  "MOST" Form in Place? --       Prognosis:  < 2 weeks heart failure with severe aortic stenosis, progressive AKI, uremia and liver failure, now possible pneumonia, not eating/drinking  Discharge Planning: Hospice facility  Care plan was discussed with  primary RN, Dr.Gonfa, TOC, [place] liaison, patient, patient's primary contact/Susan  Thank you for allowing the Palliative Medicine Team to assist in the care of this patient.   Total Time 45 minutes Prolonged Time Billed  no       Greater than 50%  of this time was spent counseling and coordinating care related to the above assessment and plan.  Lin Landsman, NP  Please contact Palliative Medicine Team phone at [phone] for questions and concerns.

## 2021-10-21 NOTE — Progress Notes (Signed)
PROGRESS NOTE  Nicholas Finley QIO:[id number] DOB: [birth year]   PCP: [name], MD  Patient is from: Home.  DOA: 10/09/2021 LOS: 12  Chief complaints:  Chief Complaint  Patient presents with   Respiratory Distress     Brief Narrative / Interim history: 87 year old M with PMH of HTN and HLD presenting with chest pain, shortness of breath and hypoxemia to 60% on room air, and admitted for acute respiratory failure with hypoxia in the setting of non-STEMI, acute systolic CHF with severe aortic stenosis, possible aspiration pneumonia and acute kidney injury.  Patient underwent left heart catheterization that showed critical left OM stenosis, moderate proximal LAD and circumflex stenosis and severe aortic stenosis.  Palliative medicine consulted.  CODE STATUS changed to DNR/DNI.  Plan was to discharge home with palliative care follow-up.  However, patient continued to decline with progressive AKI with uremia, acute hepatitis and significant lactic acidosis to 5.7.  Eventually, he was transitioned to full comfort care on 10/20/2021.  Waiting on bed at [facility]  Subjective: Seen and examined earlier this morning.  No major events overnight of this morning.  Reports nausea but no other complaints.  Objective: Vitals:   10/20/21 0336 10/20/21 1203 10/21/21 0101 10/21/21 1124  BP: 100/70 101/71 96/66 104/70  Pulse: 98 93 93 91  Resp: 20 16 19 18   Temp: (!) 97.5 F (36.4 C)  98.7 F (37.1 C) (!) 97.4 F (36.3 C)  TempSrc: Oral   Oral  SpO2: 97% 93% 94% 95%  Weight:   62.8 kg     Intake/Output Summary (Last 24 hours) at 10/21/2021 1716 Last data filed at 10/21/2021 0300 Gross per 24 hour  Intake 120 ml  Output 250 ml  Net -130 ml   Filed Weights   10/19/21 0530 10/20/21 0100 10/21/21 0101  Weight: 61.7 kg 62.8 kg 62.8 kg    Examination:  GENERAL: Frail looking elderly male.  No apparent distress. HEENT: MMM.  Vision and hearing grossly intact.  NECK: Supple.  No  apparent JVD.  RESP: 94% on 3 L.  No IWOB.  Fair aeration bilaterally. CVS:  RRR. Heart sounds normal.  ABD/GI/GU: BS+. Abd soft, NTND.  MSK/EXT:  Moves extremities.  Significant muscle mass and subcu fat loss. SKIN: Notable jaundice. NEURO: Awake and alert. Oriented fairly.  No apparent focal neuro deficit. PSYCH: Calm. Normal affect.   Procedures:  R/LHC on 10/18 Critical ostial left  main stenosis - heavily calcified Moderate proximal LAD and LCx stenosis also heavily calcified Severe aortic stenosis. Mean gradient 28 mm Hg with AVA 0.72 cm squared, index 0.41 Elevated LV filling pressures - mean PCWP 24 mm Hg Mild pulmonary HTN. Mean PAP 33 mm Hg Low cardiac output 3.49 L/min with index 1.97.  Microbiology summarized: COVID-19 and influenza PCR nonreactive. Full RVP panel negative. Blood cultures negative.  Assessment & Plan: End-of-life care/full comfort care -Appreciate help by palliative medicine -Palliative pathway with emphasis on symptom management -Transfer to [facility] once bed available.  Acute respiratory failure with hypoxia-due to pulmonary edema, pneumonia and non-STEMI.  Reportedly desaturated to 60% when EMS arrived.  -Continue supplemental oxygen as needed for comfort  Acute on chronic systolic CHF/severe aortic stenosis: TTE with LVEF of 40 to 45%, R WMA, G1 DD and moderate aortic valve stenosis. R/LHC as above.  Appears euvolemic on exam but multiorgan failure with hypotension, AKI, liver failure and lactic acidosis likely from hypoperfusion.  Non-STEMI/CAD: R/LHC as above.  Off IV heparin.  Not interested  in high risk procedures.  Severe sepsis in the setting of possible aspiration pneumonia: Patient with leukocytosis, significant lactic acidosis, AKI and respiratory failure.  CXR with bibasilar air space disease raising concern for possible pneumonia in addition to pulmonary edema from CHF. Patient is not interested in inotropic support, ICU care or  escalation of therapies.  -IV Unasyn 10/24-10/27  AKI/uremia: Likely a combination of cardiorenal and hepatorenal syndrome.  Elevated liver enzymes/hyperbilirubinemia-could be congestive hepatopathy.  Progressive.  Lactic acidosis-could be from sepsis, hypoperfusion and AKI.  Continue antibiotics for sepsis.  Limited option for low cardiac output/hypoperfusion  Leukocytosis: Could be due to sepsis or leukemoid reaction.  Hyponatremia: Stable  Abdominal pain-likely due to constipation, uremia or hypoperfusion.  -Continue bowel regimen  Body mass index is 22.7 kg/m.         DVT prophylaxis:  Patient is full comfort care  Code Status: DNR/DNI Family Communication: Patient and/or RN. Available if any question.  Level of care: Telemetry Cardiac Status is: Inpatient  Remains inpatient appropriate because: Safe disposition/residential hospice       Consultants:  Cardiology-signed off Palliative medicine   Sch Meds:  Scheduled Meds:  senna  2 tablet Oral Daily   sodium chloride flush  3 mL Intravenous Q12H   Continuous Infusions:  sodium chloride 10 mL/hr at 10/20/21 0646   PRN Meds:.sodium chloride, acetaminophen **OR** acetaminophen, alum & mag hydroxide-simeth, antiseptic oral rinse, glycopyrrolate **OR** glycopyrrolate **OR** glycopyrrolate, haloperidol **OR** haloperidol **OR** haloperidol lactate, HYDROmorphone (DILAUDID) injection, LORazepam **OR** LORazepam **OR** LORazepam, nitroGLYCERIN, ondansetron (ZOFRAN) IV, polyvinyl alcohol, prochlorperazine, simethicone, sodium phosphate  Antimicrobials: Anti-infectives (From admission, onward)    Start     Dose/Rate Route Frequency Ordered Stop   10/17/21 1830  Ampicillin-Sulbactam (UNASYN) 3 g in sodium chloride 0.9 % 100 mL IVPB  Status:  Discontinued        3 g 200 mL/hr over 30 Minutes Intravenous Every 12 hours 10/17/21 1739 10/20/21 0956   10/12/21 1100  amoxicillin-clavulanate (AUGMENTIN) 875-125 MG per  tablet 1 tablet        1 tablet Oral Every 12 hours 10/12/21 1001 10/14/21 2012   10/11/21 0800  vancomycin (VANCOCIN) IVPB 1000 mg/200 mL premix  Status:  Discontinued        1,000 mg 200 mL/hr over 60 Minutes Intravenous Every 36 hours 10/09/21 1919 10/10/21 1157   10/10/21 2200  ceFEPIme (MAXIPIME) 2 g in sodium chloride 0.9 % 100 mL IVPB  Status:  Discontinued        2 g 200 mL/hr over 30 Minutes Intravenous Every 12 hours 10/10/21 1157 10/12/21 1001   10/10/21 2000  vancomycin (VANCOCIN) IVPB 1000 mg/200 mL premix  Status:  Discontinued        1,000 mg 200 mL/hr over 60 Minutes Intravenous Every 24 hours 10/10/21 1157 10/10/21 1345   10/09/21 2300  ceFEPIme (MAXIPIME) 2 g in sodium chloride 0.9 % 100 mL IVPB  Status:  Discontinued        2 g 200 mL/hr over 30 Minutes Intravenous Every 24 hours 10/09/21 2150 10/10/21 1157   10/09/21 1815  vancomycin (VANCOREADY) IVPB 1250 mg/250 mL        1,250 mg 166.7 mL/hr over 90 Minutes Intravenous  Once 10/09/21 1812 10/09/21 2105   10/09/21 1745  cefTRIAXone (ROCEPHIN) 1 g in sodium chloride 0.9 % 100 mL IVPB        1 g 200 mL/hr over 30 Minutes Intravenous  Once 10/09/21 1741 10/09/21 1920   10/09/21  1745  azithromycin (ZITHROMAX) 500 mg in sodium chloride 0.9 % 250 mL IVPB        500 mg 250 mL/hr over 60 Minutes Intravenous  Once 10/09/21 1741 10/09/21 2105        I have personally reviewed the following labs and images: CBC: Recent Labs  Lab 10/17/21 0422 10/17/21 0942 10/18/21 0355 10/19/21 0403 10/20/21 0436  WBC 42.3* 33.0* 30.7* 26.0* 28.1*  NEUTROABS  --  29.0*  --   --   --   HGB 13.4 12.8* 12.7* 12.8* 12.7*  HCT 39.9 38.6* 36.6* 37.5* 37.3*  MCV 93.0 93.7 90.6 91.5 90.5  PLT 346 334 339 371 398   BMP &GFR Recent Labs  Lab 10/16/21 0251 10/17/21 0422 10/18/21 0355 10/19/21 0403 10/20/21 0436  NA 136 132* 130* 132* 131*  K 3.6 4.0 3.9 4.7 5.2*  CL 102 100 97* 97* 94*  CO2 22 20* 23 21* 21*  GLUCOSE 110* 194*  168* 142* 140*  BUN 24* 41* 54* 80* 111*  CREATININE 0.79 1.35* 1.35* 2.31* 3.23*  CALCIUM 8.9 8.7* 9.1 9.0 8.8*  MG  --   --   --   --  3.1*  PHOS  --   --   --   --  8.6*   Estimated Creatinine Clearance: 12.7 mL/min (A) (by C-G formula based on SCr of 3.23 mg/dL (H)). Liver & Pancreas: Recent Labs  Lab 10/18/21 1313 10/20/21 0436  AST 224* 563*  ALT 198* 784*  ALKPHOS 51 112  BILITOT 1.7* 1.6*  PROT 6.9 6.9  ALBUMIN 2.8* 2.8*  2.7*   No results for input(s): LIPASE, AMYLASE in the last 168 hours. No results for input(s): AMMONIA in the last 168 hours. Diabetic: No results for input(s): HGBA1C in the last 72 hours. No results for input(s): GLUCAP in the last 168 hours.  Cardiac Enzymes: Recent Labs  Lab 10/20/21 0436  CKTOTAL 115   No results for input(s): PROBNP in the last 8760 hours. Coagulation Profile: No results for input(s): INR, PROTIME in the last 168 hours. Thyroid Function Tests: No results for input(s): TSH, T4TOTAL, FREET4, T3FREE, THYROIDAB in the last 72 hours. Lipid Profile: No results for input(s): CHOL, HDL, LDLCALC, TRIG, CHOLHDL, LDLDIRECT in the last 72 hours. Anemia Panel: No results for input(s): VITAMINB12, FOLATE, FERRITIN, TIBC, IRON, RETICCTPCT in the last 72 hours. Urine analysis:    Component Value Date/Time   COLORURINE STRAW (A) 06/21/2018 1546   APPEARANCEUR CLEAR 06/21/2018 1546   LABSPEC 1.006 06/21/2018 1546   PHURINE 8.0 06/21/2018 1546   GLUCOSEU NEGATIVE 06/21/2018 1546   HGBUR LARGE (A) 06/21/2018 1546   BILIRUBINUR NEGATIVE 06/21/2018 1546   BILIRUBINUR neg 05/13/2014 1906   KETONESUR NEGATIVE 06/21/2018 1546   PROTEINUR NEGATIVE 06/21/2018 1546   UROBILINOGEN 1.0 05/13/2014 1906   UROBILINOGEN 1.0 07/11/2013 0214   NITRITE NEGATIVE 06/21/2018 1546   LEUKOCYTESUR NEGATIVE 06/21/2018 1546   Sepsis Labs: Invalid input(s): PROCALCITONIN, [place]  Microbiology: No results found for this or any previous visit  (from the past 240 hour(s)).   Radiology Studies: No results found.     Faelynn Wynder T. [place]  If 7PM-7AM, please contact night-coverage [email or web address] 10/21/2021, 5:16 PM

## 2021-10-22 DIAGNOSIS — N19 Unspecified kidney failure: Secondary | ICD-10-CM | POA: Diagnosis not present

## 2021-10-22 DIAGNOSIS — Z515 Encounter for palliative care: Secondary | ICD-10-CM | POA: Diagnosis not present

## 2021-10-22 DIAGNOSIS — I214 Non-ST elevation (NSTEMI) myocardial infarction: Secondary | ICD-10-CM | POA: Diagnosis not present

## 2021-10-22 DIAGNOSIS — N179 Acute kidney failure, unspecified: Secondary | ICD-10-CM | POA: Diagnosis not present

## 2021-10-22 MED ORDER — SENNA 8.6 MG PO TABS
2.0000 | ORAL_TABLET | Freq: Two times a day (BID) | ORAL | Status: DC
  Administered 2021-10-22 – 2021-10-25 (×7): 17.2 mg via ORAL
  Filled 2021-10-22 (×6): qty 2

## 2021-10-22 MED ORDER — PANTOPRAZOLE SODIUM 40 MG IV SOLR
40.0000 mg | INTRAVENOUS | Status: DC
  Administered 2021-10-22 – 2021-10-25 (×4): 40 mg via INTRAVENOUS
  Filled 2021-10-22 (×4): qty 40

## 2021-10-22 NOTE — Progress Notes (Signed)
[facility]) Hospital Liaison Note   Unfortunately, [place] is not able to offer a room today. [facility] Manager aware hospital liaison will follow up tomorrow or sooner if a room becomes available.    Please do not hesitate to call with any hospice related questions.    Thank you for the opportunity to participate in this patient's care.   Bobbie "Loren Racer, RN, BSN [facility] Liaison [phone]

## 2021-10-22 NOTE — TOC Progression Note (Signed)
Transition of Care [facility]) - Progression Note    Patient Details  Name: Nicholas Finley MRN: [id number] Date of Birth: March 28, 1926  Transition of Care [facility]) CM/SW [place], LCSW Phone Number: 10/22/2021, 12:11 PM  Clinical Narrative:    CSW continuing to follow for discharge once [facility] has a bed available.    Expected Discharge Plan: [place] Barriers to Discharge: Continued Medical Work up  Expected Discharge Plan and Services Expected Discharge Plan: [place]   Discharge Planning Services: CM Consult Post Acute Care Choice: [place] arrangements for the past 2 months: Single Family Home                 DME Arranged:  (Patient already has a cane.) DME Agency: NA       HH Arranged: RN, PT, OT, Nurse's Aide, Social Work [employer] Agency: [place] Date [place]: 10/17/21 Time [place]: 1658 Representative spoke with at [place]: [place] ([place]) Interventions    Readmission Risk Interventions No flowsheet data found.

## 2021-10-22 NOTE — Progress Notes (Signed)
PROGRESS NOTE  Cj Edgell HFW:[id number] DOB: 18-Jul-1926   PCP: [name], MD  Patient is from: Home.  DOA: 10/09/2021 LOS: 70  Chief complaints:  Chief Complaint  Patient presents with   Respiratory Distress     Brief Narrative / Interim history: 87 year old M with PMH of HTN and HLD presenting with chest pain, shortness of breath and hypoxemia to 60% on room air, and admitted for acute respiratory failure with hypoxia in the setting of non-STEMI, acute systolic CHF with severe aortic stenosis, possible aspiration pneumonia and acute kidney injury.  Patient underwent left heart catheterization that showed critical left OM stenosis, moderate proximal LAD and circumflex stenosis and severe aortic stenosis.  Palliative medicine consulted.  CODE STATUS changed to DNR/DNI.  Plan was to discharge home with palliative care follow-up.  However, patient continued to decline with progressive AKI with uremia, acute hepatitis and significant lactic acidosis to 5.7.  Eventually, he was transitioned to full comfort care on 10/20/2021.  Waiting on bed at [facility]  Subjective: Seen and examined earlier this morning.  No major events overnight of this morning.  Feels sick in his stomach.  Has not had a bowel movement despite senna but has not been eating either.  Objective: Vitals:   10/21/21 0101 10/21/21 1124 10/21/21 1953 10/22/21 1007  BP: 96/66 104/70 107/65 99/61  Pulse: 93 91 93 90  Resp: 19 18 20 18   Temp: 98.7 F (37.1 C) (!) 97.4 F (36.3 C) (!) 97.5 F (36.4 C) 97.6 F (36.4 C)  TempSrc:  Oral Oral Axillary  SpO2: 94% 95% 94% 99%  Weight: 62.8 kg       Intake/Output Summary (Last 24 hours) at 10/22/2021 1800 Last data filed at 10/22/2021 1300 Gross per 24 hour  Intake --  Output 700 ml  Net -700 ml   Filed Weights   10/19/21 0530 10/20/21 0100 10/21/21 0101  Weight: 61.7 kg 62.8 kg 62.8 kg    Examination:  GENERAL: Frail looking elderly male.  No  apparent distress. HEENT: MMM.  Vision and hearing grossly intact.  RESP:  No IWOB.   CVS:  RRR. Heart sounds normal.  ABD/GI/GU: BS+. Abd soft, NTND.  MSK/EXT:  Moves extremities.  Significant muscle mass and subcu fat loss. SKIN: no apparent skin lesion or wound NEURO: Awake and alert. Oriented appropriately.  No apparent focal neuro deficit. PSYCH: Calm. Normal affect.   Procedures:  R/LHC on 10/18 Critical ostial left  main stenosis - heavily calcified Moderate proximal LAD and LCx stenosis also heavily calcified Severe aortic stenosis. Mean gradient 28 mm Hg with AVA 0.72 cm squared, index 0.41 Elevated LV filling pressures - mean PCWP 24 mm Hg Mild pulmonary HTN. Mean PAP 33 mm Hg Low cardiac output 3.49 L/min with index 1.97.  Microbiology summarized: COVID-19 and influenza PCR nonreactive. Full RVP panel negative. Blood cultures negative.  Assessment & Plan: End-of-life care/full comfort care -Appreciate help by palliative medicine -Palliative pathway for full comfort care. -Trial of p.o. Protonix for abdominal pain although this could be from uremia -Increase senna to twice daily -Transfer to [facility] once bed available.  Acute respiratory failure with hypoxia-due to pulmonary edema, pneumonia and non-STEMI.  Reportedly desaturated to 60% when EMS arrived.  -Continue supplemental oxygen as needed for comfort  Acute on chronic systolic CHF/severe aortic stenosis: TTE with LVEF of 40 to 45%, R WMA, G1 DD and moderate aortic valve stenosis. R/LHC as above.  Appears euvolemic on exam but multiorgan  failure with hypotension, AKI, liver failure and lactic acidosis likely from hypoperfusion.  Non-STEMI/CAD: R/LHC as above.  Off IV heparin.  Not interested in high risk procedures.  Severe sepsis in the setting of possible aspiration pneumonia: Patient with leukocytosis, significant lactic acidosis, AKI and respiratory failure.  CXR with bibasilar air space disease raising  concern for possible pneumonia in addition to pulmonary edema from CHF. Patient is not interested in inotropic support, ICU care or escalation of therapies.  -IV Unasyn 10/24-10/27  AKI/uremia: Likely a combination of cardiorenal and hepatorenal syndrome.  Elevated liver enzymes/hyperbilirubinemia-could be congestive hepatopathy.  Progressive.  Lactic acidosis-could be from sepsis, hypoperfusion and AKI.  Continue antibiotics for sepsis.  Limited option for low cardiac output/hypoperfusion  Leukocytosis: Could be due to sepsis or leukemoid reaction.  Hyponatremia: Stable  Abdominal pain-likely due to constipation, uremia or hypoperfusion.  -Bowel regimen and Protonix as above.  Body mass index is 22.7 kg/m.         DVT prophylaxis:  Patient is full comfort care  Code Status: DNR/DNI Family Communication: Updated patient's friend at bedside. Level of care: Telemetry Cardiac Status is: Inpatient  Remains inpatient appropriate because: Safe disposition/residential hospice       Consultants:  Cardiology-signed off Palliative medicine   Sch Meds:  Scheduled Meds:  pantoprazole (PROTONIX) IV  40 mg Intravenous Q24H   senna  2 tablet Oral BID   sodium chloride flush  3 mL Intravenous Q12H   Continuous Infusions:  sodium chloride 10 mL/hr at 10/20/21 0646   PRN Meds:.sodium chloride, acetaminophen **OR** acetaminophen, alum & mag hydroxide-simeth, antiseptic oral rinse, glycopyrrolate **OR** glycopyrrolate **OR** glycopyrrolate, haloperidol **OR** haloperidol **OR** haloperidol lactate, HYDROmorphone (DILAUDID) injection, LORazepam **OR** LORazepam **OR** LORazepam, nitroGLYCERIN, ondansetron (ZOFRAN) IV, polyvinyl alcohol, prochlorperazine, simethicone, sodium phosphate  Antimicrobials: Anti-infectives (From admission, onward)    Start     Dose/Rate Route Frequency Ordered Stop   10/17/21 1830  Ampicillin-Sulbactam (UNASYN) 3 g in sodium chloride 0.9 % 100 mL IVPB   Status:  Discontinued        3 g 200 mL/hr over 30 Minutes Intravenous Every 12 hours 10/17/21 1739 10/20/21 0956   10/12/21 1100  amoxicillin-clavulanate (AUGMENTIN) 875-125 MG per tablet 1 tablet        1 tablet Oral Every 12 hours 10/12/21 1001 10/14/21 2012   10/11/21 0800  vancomycin (VANCOCIN) IVPB 1000 mg/200 mL premix  Status:  Discontinued        1,000 mg 200 mL/hr over 60 Minutes Intravenous Every 36 hours 10/09/21 1919 10/10/21 1157   10/10/21 2200  ceFEPIme (MAXIPIME) 2 g in sodium chloride 0.9 % 100 mL IVPB  Status:  Discontinued        2 g 200 mL/hr over 30 Minutes Intravenous Every 12 hours 10/10/21 1157 10/12/21 1001   10/10/21 2000  vancomycin (VANCOCIN) IVPB 1000 mg/200 mL premix  Status:  Discontinued        1,000 mg 200 mL/hr over 60 Minutes Intravenous Every 24 hours 10/10/21 1157 10/10/21 1345   10/09/21 2300  ceFEPIme (MAXIPIME) 2 g in sodium chloride 0.9 % 100 mL IVPB  Status:  Discontinued        2 g 200 mL/hr over 30 Minutes Intravenous Every 24 hours 10/09/21 2150 10/10/21 1157   10/09/21 1815  vancomycin (VANCOREADY) IVPB 1250 mg/250 mL        1,250 mg 166.7 mL/hr over 90 Minutes Intravenous  Once 10/09/21 1812 10/09/21 2105   10/09/21 1745  cefTRIAXone (ROCEPHIN)  1 g in sodium chloride 0.9 % 100 mL IVPB        1 g 200 mL/hr over 30 Minutes Intravenous  Once 10/09/21 1741 10/09/21 1920   10/09/21 1745  azithromycin (ZITHROMAX) 500 mg in sodium chloride 0.9 % 250 mL IVPB        500 mg 250 mL/hr over 60 Minutes Intravenous  Once 10/09/21 1741 10/09/21 2105        I have personally reviewed the following labs and images: CBC: Recent Labs  Lab 10/17/21 0422 10/17/21 0942 10/18/21 0355 10/19/21 0403 10/20/21 0436  WBC 42.3* 33.0* 30.7* 26.0* 28.1*  NEUTROABS  --  29.0*  --   --   --   HGB 13.4 12.8* 12.7* 12.8* 12.7*  HCT 39.9 38.6* 36.6* 37.5* 37.3*  MCV 93.0 93.7 90.6 91.5 90.5  PLT 346 334 339 371 398   BMP &GFR Recent Labs  Lab  10/16/21 0251 10/17/21 0422 10/18/21 0355 10/19/21 0403 10/20/21 0436  NA 136 132* 130* 132* 131*  K 3.6 4.0 3.9 4.7 5.2*  CL 102 100 97* 97* 94*  CO2 22 20* 23 21* 21*  GLUCOSE 110* 194* 168* 142* 140*  BUN 24* 41* 54* 80* 111*  CREATININE 0.79 1.35* 1.35* 2.31* 3.23*  CALCIUM 8.9 8.7* 9.1 9.0 8.8*  MG  --   --   --   --  3.1*  PHOS  --   --   --   --  8.6*   Estimated Creatinine Clearance: 12.7 mL/min (A) (by C-G formula based on SCr of 3.23 mg/dL (H)). Liver & Pancreas: Recent Labs  Lab 10/18/21 1313 10/20/21 0436  AST 224* 563*  ALT 198* 784*  ALKPHOS 51 112  BILITOT 1.7* 1.6*  PROT 6.9 6.9  ALBUMIN 2.8* 2.8*  2.7*   No results for input(s): LIPASE, AMYLASE in the last 168 hours. No results for input(s): AMMONIA in the last 168 hours. Diabetic: No results for input(s): HGBA1C in the last 72 hours. No results for input(s): GLUCAP in the last 168 hours.  Cardiac Enzymes: Recent Labs  Lab 10/20/21 0436  CKTOTAL 115   No results for input(s): PROBNP in the last 8760 hours. Coagulation Profile: No results for input(s): INR, PROTIME in the last 168 hours. Thyroid Function Tests: No results for input(s): TSH, T4TOTAL, FREET4, T3FREE, THYROIDAB in the last 72 hours. Lipid Profile: No results for input(s): CHOL, HDL, LDLCALC, TRIG, CHOLHDL, LDLDIRECT in the last 72 hours. Anemia Panel: No results for input(s): VITAMINB12, FOLATE, FERRITIN, TIBC, IRON, RETICCTPCT in the last 72 hours. Urine analysis:    Component Value Date/Time   COLORURINE STRAW (A) 06/21/2018 1546   APPEARANCEUR CLEAR 06/21/2018 1546   LABSPEC 1.006 06/21/2018 1546   PHURINE 8.0 06/21/2018 1546   GLUCOSEU NEGATIVE 06/21/2018 1546   HGBUR LARGE (A) 06/21/2018 1546   BILIRUBINUR NEGATIVE 06/21/2018 1546   BILIRUBINUR neg 05/13/2014 1906   KETONESUR NEGATIVE 06/21/2018 1546   PROTEINUR NEGATIVE 06/21/2018 1546   UROBILINOGEN 1.0 05/13/2014 1906   UROBILINOGEN 1.0 07/11/2013 0214   NITRITE  NEGATIVE 06/21/2018 1546   LEUKOCYTESUR NEGATIVE 06/21/2018 1546   Sepsis Labs: Invalid input(s): PROCALCITONIN, [place]  Microbiology: No results found for this or any previous visit (from the past 240 hour(s)).   Radiology Studies: No results found.  Torrion Witter T. [place]  If 7PM-7AM, please contact night-coverage [email or web address] 10/22/2021, 6:00 PM

## 2021-10-22 NOTE — Plan of Care (Signed)

## 2021-10-23 DIAGNOSIS — N19 Unspecified kidney failure: Secondary | ICD-10-CM | POA: Diagnosis not present

## 2021-10-23 DIAGNOSIS — J9601 Acute respiratory failure with hypoxia: Secondary | ICD-10-CM | POA: Diagnosis not present

## 2021-10-23 DIAGNOSIS — I214 Non-ST elevation (NSTEMI) myocardial infarction: Secondary | ICD-10-CM | POA: Diagnosis not present

## 2021-10-23 DIAGNOSIS — J189 Pneumonia, unspecified organism: Secondary | ICD-10-CM | POA: Diagnosis not present

## 2021-10-23 DIAGNOSIS — Z515 Encounter for palliative care: Secondary | ICD-10-CM | POA: Diagnosis not present

## 2021-10-23 DIAGNOSIS — N179 Acute kidney failure, unspecified: Secondary | ICD-10-CM | POA: Diagnosis not present

## 2021-10-23 NOTE — Progress Notes (Signed)
PROGRESS NOTE  Nicholas Finley NWG:[id number] DOB: [birth year]   PCP: [name], MD  Patient is from: Home.  DOA: 10/09/2021 LOS: 53  Chief complaints:  Chief Complaint  Patient presents with   Respiratory Distress     Brief Narrative / Interim history: 87 year old M with PMH of HTN and HLD presenting with chest pain, shortness of breath and hypoxemia to 60% on room air, and admitted for acute respiratory failure with hypoxia in the setting of non-STEMI, acute systolic CHF with severe aortic stenosis, possible aspiration pneumonia and acute kidney injury.  Patient underwent left heart catheterization that showed critical left OM stenosis, moderate proximal LAD and circumflex stenosis and severe aortic stenosis.  Palliative medicine consulted.  CODE STATUS changed to DNR/DNI.  Plan was to discharge home with palliative care follow-up.  However, patient continued to decline with progressive AKI with uremia, acute hepatitis and significant lactic acidosis to 5.7.  Eventually, he was transitioned to full comfort care on 10/20/2021.  Waiting on bed at [facility]  Subjective: Seen and examined earlier this morning.  No major events overnight of this morning.  No complaints.  Talked to his cousin over the phone   Objective: Vitals:   10/21/21 1953 10/22/21 1007 10/22/21 1933 10/23/21 0840  BP: 107/65 99/61 107/65 (!) 82/55  Pulse: 93 90 93 (!) 101  Resp: 20 18 18 18   Temp: (!) 97.5 F (36.4 C) 97.6 F (36.4 C) (!) 97.5 F (36.4 C) 97.8 F (36.6 C)  TempSrc: Oral Axillary Oral Oral  SpO2: 94% 99% 94%   Weight:        Intake/Output Summary (Last 24 hours) at 10/23/2021 1324 Last data filed at 10/23/2021 0839 Gross per 24 hour  Intake 420 ml  Output 975 ml  Net -555 ml   Filed Weights   10/19/21 0530 10/20/21 0100 10/21/21 0101  Weight: 61.7 kg 62.8 kg 62.8 kg    Examination:  GENERAL: Frail looking elderly male.  No apparent distress. HEENT: MMM.  Vision and  hearing grossly intact.  NECK: Supple.  No apparent JVD.  RESP:  No IWOB.  Fair aeration bilaterally. CVS:  RRR. Heart sounds normal.  ABD/GI/GU: BS+. Abd soft, NTND.  MSK/EXT:  Moves extremities.  Significant muscle mass and subcu fat loss. SKIN: no apparent skin lesion or wound NEURO: Awake and alert.  Fairly.  No apparent focal neuro deficit. PSYCH: Calm. Normal affect.   Procedures:  R/LHC on 10/18 Critical ostial left  main stenosis - heavily calcified Moderate proximal LAD and LCx stenosis also heavily calcified Severe aortic stenosis. Mean gradient 28 mm Hg with AVA 0.72 cm squared, index 0.41 Elevated LV filling pressures - mean PCWP 24 mm Hg Mild pulmonary HTN. Mean PAP 33 mm Hg Low cardiac output 3.49 L/min with index 1.97.  Microbiology summarized: COVID-19 and influenza PCR nonreactive. Full RVP panel negative. Blood cultures negative.  Assessment & Plan: End-of-life care/full comfort care -Appreciate help by palliative medicine -Palliative pathway for full comfort care. -Continue Protonix and senna -Transfer to [facility] once bed available.  Acute respiratory failure with hypoxia-due to pulmonary edema, pneumonia and non-STEMI.  Reportedly desaturated to 60% when EMS arrived.  -Continue supplemental oxygen as needed for comfort  Acute on chronic systolic CHF/severe aortic stenosis: TTE with LVEF of 40 to 45%, R WMA, G1 DD and moderate aortic valve stenosis. R/LHC as above.  Appears euvolemic on exam but multiorgan failure with hypotension, AKI, liver failure and lactic acidosis likely from hypoperfusion.  Non-STEMI/CAD: R/LHC as above.  Off IV heparin.  Not interested in high risk procedures.  Severe sepsis in the setting of possible aspiration pneumonia: Patient with leukocytosis, significant lactic acidosis, AKI and respiratory failure.  CXR with bibasilar air space disease raising concern for possible pneumonia in addition to pulmonary edema from CHF. Patient  is not interested in inotropic support, ICU care or escalation of therapies.  -IV Unasyn 10/24-10/27  AKI/uremia: Likely a combination of cardiorenal and hepatorenal syndrome.  Elevated liver enzymes/hyperbilirubinemia-could be congestive hepatopathy.  Progressive.  Lactic acidosis-could be from sepsis, hypoperfusion and AKI.  Continue antibiotics for sepsis.  Limited option for low cardiac output/hypoperfusion  Leukocytosis: Could be due to sepsis or leukemoid reaction.  Hyponatremia: Stable  Abdominal pain-likely due to constipation, uremia or hypoperfusion.  -Bowel regimen and Protonix as above.  Body mass index is 22.7 kg/m.         DVT prophylaxis:  Patient is full comfort care  Code Status: DNR/DNI Family Communication: Updated patient's cousin over the phone Level of care: Telemetry Cardiac Status is: Inpatient  Remains inpatient appropriate because: Safe disposition/residential hospice       Consultants:  Cardiology-signed off Palliative medicine   Sch Meds:  Scheduled Meds:  pantoprazole (PROTONIX) IV  40 mg Intravenous Q24H   senna  2 tablet Oral BID   sodium chloride flush  3 mL Intravenous Q12H   Continuous Infusions:  sodium chloride 10 mL/hr at 10/20/21 0646   PRN Meds:.sodium chloride, acetaminophen **OR** acetaminophen, alum & mag hydroxide-simeth, antiseptic oral rinse, glycopyrrolate **OR** glycopyrrolate **OR** glycopyrrolate, haloperidol **OR** haloperidol **OR** haloperidol lactate, HYDROmorphone (DILAUDID) injection, LORazepam **OR** LORazepam **OR** LORazepam, nitroGLYCERIN, ondansetron (ZOFRAN) IV, polyvinyl alcohol, prochlorperazine, simethicone, sodium phosphate  Antimicrobials: Anti-infectives (From admission, onward)    Start     Dose/Rate Route Frequency Ordered Stop   10/17/21 1830  Ampicillin-Sulbactam (UNASYN) 3 g in sodium chloride 0.9 % 100 mL IVPB  Status:  Discontinued        3 g 200 mL/hr over 30 Minutes Intravenous Every  12 hours 10/17/21 1739 10/20/21 0956   10/12/21 1100  amoxicillin-clavulanate (AUGMENTIN) 875-125 MG per tablet 1 tablet        1 tablet Oral Every 12 hours 10/12/21 1001 10/14/21 2012   10/11/21 0800  vancomycin (VANCOCIN) IVPB 1000 mg/200 mL premix  Status:  Discontinued        1,000 mg 200 mL/hr over 60 Minutes Intravenous Every 36 hours 10/09/21 1919 10/10/21 1157   10/10/21 2200  ceFEPIme (MAXIPIME) 2 g in sodium chloride 0.9 % 100 mL IVPB  Status:  Discontinued        2 g 200 mL/hr over 30 Minutes Intravenous Every 12 hours 10/10/21 1157 10/12/21 1001   10/10/21 2000  vancomycin (VANCOCIN) IVPB 1000 mg/200 mL premix  Status:  Discontinued        1,000 mg 200 mL/hr over 60 Minutes Intravenous Every 24 hours 10/10/21 1157 10/10/21 1345   10/09/21 2300  ceFEPIme (MAXIPIME) 2 g in sodium chloride 0.9 % 100 mL IVPB  Status:  Discontinued        2 g 200 mL/hr over 30 Minutes Intravenous Every 24 hours 10/09/21 2150 10/10/21 1157   10/09/21 1815  vancomycin (VANCOREADY) IVPB 1250 mg/250 mL        1,250 mg 166.7 mL/hr over 90 Minutes Intravenous  Once 10/09/21 1812 10/09/21 2105   10/09/21 1745  cefTRIAXone (ROCEPHIN) 1 g in sodium chloride 0.9 % 100 mL IVPB  1 g 200 mL/hr over 30 Minutes Intravenous  Once 10/09/21 1741 10/09/21 1920   10/09/21 1745  azithromycin (ZITHROMAX) 500 mg in sodium chloride 0.9 % 250 mL IVPB        500 mg 250 mL/hr over 60 Minutes Intravenous  Once 10/09/21 1741 10/09/21 2105        I have personally reviewed the following labs and images: CBC: Recent Labs  Lab 10/17/21 0422 10/17/21 0942 10/18/21 0355 10/19/21 0403 10/20/21 0436  WBC 42.3* 33.0* 30.7* 26.0* 28.1*  NEUTROABS  --  29.0*  --   --   --   HGB 13.4 12.8* 12.7* 12.8* 12.7*  HCT 39.9 38.6* 36.6* 37.5* 37.3*  MCV 93.0 93.7 90.6 91.5 90.5  PLT 346 334 339 371 398   BMP &GFR Recent Labs  Lab 10/17/21 0422 10/18/21 0355 10/19/21 0403 10/20/21 0436  NA 132* 130* 132* 131*  K 4.0  3.9 4.7 5.2*  CL 100 97* 97* 94*  CO2 20* 23 21* 21*  GLUCOSE 194* 168* 142* 140*  BUN 41* 54* 80* 111*  CREATININE 1.35* 1.35* 2.31* 3.23*  CALCIUM 8.7* 9.1 9.0 8.8*  MG  --   --   --  3.1*  PHOS  --   --   --  8.6*   Estimated Creatinine Clearance: 12.7 mL/min (A) (by C-G formula based on SCr of 3.23 mg/dL (H)). Liver & Pancreas: Recent Labs  Lab 10/18/21 1313 10/20/21 0436  AST 224* 563*  ALT 198* 784*  ALKPHOS 51 112  BILITOT 1.7* 1.6*  PROT 6.9 6.9  ALBUMIN 2.8* 2.8*  2.7*   No results for input(s): LIPASE, AMYLASE in the last 168 hours. No results for input(s): AMMONIA in the last 168 hours. Diabetic: No results for input(s): HGBA1C in the last 72 hours. No results for input(s): GLUCAP in the last 168 hours.  Cardiac Enzymes: Recent Labs  Lab 10/20/21 0436  CKTOTAL 115   No results for input(s): PROBNP in the last 8760 hours. Coagulation Profile: No results for input(s): INR, PROTIME in the last 168 hours. Thyroid Function Tests: No results for input(s): TSH, T4TOTAL, FREET4, T3FREE, THYROIDAB in the last 72 hours. Lipid Profile: No results for input(s): CHOL, HDL, LDLCALC, TRIG, CHOLHDL, LDLDIRECT in the last 72 hours. Anemia Panel: No results for input(s): VITAMINB12, FOLATE, FERRITIN, TIBC, IRON, RETICCTPCT in the last 72 hours. Urine analysis:    Component Value Date/Time   COLORURINE STRAW (A) 06/21/2018 1546   APPEARANCEUR CLEAR 06/21/2018 1546   LABSPEC 1.006 06/21/2018 1546   PHURINE 8.0 06/21/2018 1546   GLUCOSEU NEGATIVE 06/21/2018 1546   HGBUR LARGE (A) 06/21/2018 1546   BILIRUBINUR NEGATIVE 06/21/2018 1546   BILIRUBINUR neg 05/13/2014 1906   KETONESUR NEGATIVE 06/21/2018 1546   PROTEINUR NEGATIVE 06/21/2018 1546   UROBILINOGEN 1.0 05/13/2014 1906   UROBILINOGEN 1.0 07/11/2013 0214   NITRITE NEGATIVE 06/21/2018 1546   LEUKOCYTESUR NEGATIVE 06/21/2018 1546   Sepsis Labs: Invalid input(s): PROCALCITONIN, [place]  Microbiology: No  results found for this or any previous visit (from the past 240 hour(s)).   Radiology Studies: No results found.  Vernadette Stutsman T. [place]  If 7PM-7AM, please contact night-coverage [email or web address] 10/23/2021, 1:24 PM

## 2021-10-23 NOTE — Progress Notes (Signed)
[facility]) Hospital Liaison Note   Unfortunately, [facility] is not able to offer a room today. Family and [facility] Manager aware hospital liaison will follow up tomorrow or sooner if a room becomes available.    Please do not hesitate to call with any hospice related questions.    Thank you for the opportunity to participate in this patient's care.   Bobbie "Loren Racer, RN, BSN [facility] Liaison [phone]

## 2021-10-23 NOTE — Plan of Care (Signed)

## 2021-10-23 NOTE — Progress Notes (Signed)
Daily Progress Note   Patient Name: Nicholas Finley       Date: 10/23/2021 DOB: 10-Jun-1926  Age: 87 y.o. MRN#: [id number] Attending Physician: Mercy Riding, MD Primary Care Physician: [name], MD Admit Date: 10/09/2021  Reason for Consultation/Follow-up: end of life care, symptom management  Subjective: Patient appears comfortable. He is alert and currently enjoying a visit from one of his friends/neighbors. He denies pain. He does report nausea. I reminded him that he has medication available as needed to help relieve nausea. He reports not eating the past 2 days, only taking ice chips and sips of liquids.    I spoke with his friend/Susan by phone to provide a brief update. Discussed that he is awaiting a bed at [facility]. Education and counseling provided on expectations at EOL. [name] expresses appreciation for the call.     Length of Stay: 14  Physical Exam Vitals reviewed.  Constitutional:      Appearance: He is ill-appearing.  Pulmonary:     Effort: Pulmonary effort is normal.  Neurological:     Mental Status: He is alert and oriented to person, place, and time.     Motor: Weakness present.            Vital Signs: BP (!) 82/55 (BP Location: Right Arm)   Pulse (!) 101   Temp 97.8 F (36.6 C) (Oral)   Resp 18   Wt 62.8 kg   SpO2 94%   BMI 22.70 kg/m  SpO2: SpO2: 94 % O2 Device: O2 Device: Nasal Cannula O2 Flow Rate: O2 Flow Rate (L/min): 3 L/min  Intake/output summary:  Intake/Output Summary (Last 24 hours) at 10/23/2021 1710 Last data filed at 10/23/2021 1457 Gross per 24 hour  Intake 540 ml  Output 975 ml  Net -435 ml        Palliative Assessment/Data: PS 20%      Palliative Care Assessment & Plan   Patient Profile: 87 y.o. male  with  past medical history of HTN, HLD admitted on 10/09/2021 with chest pain and shortness of breath and found to have NSTEMI, severe aortic stenosis, EF 40%. He has opted for conservative medical management.    Assessment: Terminal care Acute respiratory failure with hypoxia Non-STEMI/CAD Severe sepsis secondary to aspiration pneumonia AKI Elevated liver enzymes  Recommendations/Plan: Continue  full comfort care Continue compazine 10 mg IV q6h prn for nausea/vomiting PRN medications are available for symptom management to ensure comfort at EOL PMT will continue to support  Goals of Care and Additional Recommendations: Limitations on Scope of Treatment: Full Comfort Care  Code Status: DNR/DNI  Prognosis:  < 2 weeks  Discharge Planning: Hospice facility   Thank you for allowing the Palliative Medicine Team to assist in the care of this patient.   Total Time 15 minutes Prolonged Time Billed  no       Greater than 50%  of this time was spent counseling and coordinating care related to the above assessment and plan.  Lavena Bullion, NP  Please contact Palliative Medicine Team phone at [phone] for questions and concerns.

## 2021-10-24 NOTE — Care Management Important Message (Signed)
Important Message  Patient Details  Name: Nicholas Finley MRN: [id number] Date of Birth: [birth year]   Medicare Important Message Given:  Yes     Shelda Altes 10/24/2021, 10:50 AM

## 2021-10-24 NOTE — Progress Notes (Signed)
[facility]) Hospital Liaison Note   Unfortunately, [facility] is not able to offer a room today. Family and [facility] Manager aware hospital liaison will follow up tomorrow or sooner if a room becomes available.    Please do not hesitate to call with any hospice related questions.    Thank you for the opportunity to participate in this patient's care.   Bobbie "Loren Racer, RN, BSN [facility] Liaison [phone]

## 2021-10-24 NOTE — Progress Notes (Signed)
PROGRESS NOTE  Nicholas Finley HBZ:[id number] DOB: [birth year]   PCP: [name], MD  Patient is from: Home.  DOA: 10/09/2021 LOS: 67  Chief complaints:  Chief Complaint  Patient presents with   Respiratory Distress     Brief Narrative / Interim history: 87 year old M with PMH of HTN and HLD presenting with chest pain, shortness of breath and hypoxemia to 60% on room air, and admitted for acute respiratory failure with hypoxia in the setting of non-STEMI, acute systolic CHF with severe aortic stenosis, possible aspiration pneumonia and acute kidney injury.  Patient underwent left heart catheterization that showed critical left OM stenosis, moderate proximal LAD and circumflex stenosis and severe aortic stenosis.  Palliative medicine consulted.  CODE STATUS changed to DNR/DNI.  Plan was to discharge home with palliative care follow-up.  However, patient continued to decline with progressive AKI with uremia, acute hepatitis and significant lactic acidosis to 5.7.  Eventually, he was transitioned to full comfort care on 10/20/2021.  Waiting on bed at [facility]  Subjective: Seen and examined earlier this morning.  No major events overnight of this morning.  No specific complaints.  Looks comfortable.  Objective: Vitals:   10/22/21 1933 10/23/21 0840 10/24/21 0004 10/24/21 1042  BP: 107/65 (!) 82/55 (!) 85/60 (!) 87/58  Pulse: 93 (!) 101 87 88  Resp: 18 18 16    Temp: (!) 97.5 F (36.4 C) 97.8 F (36.6 C) (!) 97.5 F (36.4 C) 97.6 F (36.4 C)  TempSrc: Oral Oral Oral   SpO2: 94%  99% 99%  Weight:        Intake/Output Summary (Last 24 hours) at 10/24/2021 1443 Last data filed at 10/24/2021 0951 Gross per 24 hour  Intake 243 ml  Output 1225 ml  Net -982 ml   Filed Weights   10/19/21 0530 10/20/21 0100 10/21/21 0101  Weight: 61.7 kg 62.8 kg 62.8 kg    Examination:  GENERAL: Frail looking elderly male.  No apparent distress. HEENT: MMM.  Vision and hearing  grossly intact.  NECK: Supple.  No apparent JVD.  RESP: 99% on 3 L.  No IWOB.  Fair aeration bilaterally. MSK/EXT:  Moves extremities.  Significant muscle mass and subcu fat loss. SKIN: no apparent skin lesion or wound NEURO: Awake and alert. Oriented fairly.  No apparent focal neuro deficit. PSYCH: Calm. Normal affect.   Procedures:  R/LHC on 10/18 Critical ostial left  main stenosis - heavily calcified Moderate proximal LAD and LCx stenosis also heavily calcified Severe aortic stenosis. Mean gradient 28 mm Hg with AVA 0.72 cm squared, index 0.41 Elevated LV filling pressures - mean PCWP 24 mm Hg Mild pulmonary HTN. Mean PAP 33 mm Hg Low cardiac output 3.49 L/min with index 1.97.  Microbiology summarized: COVID-19 and influenza PCR nonreactive. Full RVP panel negative. Blood cultures negative.  Assessment & Plan: End-of-life care/full comfort care -Appreciate help by palliative medicine -Palliative pathway for full comfort care. -Continue Protonix and senna for abdominal discomfort -Transfer to [facility] once bed available.  Acute respiratory failure with hypoxia-due to pulmonary edema, pneumonia and non-STEMI.  Reportedly desaturated to 60% when EMS arrived.  -Continue supplemental oxygen as needed for comfort  Acute on chronic systolic CHF/severe aortic stenosis: TTE with LVEF of 40 to 45%, R WMA, G1 DD and moderate aortic valve stenosis. R/LHC as above.  Appears euvolemic on exam but multiorgan failure with hypotension, AKI, liver failure and lactic acidosis likely from hypoperfusion.  Non-STEMI/CAD: R/LHC as above.  Off IV heparin.  Not interested in high risk procedures.  Severe sepsis in the setting of possible aspiration pneumonia: Patient with leukocytosis, significant lactic acidosis, AKI and respiratory failure.  CXR with bibasilar air space disease raising concern for possible pneumonia in addition to pulmonary edema from CHF. Patient is not interested in inotropic  support, ICU care or escalation of therapies.  -IV Unasyn 10/24-10/27  AKI/uremia: Likely a combination of cardiorenal and hepatorenal syndrome.  Elevated liver enzymes/hyperbilirubinemia-could be congestive hepatopathy.  Progressive.  Lactic acidosis-could be from sepsis, hypoperfusion and AKI.  Continue antibiotics for sepsis.  Limited option for low cardiac output/hypoperfusion  Leukocytosis: Could be due to sepsis or leukemoid reaction.  Hyponatremia: Stable  Abdominal pain-likely due to constipation, uremia or hypoperfusion.  -Bowel regimen and Protonix as above.  Body mass index is 22.7 kg/m.         DVT prophylaxis:  Patient is full comfort care  Code Status: DNR/DNI Family Communication: Updated patient's cousin over the phone on 10/30.  None at bedside today. Level of care: Telemetry Cardiac Status is: Inpatient  Remains inpatient appropriate because: Safe disposition/residential hospice       Consultants:  Cardiology-signed off Palliative medicine   Sch Meds:  Scheduled Meds:  pantoprazole (PROTONIX) IV  40 mg Intravenous Q24H   senna  2 tablet Oral BID   sodium chloride flush  3 mL Intravenous Q12H   Continuous Infusions:  sodium chloride 10 mL/hr at 10/20/21 0646   PRN Meds:.sodium chloride, acetaminophen **OR** acetaminophen, alum & mag hydroxide-simeth, antiseptic oral rinse, glycopyrrolate **OR** glycopyrrolate **OR** glycopyrrolate, haloperidol **OR** haloperidol **OR** haloperidol lactate, HYDROmorphone (DILAUDID) injection, LORazepam **OR** LORazepam **OR** LORazepam, nitroGLYCERIN, ondansetron (ZOFRAN) IV, polyvinyl alcohol, prochlorperazine, simethicone, sodium phosphate  Antimicrobials: Anti-infectives (From admission, onward)    Start     Dose/Rate Route Frequency Ordered Stop   10/17/21 1830  Ampicillin-Sulbactam (UNASYN) 3 g in sodium chloride 0.9 % 100 mL IVPB  Status:  Discontinued        3 g 200 mL/hr over 30 Minutes Intravenous  Every 12 hours 10/17/21 1739 10/20/21 0956   10/12/21 1100  amoxicillin-clavulanate (AUGMENTIN) 875-125 MG per tablet 1 tablet        1 tablet Oral Every 12 hours 10/12/21 1001 10/14/21 2012   10/11/21 0800  vancomycin (VANCOCIN) IVPB 1000 mg/200 mL premix  Status:  Discontinued        1,000 mg 200 mL/hr over 60 Minutes Intravenous Every 36 hours 10/09/21 1919 10/10/21 1157   10/10/21 2200  ceFEPIme (MAXIPIME) 2 g in sodium chloride 0.9 % 100 mL IVPB  Status:  Discontinued        2 g 200 mL/hr over 30 Minutes Intravenous Every 12 hours 10/10/21 1157 10/12/21 1001   10/10/21 2000  vancomycin (VANCOCIN) IVPB 1000 mg/200 mL premix  Status:  Discontinued        1,000 mg 200 mL/hr over 60 Minutes Intravenous Every 24 hours 10/10/21 1157 10/10/21 1345   10/09/21 2300  ceFEPIme (MAXIPIME) 2 g in sodium chloride 0.9 % 100 mL IVPB  Status:  Discontinued        2 g 200 mL/hr over 30 Minutes Intravenous Every 24 hours 10/09/21 2150 10/10/21 1157   10/09/21 1815  vancomycin (VANCOREADY) IVPB 1250 mg/250 mL        1,250 mg 166.7 mL/hr over 90 Minutes Intravenous  Once 10/09/21 1812 10/09/21 2105   10/09/21 1745  cefTRIAXone (ROCEPHIN) 1 g in sodium chloride 0.9 % 100 mL IVPB  1 g 200 mL/hr over 30 Minutes Intravenous  Once 10/09/21 1741 10/09/21 1920   10/09/21 1745  azithromycin (ZITHROMAX) 500 mg in sodium chloride 0.9 % 250 mL IVPB        500 mg 250 mL/hr over 60 Minutes Intravenous  Once 10/09/21 1741 10/09/21 2105        I have personally reviewed the following labs and images: CBC: Recent Labs  Lab 10/18/21 0355 10/19/21 0403 10/20/21 0436  WBC 30.7* 26.0* 28.1*  HGB 12.7* 12.8* 12.7*  HCT 36.6* 37.5* 37.3*  MCV 90.6 91.5 90.5  PLT 339 371 398   BMP &GFR Recent Labs  Lab 10/18/21 0355 10/19/21 0403 10/20/21 0436  NA 130* 132* 131*  K 3.9 4.7 5.2*  CL 97* 97* 94*  CO2 23 21* 21*  GLUCOSE 168* 142* 140*  BUN 54* 80* 111*  CREATININE 1.35* 2.31* 3.23*  CALCIUM 9.1  9.0 8.8*  MG  --   --  3.1*  PHOS  --   --  8.6*   Estimated Creatinine Clearance: 12.7 mL/min (A) (by C-G formula based on SCr of 3.23 mg/dL (H)). Liver & Pancreas: Recent Labs  Lab 10/18/21 1313 10/20/21 0436  AST 224* 563*  ALT 198* 784*  ALKPHOS 51 112  BILITOT 1.7* 1.6*  PROT 6.9 6.9  ALBUMIN 2.8* 2.8*  2.7*   No results for input(s): LIPASE, AMYLASE in the last 168 hours. No results for input(s): AMMONIA in the last 168 hours. Diabetic: No results for input(s): HGBA1C in the last 72 hours. No results for input(s): GLUCAP in the last 168 hours.  Cardiac Enzymes: Recent Labs  Lab 10/20/21 0436  CKTOTAL 115   No results for input(s): PROBNP in the last 8760 hours. Coagulation Profile: No results for input(s): INR, PROTIME in the last 168 hours. Thyroid Function Tests: No results for input(s): TSH, T4TOTAL, FREET4, T3FREE, THYROIDAB in the last 72 hours. Lipid Profile: No results for input(s): CHOL, HDL, LDLCALC, TRIG, CHOLHDL, LDLDIRECT in the last 72 hours. Anemia Panel: No results for input(s): VITAMINB12, FOLATE, FERRITIN, TIBC, IRON, RETICCTPCT in the last 72 hours. Urine analysis:    Component Value Date/Time   COLORURINE STRAW (A) 06/21/2018 1546   APPEARANCEUR CLEAR 06/21/2018 1546   LABSPEC 1.006 06/21/2018 1546   PHURINE 8.0 06/21/2018 1546   GLUCOSEU NEGATIVE 06/21/2018 1546   HGBUR LARGE (A) 06/21/2018 1546   BILIRUBINUR NEGATIVE 06/21/2018 1546   BILIRUBINUR neg 05/13/2014 1906   KETONESUR NEGATIVE 06/21/2018 1546   PROTEINUR NEGATIVE 06/21/2018 1546   UROBILINOGEN 1.0 05/13/2014 1906   UROBILINOGEN 1.0 07/11/2013 0214   NITRITE NEGATIVE 06/21/2018 1546   LEUKOCYTESUR NEGATIVE 06/21/2018 1546   Sepsis Labs: Invalid input(s): PROCALCITONIN, [place]  Microbiology: No results found for this or any previous visit (from the past 240 hour(s)).   Radiology Studies: No results found.  Eyden Dobie T. [place]  If 7PM-7AM, please  contact night-coverage [email or web address] 10/24/2021, 2:43 PM

## 2021-10-25 MED ORDER — SENNA 8.6 MG PO TABS: 2.0000 | ORAL_TABLET | Freq: Two times a day (BID) | ORAL | 0 refills | Status: AC

## 2021-10-25 MED ORDER — HYDROMORPHONE HCL 1 MG/ML IJ SOLN: 0.5000 mg | INTRAMUSCULAR | 0 refills | Status: AC | PRN

## 2021-10-25 MED ORDER — LORAZEPAM 1 MG PO TABS: 1.0000 mg | ORAL_TABLET | ORAL | 0 refills | Status: AC | PRN

## 2021-10-25 MED ORDER — ONDANSETRON HCL 4 MG/2ML IJ SOLN: 4.0000 mg | Freq: Four times a day (QID) | INTRAMUSCULAR | 0 refills | Status: AC | PRN

## 2021-10-25 MED ORDER — NITROGLYCERIN 0.4 MG SL SUBL: 0.4000 mg | SUBLINGUAL_TABLET | SUBLINGUAL | 12 refills | Status: AC | PRN

## 2021-10-25 MED ORDER — GLYCOPYRROLATE 1 MG PO TABS: 1.0000 mg | ORAL_TABLET | ORAL | Status: AC | PRN

## 2021-10-25 NOTE — TOC Transition Note (Signed)
Transition of Care [facility]) - CM/SW Discharge Note   Patient Details  Name: Nicholas Finley MRN: [id number] Date of Birth: 1926-11-04  Transition of Care [employer]) CM/SW Contact:  Tresa Endo Phone Number: 10/25/2021, 4:31 PM   Clinical Narrative:    Patient will DC to: [facility] Anticipated DC date: 10/25/2021 Family notified: Pt friend  Transport by: Corey Harold   Per MD patient ready for DC to [facility]. RN to call report prior to discharge [phone]). RN, patient, patient's family, and facility notified of DC. Discharge Summary and FL2 sent to facility. DC packet on chart. Ambulance transport requested for patient.   CSW will sign off for now as social work intervention is no longer needed. Please consult [place] again if new needs arise.     Final next level of care: [place] Barriers to Discharge: Continued Medical Work up   Patient Goals and CMS Choice Patient states their goals for this hospitalization and ongoing recovery are:: return home with [facility] CMS Medicare.gov Compare Post Acute Care list provided to:: Patient Choice offered to / list presented to : Patient  Discharge Placement                       Discharge Plan and Services   Discharge Planning Services: CM Consult Post Acute Care Choice: Home Health          DME Arranged:  (Patient already has a cane.) DME Agency: NA       HH Arranged: RN, PT, OT, Nurse's Aide, Social Work [employer] Agency: [place] Date [place]: 10/17/21 [place]: [id number] Representative spoke with at [place]: [place] Interventions     Readmission Risk Interventions No flowsheet data found.

## 2021-10-25 NOTE — Plan of Care (Signed)

## 2021-10-25 NOTE — Discharge Summary (Signed)
Physician Discharge Summary  Nicholas Finley FYB:[id number] DOB: 11-Aug-1926 DOA: 10/09/2021  PCP: [name], MD  Admit date: 10/09/2021 Discharge date: 10/25/2021 Admitted From: Home Disposition: Residential hospice at [facility]  Discharge Condition: Stable for transfer.  Poor prognosis. CODE STATUS: DNR/DNI   Hospital Course: 87 year old M with PMH of HTN and HLD presenting with chest pain, shortness of breath and hypoxemia to 60% on room air, and admitted for acute respiratory failure with hypoxia in the setting of non-STEMI, acute systolic CHF with severe aortic stenosis, possible aspiration pneumonia and acute kidney injury.  Patient underwent left heart catheterization that showed critical left OM stenosis, moderate proximal LAD and circumflex stenosis and severe aortic stenosis.  Palliative medicine consulted.  CODE STATUS changed to DNR/DNI.  Plan was to discharge home with palliative care follow-up.  However, patient continued to decline with progressive AKI with uremia, acute hepatitis and significant lactic acidosis to 5.7.  Eventually, he was transitioned to full comfort care on 10/20/2021.  Transferred to residential hospice at [facility] on 10/25/2021.  See individual problem list below for more on hospital course.  Discharge Diagnoses:  End-of-life care/full comfort care -Appreciate help by palliative medicine -Palliative pathway for full comfort care. -Transfer to [facility] today   Acute respiratory failure with hypoxia-due to pulmonary edema, pneumonia and non-STEMI.  Reportedly desaturated to 60% when EMS arrived.  -Continue supplemental oxygen as needed for comfort   Acute on chronic systolic CHF/severe aortic stenosis: TTE with LVEF of 40 to 45%, R WMA, G1 DD and moderate aortic valve stenosis. R/LHC as above.  Appears euvolemic on exam but multiorgan failure with hypotension, AKI, liver failure and lactic acidosis likely from hypoperfusion.    Non-STEMI/CAD: R/LHC as above.  Off IV heparin.  Not interested in high risk procedures.   Severe sepsis in the setting of possible aspiration pneumonia: Patient with leukocytosis, significant lactic acidosis, AKI and respiratory failure.  CXR with bibasilar air space disease raising concern for possible pneumonia in addition to pulmonary edema from CHF. Patient is not interested in inotropic support, ICU care or escalation of therapies.  -IV Unasyn 10/24-10/27   AKI/uremia: Likely a combination of cardiorenal and hepatorenal syndrome.   Elevated liver enzymes/hyperbilirubinemia-could be congestive hepatopathy.  Progressive.   Lactic acidosis-could be from sepsis, hypoperfusion and AKI.  Continue antibiotics for sepsis.  Limited option for low cardiac output/hypoperfusion   Leukocytosis: Could be due to sepsis or leukemoid reaction.   Hyponatremia: Stable   Abdominal pain-likely due to constipation, uremia or hypoperfusion.  -Bowel regimen and Protonix as above.   Body mass index is 22.7 kg/m.           Discharge Exam: Vitals:   10/24/21 0004 10/24/21 1042 10/24/21 2124 10/25/21 1059  BP: (!) 85/60 (!) 87/58 (!) 90/58 102/63  Pulse: 87 88 91 92  Temp: (!) 97.5 F (36.4 C) 97.6 F (36.4 C)  (!) 97.4 F (36.3 C)  Resp: 16  (!) 22   Weight:      SpO2: 99% 99% 98% 96%  TempSrc: Oral   Oral     GENERAL: Frail looking elderly male.  Looks weak.  No apparent distress. HEENT: MMM.  Vision and hearing grossly intact.  RESP: 96% on 4 L.  No IWOB.  CVS:  RRR. Heart sounds normal.  ABD/GI/GU: Bowel sounds present. Soft. Non tender.  MSK/EXT:  Moves extremities. No apparent deformity. No edema.  SKIN: no apparent skin lesion or wound NEURO: Awake.  Oriented  fairly.  No apparent focal neuro deficit. PSYCH: Calm.  No distress or agitation.  Discharge Instructions   Allergies as of 10/25/2021   No Known Allergies      Medication List     STOP taking these medications     aspirin 81 MG tablet   atorvastatin 10 MG tablet Commonly known as: LIPITOR   Azelastine HCl 0.15 % Soln Commonly known as: Astepro   lisinopril 10 MG tablet Commonly known as: ZESTRIL   PRESERVISION AREDS 2 PO   tetrahydrozoline-zinc 0.05-0.25 % ophthalmic solution Commonly known as: VISINE-AC       TAKE these medications    glycopyrrolate 1 MG tablet Commonly known as: ROBINUL Take 1 tablet (1 mg total) by mouth every 4 (four) hours as needed (excessive secretions).   HYDROmorphone 1 MG/ML injection Commonly known as: DILAUDID Inject 0.5-1 mLs (0.5-1 mg total) into the vein every 3 (three) hours as needed for severe pain (sob).   LORazepam 1 MG tablet Commonly known as: ATIVAN Take 1 tablet (1 mg total) by mouth every 4 (four) hours as needed for anxiety.   nitroGLYCERIN 0.4 MG SL tablet Commonly known as: NITROSTAT Place 1 tablet (0.4 mg total) under the tongue every 5 (five) minutes as needed for chest pain.   ondansetron 4 MG/2ML Soln injection Commonly known as: ZOFRAN Inject 2 mLs (4 mg total) into the vein every 6 (six) hours as needed for nausea or vomiting.   senna 8.6 MG Tabs tablet Commonly known as: SENOKOT Take 2 tablets (17.2 mg total) by mouth 2 (two) times daily.        Consultations: Cardiology Palliative medicine  Procedures/Studies: [facility] on 10/18 Critical ostial left  main stenosis - heavily calcified Moderate proximal LAD and LCx stenosis also heavily calcified Severe aortic stenosis. Mean gradient 28 mm Hg with AVA 0.72 cm squared, index 0.41 Elevated LV filling pressures - mean PCWP 24 mm Hg Mild pulmonary HTN. Mean PAP 33 mm Hg Low cardiac output 3.49 L/min with index 1.97.   DG Abd 1 View  Result Date: 10/18/2021 CLINICAL DATA:  Abdominal pain, nausea, constipation EXAM: ABDOMEN - 1 VIEW COMPARISON:  07/11/2013 FINDINGS: The bowel gas pattern is normal. Moderate amount of stool within the rectosigmoid colon. Aortic  atherosclerosis. No radio-opaque calculi or other significant radiographic abnormality are seen. IMPRESSION: Nonobstructive bowel gas pattern. Moderate amount of stool within the rectosigmoid colon. Electronically Signed   By: Davina Poke D.O.   On: 10/18/2021 10:51   CARDIAC CATHETERIZATION  Result Date: 10/11/2021   Ost LM lesion is 95% stenosed.   Prox LAD to Mid LAD lesion is 70% stenosed.   Ost Cx to Prox Cx lesion is 80% stenosed.   LV end diastolic pressure is mildly elevated.   Hemodynamic findings consistent with mild pulmonary hypertension.   There is severe aortic valve stenosis. Critical ostial left  main stenosis - heavily calcified Moderate proximal LAD and LCx stenosis also heavily calcified Severe aortic stenosis. Mean gradient 28 mm Hg with AVA 0.72 cm squared, index 0.41 Elevated LV filling pressures - mean PCWP 24 mm Hg Mild pulmonary HTN. Mean PAP 33 mm Hg Low cardiac output 3.49 L/min with index 1.97. Plan: patient has very high risk anatomy with critical ostial Left main stenosis and severe AS. Will continue IV diuresis. Add inotropic support with IV milrinone. Will need heart team approach to review possible treatment options.   DG CHEST PORT 1 VIEW  Result Date: 10/17/2021 CLINICAL DATA:  Leukocytosis EXAM: PORTABLE CHEST 1 VIEW COMPARISON:  10/12/2021, 10/11/2021, 10/10/2021, 10/09/2021 FINDINGS: Cardiomegaly with vascular congestion and pulmonary edema. Slight worsening of pleural effusions and basilar airspace disease. Aortic atherosclerosis. No pneumothorax. IMPRESSION: Cardiomegaly with vascular congestion and pulmonary edema. Interval worsening of pleural effusions and basilar airspace disease since prior exam. Electronically Signed   By: Donavan Foil M.D.   On: 10/17/2021 16:54   DG Chest Port 1 View  Result Date: 10/12/2021 CLINICAL DATA:  Pulmonary edema ,confusion ,hx resp distress EXAM: PORTABLE CHEST - 1 VIEW COMPARISON:  the previous day's study FINDINGS:  Improving perihilar opacities with only mild interstitial residual. Some improvement in the left retrocardiac consolidation. Patchy airspace disease in the right infrahilar region has slightly increased. Heart size within normal limits. Aortic Atherosclerosis (ICD10-170.0). No pneumothorax.  Possible small pleural effusions. Visualized bones unremarkable. IMPRESSION: Partial improvement in asymmetric pulmonary edema/infiltrates. Suspect small pleural effusions. Electronically Signed   By: Lucrezia Europe M.D.   On: 10/12/2021 06:38   DG Chest Port 1 View  Result Date: 10/11/2021 CLINICAL DATA:  Pulmonary edema, confusion and shortness of breath this morning EXAM: PORTABLE CHEST 1 VIEW COMPARISON:  Chest radiograph dated 1 day prior FINDINGS: The heart is enlarged, unchanged. The mediastinal contours are stable. Again seen are patchy perihilar opacities in both lungs likely reflecting moderate pulmonary interstitial edema. Aeration of the left base has worsened with slight interval enlargement of a left pleural effusion. There is probably a trace right pleural effusion. There is no pneumothorax. There is no acute osseous abnormality. IMPRESSION: Worsening aeration of the left base with slight interval enlargement of a small left pleural effusion. Perihilar opacities are not significantly changed. Findings again likely reflect moderate pulmonary interstitial edema, though superimposed infection cannot be excluded. Electronically Signed   By: Valetta Mole M.D.   On: 10/11/2021 08:32   DG Chest Port 1 View  Result Date: 10/10/2021 CLINICAL DATA:  Shortness of breath EXAM: PORTABLE CHEST 1 VIEW COMPARISON:  Chest radiograph 1 day prior FINDINGS: The cardiomediastinal silhouette is stable, with unchanged calcified atherosclerotic plaque of the aortic arch. Patchy airspace disease is again seen throughout both lungs. Opacity in the medial right base has worsened since the study from 1 day prior. Aeration of the lungs  is otherwise not significantly changed. There is no significant pleural effusion. There is no pneumothorax. The bones are stable. IMPRESSION: Worsened aeration in the medial right base since the study from 1 day prior. Patchy airspace disease throughout the remainder of the lungs is otherwise not significantly changed. Findings may reflect infection or pulmonary edema. Electronically Signed   By: Valetta Mole M.D.   On: 10/10/2021 08:24   DG Chest Port 1 View  Result Date: 10/09/2021 CLINICAL DATA:  Shortness of breath, respiratory distress EXAM: PORTABLE CHEST 1 VIEW COMPARISON:  05/13/2014 FINDINGS: Airspace disease throughout both lungs, most pronounced in the upper lobes bilaterally. Heart is normal size. No effusions or acute bony abnormality. IMPRESSION: Bilateral airspace disease, most pronounced in the upper lobes. Favor infection. Electronically Signed   By: Rolm Baptise M.D.   On: 10/09/2021 17:17   ECHOCARDIOGRAM COMPLETE  Result Date: 10/10/2021    ECHOCARDIOGRAM REPORT   Patient Name:   Nicholas Finley Date of Exam: 10/10/2021 Medical Rec #:  [id number]         Height:       65.5 in Accession #:    [id number]        Weight:  149.0 lb Date of Birth:  04/16/1926         BSA:          1.755 m Patient Age:    13 years          BP:           129/69 mmHg Patient Gender: M                 HR:           75 bpm. Exam Location:  Inpatient Procedure: 2D Echo, Cardiac Doppler, Color Doppler and Intracardiac            Opacification Agent Indications:    NSTEMI  History:        Patient has no prior history of Echocardiogram examinations.                 Signs/Symptoms:Cerebellar hemmorhage; Risk Factors:Hypertension.  Sonographer:    Merrie Roof RDCS Referring Phys: [id number] [place]  1. Left ventricular ejection fraction, by estimation, is 40 to 45%. The left ventricle has mildly decreased function. The left ventricle demonstrates regional wall motion abnormalities (see scoring  diagram/findings for description). Left ventricular diastolic parameters are consistent with Grade I diastolic dysfunction (impaired relaxation).  2. Right ventricular systolic function is normal. The right ventricular size is normal.  3. The mitral valve is normal in structure. No evidence of mitral valve regurgitation. No evidence of mitral stenosis.  4. The aortic valve is calcified. There is moderate calcification of the aortic valve. There is moderate thickening of the aortic valve. Aortic valve regurgitation is moderate. Moderate aortic valve stenosis. Aortic valve mean gradient measures 25.2 mmHg. Aortic valve Vmax measures 3.16 m/s.  5. The inferior vena cava is dilated in size with <50% respiratory variability, suggesting right atrial pressure of 15 mmHg. FINDINGS  Left Ventricle: Left ventricular ejection fraction, by estimation, is 40 to 45%. The left ventricle has mildly decreased function. The left ventricle demonstrates regional wall motion abnormalities. Definity contrast agent was given IV to delineate the left ventricular endocardial borders. The left ventricular internal cavity size was normal in size. There is no left ventricular hypertrophy. Left ventricular diastolic parameters are consistent with Grade I diastolic dysfunction (impaired relaxation).  LV Wall Scoring: The apical lateral segment, mid inferoseptal segment, apical septal segment, and apex are akinetic. Right Ventricle: The right ventricular size is normal. No increase in right ventricular wall thickness. Right ventricular systolic function is normal. Left Atrium: Left atrial size was normal in size. Right Atrium: Right atrial size was normal in size. Pericardium: There is no evidence of pericardial effusion. Mitral Valve: The mitral valve is normal in structure. No evidence of mitral valve regurgitation. No evidence of mitral valve stenosis. Tricuspid Valve: The tricuspid valve is normal in structure. Tricuspid valve regurgitation  is not demonstrated. No evidence of tricuspid stenosis. Aortic Valve: The aortic valve is calcified. There is moderate calcification of the aortic valve. There is moderate thickening of the aortic valve. Aortic valve regurgitation is moderate. Moderate aortic stenosis is present. Aortic valve mean gradient measures 25.2 mmHg. Aortic valve peak gradient measures 40.1 mmHg. Aortic valve area, by VTI measures 0.67 cm. Pulmonic Valve: The pulmonic valve was normal in structure. Pulmonic valve regurgitation is not visualized. No evidence of pulmonic stenosis. Aorta: The aortic root is normal in size and structure. Venous: The inferior vena cava is dilated in size with less than 50% respiratory variability, suggesting right atrial pressure of  15 mmHg. IAS/Shunts: No atrial level shunt detected by color flow Doppler.  LEFT VENTRICLE PLAX 2D LVIDd:         6.70 cm   Diastology LVIDs:         4.20 cm   LV e' medial:   3.26 cm/s LV PW:         1.10 cm   LV E/e' medial: 14.7 LV IVS:        1.00 cm LVOT diam:     2.20 cm LV SV:         48 LV SV Index:   27 LVOT Area:     3.80 cm  RIGHT VENTRICLE             IVC RV Basal diam:  2.80 cm     IVC diam: 2.20 cm RV S prime:     11.40 cm/s TAPSE (M-mode): 2.0 cm LEFT ATRIUM           Index        RIGHT ATRIUM           Index LA diam:      3.80 cm 2.16 cm/m   RA Area:     13.50 cm LA Vol (A2C): 43.6 ml 24.84 ml/m  RA Volume:   33.80 ml  19.25 ml/m LA Vol (A4C): 58.1 ml 33.10 ml/m  AORTIC VALVE AV Area (Vmax):    0.79 cm AV Area (Vmean):   0.68 cm AV Area (VTI):     0.67 cm AV Vmax:           316.50 cm/s AV Vmean:          241.000 cm/s AV VTI:            0.712 m AV Peak Grad:      40.1 mmHg AV Mean Grad:      25.2 mmHg LVOT Vmax:         65.70 cm/s LVOT Vmean:        42.900 cm/s LVOT VTI:          0.125 m LVOT/AV VTI ratio: 0.18  AORTA Ao Root diam: 3.10 cm MITRAL VALVE MV Area (PHT): 3.60 cm     SHUNTS MV Decel Time: 211 msec     Systemic VTI:  0.12 m MV E velocity: 48.00  cm/s   Systemic Diam: 2.20 cm MV A velocity: 109.00 cm/s MV E/A ratio:  0.44 Candee Furbish MD Electronically signed by Candee Furbish MD Signature Date/Time: 10/10/2021/1:27:53 PM    Final    VAS [place] LOWER EXTREMITY VENOUS (DVT)  Result Date: 10/10/2021  Lower Venous DVT Study Patient Name:  CLAUD GOWAN  Date of Exam:   10/10/2021 Medical Rec #: [id number]          Accession #:    [id number] Date of Birth: 07/06/1926          Patient Gender: M Patient Age:   35 years Exam [facility]  [facility] Procedure:      VAS [place] LOWER EXTREMITY VENOUS (DVT) Referring Phys: Elease Etienne --------------------------------------------------------------------------------  Indications: Pain.  Comparison Study: no prior Performing Technologist: Archie Patten RVS  Examination Guidelines: A complete evaluation includes B-mode imaging, spectral Doppler, color Doppler, and power Doppler as needed of all accessible portions of each vessel. Bilateral testing is considered an integral part of a complete examination. Limited examinations for reoccurring indications may be performed as noted. The reflux portion of the exam is performed with the patient in  reverse Trendelenburg.  +---------+---------------+---------+-----------+----------+--------------+ RIGHT    CompressibilityPhasicitySpontaneityPropertiesThrombus Aging +---------+---------------+---------+-----------+----------+--------------+ CFV      Full           Yes      Yes                                 +---------+---------------+---------+-----------+----------+--------------+ SFJ      Full                                                        +---------+---------------+---------+-----------+----------+--------------+ FV Prox  Full                                                        +---------+---------------+---------+-----------+----------+--------------+ FV Mid   Full                                                         +---------+---------------+---------+-----------+----------+--------------+ FV DistalFull                                                        +---------+---------------+---------+-----------+----------+--------------+ PFV      Full                                                        +---------+---------------+---------+-----------+----------+--------------+ POP      Full           Yes      Yes                                 +---------+---------------+---------+-----------+----------+--------------+ PTV      Full                                                        +---------+---------------+---------+-----------+----------+--------------+ PERO     Full                                                        +---------+---------------+---------+-----------+----------+--------------+   +---------+---------------+---------+-----------+----------+--------------+ LEFT     CompressibilityPhasicitySpontaneityPropertiesThrombus Aging +---------+---------------+---------+-----------+----------+--------------+ CFV      Full           Yes      Yes                                 +---------+---------------+---------+-----------+----------+--------------+  SFJ      Full                                                        +---------+---------------+---------+-----------+----------+--------------+ FV Prox  Full                                                        +---------+---------------+---------+-----------+----------+--------------+ FV Mid   Full                                                        +---------+---------------+---------+-----------+----------+--------------+ FV DistalFull                                                        +---------+---------------+---------+-----------+----------+--------------+ PFV      Full                                                         +---------+---------------+---------+-----------+----------+--------------+ POP      Full           Yes      Yes                                 +---------+---------------+---------+-----------+----------+--------------+ PTV      Full                                                        +---------+---------------+---------+-----------+----------+--------------+ PERO     Full                                                        +---------+---------------+---------+-----------+----------+--------------+     Summary: BILATERAL: - No evidence of deep vein thrombosis seen in the lower extremities, bilaterally. -No evidence of popliteal cyst, bilaterally.   *See table(s) above for measurements and observations. Electronically signed by Servando Snare MD on 10/10/2021 at 4:34:52 PM.    Final        The results of significant diagnostics from this hospitalization (including imaging, microbiology, ancillary and laboratory) are listed below for reference.     Microbiology: No results found for this or any previous visit (from the past 240 hour(s)).   Labs:  CBC: Recent Labs  Lab 10/19/21 0403 10/20/21 0436  WBC 26.0* 28.1*  HGB 12.8* 12.7*  HCT 37.5* 37.3*  MCV 91.5 90.5  PLT 371 398   BMP &GFR Recent Labs  Lab 10/19/21 0403 10/20/21 0436  NA 132* 131*  K 4.7 5.2*  CL 97* 94*  CO2 21* 21*  GLUCOSE 142* 140*  BUN 80* 111*  CREATININE 2.31* 3.23*  CALCIUM 9.0 8.8*  MG  --  3.1*  PHOS  --  8.6*   Estimated Creatinine Clearance: 12.7 mL/min (A) (by C-G formula based on SCr of 3.23 mg/dL (H)). Liver & Pancreas: Recent Labs  Lab 10/18/21 1313 10/20/21 0436  AST 224* 563*  ALT 198* 784*  ALKPHOS 51 112  BILITOT 1.7* 1.6*  PROT 6.9 6.9  ALBUMIN 2.8* 2.8*  2.7*   No results for input(s): LIPASE, AMYLASE in the last 168 hours. No results for input(s): AMMONIA in the last 168 hours. Diabetic: No results for input(s): HGBA1C in the last 72 hours. No results  for input(s): GLUCAP in the last 168 hours. Cardiac Enzymes: Recent Labs  Lab 10/20/21 0436  CKTOTAL 115   No results for input(s): PROBNP in the last 8760 hours. Coagulation Profile: No results for input(s): INR, PROTIME in the last 168 hours. Thyroid Function Tests: No results for input(s): TSH, T4TOTAL, FREET4, T3FREE, THYROIDAB in the last 72 hours. Lipid Profile: No results for input(s): CHOL, HDL, LDLCALC, TRIG, CHOLHDL, LDLDIRECT in the last 72 hours. Anemia Panel: No results for input(s): VITAMINB12, FOLATE, FERRITIN, TIBC, IRON, RETICCTPCT in the last 72 hours. Urine analysis:    Component Value Date/Time   COLORURINE STRAW (A) 06/21/2018 1546   APPEARANCEUR CLEAR 06/21/2018 1546   LABSPEC 1.006 06/21/2018 1546   PHURINE 8.0 06/21/2018 1546   GLUCOSEU NEGATIVE 06/21/2018 1546   HGBUR LARGE (A) 06/21/2018 1546   BILIRUBINUR NEGATIVE 06/21/2018 1546   BILIRUBINUR neg 05/13/2014 1906   KETONESUR NEGATIVE 06/21/2018 1546   PROTEINUR NEGATIVE 06/21/2018 1546   UROBILINOGEN 1.0 05/13/2014 1906   UROBILINOGEN 1.0 07/11/2013 0214   NITRITE NEGATIVE 06/21/2018 1546   LEUKOCYTESUR NEGATIVE 06/21/2018 1546   Sepsis Labs: Invalid input(s): PROCALCITONIN, LACTICIDVEN   Time coordinating discharge: 45 minutes  SIGNED:  Mercy Riding, MD  [facility] 10/25/2021, 11:23 AM

## 2021-10-25 NOTE — TOC Progression Note (Signed)
Transition of Care [facility]) - Progression Note    Patient Details  Name: Nicholas Finley MRN: [id number] Date of Birth: 04/02/1926  [facility] CM/SW Contact  [name], RN Phone Number: 10/25/2021, 10:06 AM  Clinical Narrative:    Per Velta Addison with [facility], she will know if they have a bed today for patient or not in a few minutes.  She will contact this NCM back.    Expected Discharge Plan: [place] Barriers to Discharge: Continued Medical Work up  Expected Discharge Plan and Services Expected Discharge Plan: [place]   Discharge Planning Services: CM Consult Post Acute Care Choice: [place] arrangements for the past 2 months: Single Family Home Expected Discharge Date: 10/25/21               DME Arranged:  (Patient already has a cane.) DME Agency: NA       HH Arranged: RN, PT, OT, Nurse's Aide, Social Work [employer] Agency: [place] Date [place]: 10/17/21 Time [place]: 1658 Representative spoke with at [place]: [place] ([place]) Interventions    Readmission Risk Interventions No flowsheet data found.

## 2021-10-25 NOTE — Progress Notes (Signed)
Manufacturing engineer [facility]) Hospital Liaison note.    Chart reviewed and eligibility confirmed for [facility]. Spoke with family to confirm interest and explain services. Family agreeable to transfer today. TOC aware.    [facility] will notify TOC when registration paperwork has been completed to arrange transport.   RN please call report to [phone].  Please be sure a signed DNR form transports with the patient.  Thank you for the opportunity to participate in  this patient's care.  Domenic Moras, BSN, RN [facility] Liaison (listed on [place] under [facility])    [phone] (24h on call)

## 2021-10-25 NOTE — Progress Notes (Deleted)
Cardiology Office Note:    Date:  10/25/2021   ID:  Ardine Eng, DOB 07/21/1926, MRN [id number]  PCP:  [name], MD   [facility] Providers Cardiologist:  Larae Grooms, MD { Click to update primary MD,subspecialty MD or APP then REFRESH:1}  *** Referring MD: [name], MD   Chief Complaint:  No chief complaint on file. {Click here for Visit Info    :1}   Patient Profile:   Nicholas Finley is a 87 y.o. male with:  Coronary artery disease  NSTEMI 09/2021 >> Cath with critical LM stenosis C/b acute low OP HFmrEF  Pt is not a candidate for PCI or CABG >> Palliative Care Severe aortic stenosis  HFmrEF (heart failure with mildly reduced ejection fraction)  Echocardiogram 10/22: EF 40-45 Hx of TIA Hypertension  Hyperlipidemia   History of Present Illness: Mr. Bollig was admitted 10/16-11/1 with hypoxic respiratory failure in the setting of severe aortic stenosis and NSTEMI c/b acute HFmrEF.  Cardiac catheterization demonstrated critical LM stenosis.  He is felt to be at prohibitive risk for PCI or open heart surgery and palliative care was recommended.  He is DNR/DNI.  His status continued to decline in the hospital due to AKI with uremia, acute hepatitis, lactic acidosis.  He was made full comfort care and was DC'd to residential Hospice at [facility].  ***  ASSESSMENT & PLAN:   No problem-specific Assessment & Plan notes found for this encounter.        {Are you ordering a CV Procedure (e.g. stress test, cath, DCCV, TEE, etc)?   Press F2        :[id number]}   Dispo:  No follow-ups on file.    Prior CV studies: Cardiac catheterization 10/16/21 LM ost 95 LAD prox 70 LCx ost 80 Mild pulmo hypertension (mean PAP 33 mmHg) Severe AS (mean 28 mmHg, AVA 0.72 cm2, index 0.41) Low CO (3.49 L/min   Echocardiogram 10/10/21 EF 40-45, Gr 1 DD, apical lat/inf-sept/apical septal and apical AK, normal RVSF, mod AI, mod AS (mean 25.2 mmHg, Vmax 315.50  cm/s, DI 0.18)  {Select studies to display:26339}    Past Medical History:  Diagnosis Date   Allergy    Cataract    Chest pain 10/09/2021   Enlarged prostate    Heart murmur    Hypercholesteremia    Hypertension    Sepsis with acute hypoxic respiratory failure and septic shock ([place]) 10/09/2021   Current Medications: No outpatient medications have been marked as taking for the 10/26/21 encounter (Appointment) with Richardson Dopp T, PA-C.    Allergies:   Patient has no known allergies.   Social History   Tobacco Use   Smoking status: Some Days    Types: Cigarettes, Pipe, Cigars   Smokeless tobacco: Never   Tobacco comments:    cigars   Vaping Use   Vaping Use: Never used  Substance Use Topics   Alcohol use: Yes    Alcohol/week: 0.0 standard drinks    Comment: occas beer   Drug use: No    Family Hx: The patient's family history includes Heart Problems in his sister; Heart failure in his father.  ROS   EKGs/Labs/Other Test Reviewed:    EKG:  EKG is *** ordered today.  The ekg ordered today demonstrates ***  Recent Labs: 10/20/2021: ALT 784; B Natriuretic Peptide 2,693.0; BUN 111; Creatinine, Ser 3.23; Hemoglobin 12.7; Magnesium 3.1; Platelets 398; Potassium 5.2; Sodium 131   Recent Lipid Panel Lab Results  Component Value Date/Time   CHOL 128 10/11/2021 07:46 AM   CHOL 147 03/16/2021 09:59 AM   TRIG 83 10/11/2021 07:46 AM   HDL 56 10/11/2021 07:46 AM   HDL 66 03/16/2021 09:59 AM   LDLCALC 55 10/11/2021 07:46 AM   LDLCALC 69 03/16/2021 09:59 AM     Risk Assessment/Calculations:   {Does this patient have ATRIAL FIBRILLATION?[phone]}      Physical Exam:    VS:  There were no vitals taken for this visit.    Wt Readings from Last 3 Encounters:  10/21/21 138 lb 8 oz (62.8 kg)  09/07/21 149 lb (67.6 kg)  03/09/21 148 lb (67.1 kg)    Physical Exam ***    Medication Adjustments/Labs and Tests Ordered: Current medicines are reviewed at length with the  patient today.  Concerns regarding medicines are outlined above.  Tests Ordered: No orders of the defined types were placed in this encounter.  Medication Changes: No orders of the defined types were placed in this encounter.  Signed, Richardson Dopp, PA-C  10/25/2021 9:59 PM    [facility] [address], [place], [place]  [address] Phone: [phone]; Fax: [phone]

## 2021-10-26 ENCOUNTER — Ambulatory Visit: Payer: Medicare Other | Admitting: Physician Assistant

## 2021-10-26 NOTE — Telephone Encounter (Signed)
**Note De-Identified  Obfuscation** The pt was discharged from [facility] yesterday and went to a [facility]. No TCM call required.

## 2021-11-24 DEATH — deceased

## 2021-12-02 NOTE — Telephone Encounter (Signed)
Pt now on hospice care and follow up visit with [facility] has been cancelled.  No further needs.

## 2022-03-08 ENCOUNTER — Ambulatory Visit: Payer: Medicare Other | Admitting: Family Medicine

## 2022-05-02 ENCOUNTER — Telehealth: Payer: Self-pay | Admitting: Family Medicine

## 2022-05-02 NOTE — Telephone Encounter (Signed)
I was going to call patient to schedule AWV. I looked patient up in [place] and discovered he passed away on Nov 26, 2021. ?
